# Patient Record
Sex: Male | Born: 1935 | Race: White | Hispanic: No | Smoking: Never smoker
Health system: Southern US, Community
[De-identification: ages and names within clinical notes are randomized; demographics above are authoritative.]

## PROBLEM LIST (undated history)

## (undated) DIAGNOSIS — M109 Gout, unspecified: Secondary | ICD-10-CM

## (undated) DIAGNOSIS — I10 Essential (primary) hypertension: Secondary | ICD-10-CM

## (undated) DIAGNOSIS — Z8709 Personal history of other diseases of the respiratory system: Secondary | ICD-10-CM

## (undated) DIAGNOSIS — I779 Disorder of arteries and arterioles, unspecified: Secondary | ICD-10-CM

## (undated) DIAGNOSIS — I714 Abdominal aortic aneurysm, without rupture: Secondary | ICD-10-CM

## (undated) DIAGNOSIS — Z9889 Other specified postprocedural states: Secondary | ICD-10-CM

## (undated) DIAGNOSIS — Z951 Presence of aortocoronary bypass graft: Secondary | ICD-10-CM

## (undated) DIAGNOSIS — J189 Pneumonia, unspecified organism: Secondary | ICD-10-CM

## (undated) DIAGNOSIS — I2510 Atherosclerotic heart disease of native coronary artery without angina pectoris: Secondary | ICD-10-CM

## (undated) DIAGNOSIS — Z8679 Personal history of other diseases of the circulatory system: Secondary | ICD-10-CM

## (undated) DIAGNOSIS — I739 Peripheral vascular disease, unspecified: Secondary | ICD-10-CM

## (undated) DIAGNOSIS — E785 Hyperlipidemia, unspecified: Secondary | ICD-10-CM

## (undated) DIAGNOSIS — R931 Abnormal findings on diagnostic imaging of heart and coronary circulation: Secondary | ICD-10-CM

## (undated) DIAGNOSIS — N189 Chronic kidney disease, unspecified: Secondary | ICD-10-CM

## (undated) HISTORY — DX: Hyperlipidemia, unspecified: E78.5

## (undated) HISTORY — DX: Personal history of other diseases of the circulatory system: Z86.79

## (undated) HISTORY — DX: Pneumonia, unspecified organism: J18.9

## (undated) HISTORY — DX: Abdominal aortic aneurysm, without rupture: I71.4

## (undated) HISTORY — DX: Peripheral vascular disease, unspecified: I73.9

## (undated) HISTORY — DX: Presence of aortocoronary bypass graft: Z95.1

## (undated) HISTORY — DX: Atherosclerotic heart disease of native coronary artery without angina pectoris: I25.10

## (undated) HISTORY — DX: Gout, unspecified: M10.9

## (undated) HISTORY — DX: Disorder of arteries and arterioles, unspecified: I77.9

## (undated) HISTORY — DX: Other specified postprocedural states: Z98.890

## (undated) HISTORY — DX: Essential (primary) hypertension: I10

## (undated) HISTORY — DX: Abnormal findings on diagnostic imaging of heart and coronary circulation: R93.1

## (undated) HISTORY — DX: Personal history of other diseases of the respiratory system: Z87.09

---

## 1999-03-04 ENCOUNTER — Encounter: Payer: Self-pay | Admitting: Emergency Medicine

## 1999-03-04 ENCOUNTER — Inpatient Hospital Stay (HOSPITAL_COMMUNITY): Admission: EM | Admit: 1999-03-04 | Discharge: 1999-03-07 | Payer: Self-pay | Admitting: Emergency Medicine

## 2001-02-02 DIAGNOSIS — Z951 Presence of aortocoronary bypass graft: Secondary | ICD-10-CM

## 2001-02-02 HISTORY — PX: CORONARY ARTERY BYPASS GRAFT: SHX141

## 2001-02-02 HISTORY — DX: Presence of aortocoronary bypass graft: Z95.1

## 2001-06-11 ENCOUNTER — Inpatient Hospital Stay (HOSPITAL_COMMUNITY): Admission: EM | Admit: 2001-06-11 | Discharge: 2001-06-19 | Payer: Self-pay

## 2001-07-13 ENCOUNTER — Encounter: Payer: Self-pay | Admitting: *Deleted

## 2001-07-15 ENCOUNTER — Ambulatory Visit (HOSPITAL_COMMUNITY): Admission: RE | Admit: 2001-07-15 | Discharge: 2001-07-15 | Payer: Self-pay | Admitting: *Deleted

## 2001-10-14 ENCOUNTER — Inpatient Hospital Stay (HOSPITAL_COMMUNITY): Admission: EM | Admit: 2001-10-14 | Discharge: 2001-10-17 | Payer: Self-pay | Admitting: Internal Medicine

## 2001-10-16 ENCOUNTER — Encounter: Payer: Self-pay | Admitting: Internal Medicine

## 2002-02-06 ENCOUNTER — Encounter: Admission: RE | Admit: 2002-02-06 | Discharge: 2002-02-06 | Payer: Self-pay | Admitting: *Deleted

## 2002-02-06 ENCOUNTER — Encounter: Payer: Self-pay | Admitting: *Deleted

## 2003-02-03 DIAGNOSIS — I714 Abdominal aortic aneurysm, without rupture, unspecified: Secondary | ICD-10-CM

## 2003-02-03 HISTORY — DX: Abdominal aortic aneurysm, without rupture: I71.4

## 2003-02-03 HISTORY — DX: Abdominal aortic aneurysm, without rupture, unspecified: I71.40

## 2003-02-03 HISTORY — PX: ABDOMINAL AORTIC ANEURYSM REPAIR: SUR1152

## 2003-03-01 ENCOUNTER — Encounter: Admission: RE | Admit: 2003-03-01 | Discharge: 2003-03-01 | Payer: Self-pay | Admitting: *Deleted

## 2003-12-27 ENCOUNTER — Ambulatory Visit: Payer: Self-pay | Admitting: Cardiology

## 2003-12-27 ENCOUNTER — Encounter (INDEPENDENT_AMBULATORY_CARE_PROVIDER_SITE_OTHER): Payer: Self-pay | Admitting: *Deleted

## 2003-12-27 ENCOUNTER — Inpatient Hospital Stay (HOSPITAL_COMMUNITY): Admission: EM | Admit: 2003-12-27 | Discharge: 2004-01-04 | Payer: Self-pay | Admitting: Emergency Medicine

## 2004-01-15 ENCOUNTER — Ambulatory Visit: Payer: Self-pay | Admitting: Cardiology

## 2004-02-26 ENCOUNTER — Ambulatory Visit: Payer: Self-pay | Admitting: Cardiology

## 2004-04-24 ENCOUNTER — Ambulatory Visit: Payer: Self-pay | Admitting: Internal Medicine

## 2004-05-22 ENCOUNTER — Ambulatory Visit: Payer: Self-pay | Admitting: Cardiology

## 2004-11-05 ENCOUNTER — Ambulatory Visit: Payer: Self-pay | Admitting: Cardiology

## 2004-11-25 ENCOUNTER — Ambulatory Visit: Payer: Self-pay

## 2005-06-12 ENCOUNTER — Emergency Department: Payer: Self-pay | Admitting: Internal Medicine

## 2005-06-12 ENCOUNTER — Other Ambulatory Visit: Payer: Self-pay

## 2006-02-12 ENCOUNTER — Ambulatory Visit: Payer: Self-pay | Admitting: Cardiology

## 2007-04-16 ENCOUNTER — Emergency Department: Payer: Self-pay | Admitting: Emergency Medicine

## 2007-04-16 ENCOUNTER — Other Ambulatory Visit: Payer: Self-pay

## 2007-06-23 ENCOUNTER — Ambulatory Visit: Payer: Self-pay | Admitting: Cardiology

## 2008-04-12 ENCOUNTER — Ambulatory Visit: Payer: Medicare Other | Admitting: Surgery

## 2008-04-25 ENCOUNTER — Ambulatory Visit: Payer: Medicare Other | Admitting: Surgery

## 2008-06-26 DIAGNOSIS — I10 Essential (primary) hypertension: Secondary | ICD-10-CM | POA: Insufficient documentation

## 2008-06-26 DIAGNOSIS — I739 Peripheral vascular disease, unspecified: Secondary | ICD-10-CM

## 2008-06-26 DIAGNOSIS — I6529 Occlusion and stenosis of unspecified carotid artery: Secondary | ICD-10-CM

## 2008-06-26 DIAGNOSIS — E785 Hyperlipidemia, unspecified: Secondary | ICD-10-CM | POA: Insufficient documentation

## 2008-07-03 ENCOUNTER — Ambulatory Visit: Payer: Self-pay | Admitting: Cardiology

## 2008-12-03 DIAGNOSIS — R931 Abnormal findings on diagnostic imaging of heart and coronary circulation: Secondary | ICD-10-CM

## 2008-12-03 HISTORY — DX: Abnormal findings on diagnostic imaging of heart and coronary circulation: R93.1

## 2008-12-16 ENCOUNTER — Inpatient Hospital Stay: Payer: Medicare Other | Admitting: Internal Medicine

## 2008-12-16 ENCOUNTER — Ambulatory Visit: Payer: Self-pay | Admitting: Cardiovascular Disease

## 2008-12-16 ENCOUNTER — Encounter: Payer: Self-pay | Admitting: Cardiology

## 2008-12-17 ENCOUNTER — Encounter: Payer: Self-pay | Admitting: Cardiology

## 2008-12-20 ENCOUNTER — Ambulatory Visit: Payer: Self-pay | Admitting: Cardiology

## 2008-12-25 ENCOUNTER — Ambulatory Visit: Payer: Medicare Other | Admitting: Vascular Surgery

## 2008-12-26 ENCOUNTER — Encounter: Payer: Self-pay | Admitting: Internal Medicine

## 2009-01-02 ENCOUNTER — Inpatient Hospital Stay: Payer: Medicare Other | Admitting: Vascular Surgery

## 2009-01-04 ENCOUNTER — Telehealth: Payer: Self-pay | Admitting: Cardiology

## 2009-08-29 ENCOUNTER — Ambulatory Visit: Payer: Self-pay | Admitting: Cardiology

## 2009-08-29 DIAGNOSIS — I498 Other specified cardiac arrhythmias: Secondary | ICD-10-CM

## 2009-08-29 DIAGNOSIS — I25810 Atherosclerosis of coronary artery bypass graft(s) without angina pectoris: Secondary | ICD-10-CM

## 2010-03-06 NOTE — Assessment & Plan Note (Signed)
Summary: 1 year follow up/past due/mt   Visit Type:  1 yr f/u Referring Provider:  Dr. Daleen Squibb Primary Provider:  Dr. Lacie Scotts  CC:  sob w/walking at times...edema/ankles at times...denies any cp.  History of Present Illness: [name] returns today for evaluation of his coronary artery disease.  Since I last saw him, he presented with a left-sided stroke. He subsequent underwent right carotid endarterectomy at [facility]. His residual disease on the left side. He is being followed by Dr.Dew.   He is having no angina or ischemic symptoms. He is very compliant with medications. He continues to be remarkably active. Primary care is following his blood work.  Current Medications (verified): 1)  Pravastatin Sodium 40 Mg Tabs (Pravastatin Sodium) .... Take One Tablet By Mouth Daily At Bedtime 2)  Hydrochlorothiazide 12.5 Mg Tabs (Hydrochlorothiazide) .... Take One Tablet By Mouth Daily. 3)  Aspirin 81 Mg Tbec (Aspirin) .... Take One Tablet By Mouth Daily 4)  Plavix 75 Mg Tabs (Clopidogrel Bisulfate) .... Take One Tablet By Mouth Daily 5)  Daily Multi  Tabs (Multiple Vitamins-Minerals) .Marland Kitchen.. 1 By Mouth Once Daily 6)  Iron 325 (65 Fe) Mg Tabs (Ferrous Sulfate) .Marland Kitchen.. 1 By Mouth Once Daily 7)  Vitamin D3 400 Unit Tabs (Cholecalciferol) .Marland Kitchen.. 1 By Mouth Once Daily 8)  Fish Oil 1000 Mg Caps (Omega-3 Fatty Acids) .Marland Kitchen.. 1 By Mouth When Remember 9)  Metoprolol Tartrate 25 Mg Tabs (Metoprolol Tartrate) .... Take 1/2 Tablet By Mouth Twice A Day 10)  Doxazosin Mesylate 4 Mg Tabs (Doxazosin Mesylate) .Marland Kitchen.. 1 Tab Once Daily 11)  Lisinopril 5 Mg Tabs (Lisinopril) .Marland Kitchen.. 1 Tab Once Daily 12)  Finasteride 5 Mg Tabs (Finasteride) .Marland Kitchen.. 1 Tab Once Daily  Allergies: No Known Drug Allergies  Past History:  Past Medical History: Last updated: 12/20/2008 1. CAD s/p CABG in 2003 at [facility].  PCI 2004.  No LHC since 2004.  2. Open AAA repair in 2005. 3. CAROTID ARTERY DISEASE (ICD-433.10): Right basal ganglia CVA  11/10.  CTA neck showed 90% RICA stenosis, 75-80% LICA stenosis.  4.  Echo (11/10): Mild LVH, EF 55%, mild AI, diastolic dysfunction.  5.  History of orthostatic hypotension.  6. HYPERTENSION, UNSPECIFIED (ICD-401.9) 7. HYPERLIPIDEMIA-MIXED (ICD-272.4)    Past Surgical History: Last updated: 08/27/2009 CABG...2003 AAA repair...2005  Family History: Last updated: 08/27/2009 Family History of Coronary Artery Disease:  Family History of Diabetes:   Social History: Last updated: 08/27/2009 Lives alone, does all housework and yardwork.  Retired from [employer].  Son lives nearby.   Tobacco Use - No.  Drug Use - no Alcohol Use - no  Risk Factors: Smoking Status: never (06/26/2008)  Review of Systems       negative other history of present illness  Vital Signs:  Patient profile:   77 year old male Height:      68 inches Weight:      182 pounds BMI:     27.77 Pulse rate:   51 / minute Pulse rhythm:   irregular BP sitting:   158 / 80  (left arm) Cuff size:   large  Vitals Entered By: Danielle Rankin, CMA (August 29, 2009 10:27 AM)  Physical Exam  General:  elderly, in no acute distress Head:  normocephalic and atraumatic Eyes:  glasses otherwise normal Neck:  Neck supple, no JVD. No masses, thyromegaly or abnormal cervical nodes. Lungs:  Clear bilaterally to auscultation and percussion. Heart:  PMI nondisplaced, regular rate and rhythm, normal S1-S2, right carotid  bruit and left carotid bruit Abdomen:  midline incision, positive bowel sounds, no bruit Msk:  decreased ROM.   Pulses:  pulses normal in all 4 extremities Extremities:  trace left pedal edema and trace right pedal edema.   Neurologic:  Alert and oriented x 3. Skin:  Intact without lesions or rashes. Psych:  Normal affect.   Problems:  Medical Problems Added: 1)  Dx of Bradycardia, Chronic  (ICD-427.89) 2)  Dx of Cad, Artery Bypass Graft  (ICD-414.04)  EKG  Procedure date:   08/29/2009  Findings:      sinus bradycardia, ST segment changes laterally, stable.  Impression & Recommendations:  Problem # 1:  CAD, ARTERY BYPASS GRAFT (ICD-414.04) Assessment Unchanged  He is stable. Medications reviewed. Symptoms of cardiac decompensation reviewed with he and his son. No changes made. I'll see him back in a year. The following medications were removed from the medication list:    Mavik 2 Mg Tabs (Trandolapril) .Marland Kitchen... 1 by mouth once daily His updated medication list for this problem includes:    Aspirin 81 Mg Tbec (Aspirin) .Marland Kitchen... Take one tablet by mouth daily    Plavix 75 Mg Tabs (Clopidogrel bisulfate) .Marland Kitchen... Take one tablet by mouth daily    Metoprolol Tartrate 25 Mg Tabs (Metoprolol tartrate) .Marland Kitchen... Take 1/2 tablet by mouth twice a day    Lisinopril 5 Mg Tabs (Lisinopril) .Marland Kitchen... 1 tab once daily  Orders: EKG w/ Interpretation (93000)  Problem # 2:  CAROTID ARTERY DISEASE (ICD-433.10)  His updated medication list for this problem includes:    Aspirin 81 Mg Tbec (Aspirin) .Marland Kitchen... Take one tablet by mouth daily    Plavix 75 Mg Tabs (Clopidogrel bisulfate) .Marland Kitchen... Take one tablet by mouth daily  Problem # 3:  PVD (ICD-443.9) Assessment: Unchanged  Problem # 4:  BRADYCARDIA, CHRONIC (ICD-427.89) Assessment: Unchanged he That is asymptomatic. We'll continue very low dose beta blocker. The following medications were removed from the medication list:    Mavik 2 Mg Tabs (Trandolapril) .Marland Kitchen... 1 by mouth once daily His updated medication list for this problem includes:    Aspirin 81 Mg Tbec (Aspirin) .Marland Kitchen... Take one tablet by mouth daily    Plavix 75 Mg Tabs (Clopidogrel bisulfate) .Marland Kitchen... Take one tablet by mouth daily    Metoprolol Tartrate 25 Mg Tabs (Metoprolol tartrate) .Marland Kitchen... Take 1/2 tablet by mouth twice a day    Lisinopril 5 Mg Tabs (Lisinopril) .Marland Kitchen... 1 tab once daily  Patient Instructions: 1)  Your physician recommends that you schedule a follow-up appointment  in: YEAR WITH DR Dartanyon Frankowski 2)  Your physician recommends that you continue on your current medications as directed. Please refer to the Current Medication list given to you today.

## 2010-06-17 NOTE — Assessment & Plan Note (Signed)
[facility] OFFICE NOTE   LAZARUS, SUDBURY                      MRN:          [id number]  DATE:07/03/2008                            DOB:          [birth year]    Mr. Halpin comes today for followup.   He last saw me on Jun 23, 2007.  Since that time, he is turned 77 year of  age and is still fairly active.  He is having no symptoms of angina or  ischemia.  He has had no symptoms of TIAs or mini strokes.  He denies  any claudication.   See problem list from Jun 23, 2007.   He recently has his carotid Doppler by Dr. Lacie Scotts, who has been  followed this on an annual basis.  He remains both on Plavix and  aspirin.   When carefully questioned, he does occasionally get a little lightheaded  when he stands up.  He has had a history of orthostatic hypotension.   His medications are listed on the maintenance medication list.  Compared  to last visit he is now off of amlodipine.  He remains on:  1. Pravastatin 40 mg p.o. daily.  2. He is now on HCTZ 12.5 mg a day.  3. Mavik 2 mg a day.  4. Plavix 75 mg a day.  5. Aspirin 81 mg a day.   REVIEW OF SYSTEMS:  Otherwise is negative.   PHYSICAL EXAMINATION:  VITAL SIGNS:  His blood pressure is 116/70.  His  pulse is 66 and regular.  He has sinus rhythm on his EKG with a left  bundle, which we saw last time.  His weight is 181.1.  HEENT:  Unremarkable and unchanged.  NECK:  Supple.  He has a soft carotid bruit on the right.  Left is  negative.  Neck is supple.  There is no obvious JVD.  CHEST:  Lungs are clear to auscultation and percussion.  HEART:  Soft systolic murmur along the left sternal border.  His S2 is  paradoxically split.  ABDOMEN:  Soft.  Good bowel sounds.  No midline bruit.  No hepatomegaly.  EXTREMITIES:  No significant cyanosis.  He has posterior tibialis at a  1+/4+.  Dorsalis pedis were hard to feel.  He has trace pedal edema.  There is no sign  of DVT.  NEUROLOGIC:  Alert and oriented x3, is remarkably mobile for a 32-year-  old gentleman.   ASSESSMENT AND PLAN:  Mr. Savoca continues to do well.  I have made no  changes in his medical program.  We will plan on seeing him back in a  year.     Thomas C. Daleen Squibb, MD, [facility]  Electronically Signed    TCW/MedQ  DD: 07/03/2008  DT: 07/04/2008  Job #: [id number]   cc:   Evelene Croon

## 2010-06-17 NOTE — Assessment & Plan Note (Signed)
[facility] OFFICE NOTE   MEHTAAB, MAYEDA                      MRN:          [id number]  DATE:06/23/2007                            DOB:          May 27, 1912    Mr. Saladin comes in today.  He is 77 years old and is doing remarkably  well.   PROBLEM LIST:  1. Coronary artery disease.  He is on medical therapy.  He is having      no significant angina.  He says when he pushes his self a little      hard, he might get a little tightness.  He has not had to use any      nitroglycerin.  2. Peripheral vascular disease, status post urgent abdominal aortic      aneurysm resection, November 2005.  Last ultrasound in November      2006, no evidence of residual aneurysm.  He is having no      claudication or abdominal pain.  3. Carotid artery disease.  Dr. Lacie Scotts does ultrasounds on him on an      annual basis.  He is having no symptoms of TIAs or strokes.  4. Hyperlipidemia.  He has had blood work done and said his blood look      good.  5. Hypertension with a history of orthostatic hypotension.  He is not      having any symptoms of orthostatic static changes now.   MEDICINES:  1. Mavik 2 mg a day.  2. Plavix 75 mg a day.  3. Aspirin 81 mg a day.  4. Iron.  5. Amlodipine 5 mg a day.  6. Pravastatin 40 mg a day.  7. Doxazosin 4 mg a day.  8. Cosopt drops to both eyes.  9. Finasteride 5 mg a day.   PHYSICAL EXAMINATION:  VITAL SIGNS:  His blood pressure is 144/80, his  pulse is 59 and sinus brady.  His EKG shows a new left bundle.  He is  totally asymptomatic with this, denying any presyncope or syncope or  tachy palpitations.  His weight is 187 and stable.  GENERAL:  He looks much younger than his stated age.  HEENT:  Arcus senilis.  Facial symmetry is normal.  NECK:  Carotids upstrokes are equal bilaterally with soft systolic  sounds versus bruits.  Thyroid is not enlarged.  Trachea is midline.  LUNGS:   Clear.  HEART:  Reveals a paradoxically split S2.  There is no gallop.  ABDOMEN:  Soft.  He has a midline hernia from his aneurysm scar.  There  is no tenderness.  There is no pulsatile masses.  No obvious  organomegaly.  EXTREMITIES:  Reveal pulses to be 2+/4+ bilaterally and symmetrical.  He  has trivial edema.  NEURO:  Intact.   ASSESSMENT/PLAN:  Mr. Mabus is doing well.  The only change I see today  is a new left bundle branch block, which is asymptomatic.  He is on an  excellent medical program at this point in his medical therapy.  I have  made no changes in  his medical program.  I do note that his Carvedilol  has been stopped since his last visit here.  Perhaps, this was done by  Dr. Lacie Scotts.  I certainly think this is reasonable considering the new  left bundle and the fact that he is doing so well clinically.  I will  see him back in a year.     Thomas C. Daleen Squibb, MD, [facility]  Electronically Signed    TCW/MedQ  DD: 06/23/2007  DT: 06/23/2007  Job #: [id number]   cc:   Evelene Croon, MD

## 2010-06-20 NOTE — Discharge Summary (Signed)
NAME:  [name], [name]                         ACCOUNT NO.:  [id number]   MEDICAL RECORD NO.:  [id number]                   PATIENT TYPE:  INP   LOCATION:  4704                                 FACILITY:  [facility]   PHYSICIAN:  Pricilla Riffle, M.D. [facility]             DATE OF BIRTH:  [birth year]   DATE OF ADMISSION:  10/14/2001  DATE OF DISCHARGE:  10/17/2001                                 DISCHARGE SUMMARY   DISCHARGE DIAGNOSES:  1. Chest pain, status post cardiac catheterization, status post nuclear     stress test.  2. Hyperlipidemia.  3. Hypertension.  4. Benign prostatic hypertrophy.  5. Abdominal aortic aneurysm, 6.5 cm by angiography.   HOSPITAL COURSE:  The patient is a very pleasant 77 year old male with known  coronary artery disease.  On the day of admission, he was in bed awake.  He  had onset of substernal chest pain.  The pain was unrelieved by three  sublingual nitroglycerin.  EMS was called.  He was given additional  sublingual nitroglycerin which also failed to relieve the pain.  He was  transported to [facility], where the patient states  the pain was relieved shortly after starting IV nitroglycerin.  The patient  was subsequently transferred to [facility] for further  evaluation.   The patient was seen and admitted by Dr. Pricilla Riffle.  She felt that given  the patient's symptoms and history that the patient should undergo cardiac  catheterization.   That day, the patient was taken to the catheter lab by Dr. Salvadore Farber.  He noted that the patient had severe native coronary artery  disease.  The LIMA to LAD was atretic.  The saphenous vein graft to the  obtuse marginal branch was severely diseased with TIMI III.  He noted that  this vessel had prior been deemed nonrevascularizable by Dr. Ines Bloomer and Dr. Arturo Morton. Stuckey.  This time, he felt that the disease had  progressed.  He also found that there was  moderate to severe disease in the  saphenous vein graft to the first diagonal, which was new since the last  catheterization.  We then performed angioplasty and cutting balloon which  produced no residual stenosis in TIMI III flow.  Given the patient's active  life-style, Dr. Salvadore Farber felt that Cardiolite study to further  guide any potential revascularization was indicated.   On October 16, 2001, the patient underwent Adenosine-Cardiolite stress  test.  Nuclear imaging revealed an ejection fraction of 50% ischemia.  There  was also noted to be an anterior scar.  After the procedure, the patient  complained of some right forearm itching and burning just proximal to the  side of the saline lock.  After consultation with both nuclear medicine and  pharmacy, it was felt that only symptomatic treatment with cold packs was  necessary.   On October 17, 2001, the patient has seen Dr. Jesse Sans. Wall.  The patient  was doing well, had no further chest pain, and Dr. Maisie Fus C. Wall felt he  was stable for discharge.   DISCHARGE MEDICATIONS:  1. Lipitor 10 mg q.h.s.  2. Flomax 0.4 mg q.h.s.  3. Enteric-coated aspirin 325 mg q.d.  4. Coreg 3.125 mg q.12h.  5. Mavik 4 mg q.d.  6. Plavix 75 mg q.d.   LABORATORY DATA:  Sodium 138, potassium 3.8, chloride 108, CO2 23, BUN 11,  creatinine 1.0, glucose 122.  White count 6.9, hemoglobin 12.1, hematocrit  36.3, platelets 110,000.   Electrocardiogram shows sinus tachycardia, PR interval 158, QRS 116, QTC  436, axis 54.  There was also noted left axis deviation as well as left  anterior wall hypertrophy with repolarization changes.   DISCHARGE INSTRUCTIONS:  No heavy lifting or tub baths for one week.  Follow  a low fat, cholesterol diet.  He is to watch the catheterization site for  any pain bleeding or swelling and call the [place] office for any of these  problems.  He is to follow up with Dr. Maisie Fus C. Wall on Thursday, October 27, 2001 and the office will give him a time.  He is to follow up with Dr.  Balinda Quails as needed as scheduled.  He is to follow up Dr. Evelene Croon as needed or as scheduled.       Annett Fabian, P.A. [facility]                  Pricilla Riffle, M.D. [facility]    CKM/MEDQ  D:  10/17/2001  T:  10/17/2001  Job:  [id number]   cc:   Thomas C. Daleen Squibb, M.D. [facility]   P. Liliane Bade, M.D.  [address]  [place]  [place] [address]  Fax: [phone]   [place] Lacie Scotts

## 2010-06-20 NOTE — Discharge Summary (Signed)
[place]  Patient:    [name], [name]                        MRN: [id number] Adm. Date:  03/04/99 Disc. Date: 03/07/99 Attending:  Jesse Sans. Daleen Squibb, M.D. [facility] Dictator:   Delton See, P.A. CC:         Jesse Sans. Wall, M.D. [facility]                  Referring Physician Discharge [place]  HISTORY:  [name] is an 77 year old male who presented to [facility] for evaluation of chest pain.  He has a history of coronary artery bypass graft surgery performed at [facility] in 1995.  The patient had done well following his CABG until approximately one week prior to this admission when he was visiting his daughter in [place] and developed chest pain.  He came back to [place], went to [facility] Emergency Room where he was noted to have chest pain and hypertension.  He was released after three hours.  He saw Dr. Daleen Squibb on Tuesday.  His Altace was increased from 5 mg to 10 mg daily.  He was scheduled to have a stress test at [facility] on March 04, 1999; however, he awoke that morning and developed chest pain while walking out to pick up his newspaper.  He took aspirin and nitroglycerin.  The pain resolved after 30 minutes.  He called Dr. Daleen Squibb and was told to come to [facility] Emergency Room.  He was admitted for further evaluation.  PAST MEDICAL HISTORY:  Significant for borderline hypertension and coronary artery bypass graft surgery in 1995.  He has BPH.  He had a tonsillectomy as a child.  ALLERGIES:  No known drug allergies.  He has dizziness with PRAVACHOL.  SOCIAL HISTORY:  The patient is married.  He lives in [place].  He has four children.  He does not use alcohol or tobacco.  HOSPITAL COURSE:  As noted, this patient was admitted to [facility] March 04, 1999 for evaluation of chest pain.  He underwent ______ surgery on March 04, 1999 performed by Dr. Graceann Congress.  His left main  was normal.  His LAD had an 80% lesion.  The diagonal had a 99% lesion. Circumflex had a 95% proximal lesion.  The RCA was a dominant vessel with a 50% lesion as well as a 90% lesion and a 50% in a posterior lateral branch. The LIMA to the LAD was atrophic and was not selectively engaged despite using a LIMA catheter.  The saphenous vein graft to the diagonal 1 and diagonal 2 refilled a 99% lesion.  The saphenous vein graft to OM 1 and OM 2 had a 90% lesion and was irregular proximally.  The left ventricle appeared to be normal.  It was felt that the patient had three vessel coronary disease.  He was having continued pain.  It was felt that the culprit lesion was the saphenous vein graft to the diagonal.  A decision was made to proceed with percutaneous coronary angioplasty.  On March 06, 1999 the patient was taken back to the catheterization laboratory by Dr. Juanda Chance.  A stent was placed in the PLA reducing the lesion from 90% to 0%.  A stent was placed in the saphenous vein graft to the obtuse marginal reducing the lesion from 80% to less than 10%.  The patient tolerated procedures well.  Arrangements were made to discharge the patient on ______ in improved condition.  It was also noted the patient had an abnormal chest x-ray at time of admission.  This showed cardiomegaly with probable left atrial enlargement and an enlarged and tortuous aorta.  It was recommended that this be compared to previous films.  LABORATORIES:  Cardiac enzymes were negative.  Lipid profile revealed cholesterol 140, triglycerides 84, HDL 24, LDL 99.  Chemistries were unremarkable and on the day of discharge a BUN was 14, creatinine 0.8, potassium 4.2.  A CBC on the day of discharge revealed hemoglobin 12.7, hematocrit 37.7, WBC 7.7, platelet count 143,000.  DISCHARGE MEDICATIONS: 1. As noted, Plavix was added 75 mg one each day for four weeks. 2. Altace 10 mg daily. 3. Lipitor 10 mg daily. 4. Flomax as  previously taken. 5. Aspirin daily. 6. Nitroglycerin p.r.n.  The patient was told to avoid any heavy lifting for three days.  He was to follow up with Dr. Daleen Squibb at the [place] office.  He was to follow up with bl as scheduled or as needed.  PROBLEM LIST AT TIME OF DISCHARGE: 1. Chest pain, myocardial infarction ruled out. 2. Cardiac catheterization March 04, 1999 revealing diffuse disease. 3. Status post percutaneous transluminal coronary angioplasty of the    posterolateral artery and the saphenous vein graft to the obtuse marginal    performed March 06, 1999. 4. Normal left ventricular function at time of catheterization. 5. History of coronary artery bypass graft surgery in 1995. 6. Borderline hypertension. 7. Benign prostatic hypertrophy. 8. Mildly abnormal chest x-ray as noted above. DD:  07/29/99 TD:  07/29/99 Job: [id number]

## 2010-06-20 NOTE — Consult Note (Signed)
[place]. [facility]  Patient:    [name], [name] Visit Number: [id number] MRN: [id number]          Service Type: MED Location: [phone] Attending Physician:  Nelta Numbers Dictated by:   Denman George, M.D. Proc. Date: 06/15/01 Admit Date:  06/11/2001 Discharge Date: 06/19/2001   CC:         Arturo Morton. Riley Kill, M.D. [facility]  Dr. Glenis Smoker   Consultation Report  REFERRING PHYSICIAN:  Arturo Morton. Riley Kill, M.D.  REASON FOR CONSULTATION:  A 5.5 cm infrarenal abdominal aortic aneurysm.  SUMMARY OF HISTORY:  This is an 77 year old white male who was admitted to [facility] with acute coronary syndrome on Jun 11, 2001. The patient presented with chest pain which was initially unresponsive to nitroglycerin.  The patient was admitted to [facility], placed in the Coronary Care Unit, intravenous heparin, and nitroglycerin drip. His pain resolved.  He was taken to the Cardiac Catheterization lab on Jun 14, 2001, and underwent diagnostic left heart catheterization. The patient is status post coronary artery bypass in 1995. 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  During cardiac catheterization, the patient underwent an aortogram. This revealed a large infrarenal abdominal aortic aneurysm. An ultrasound was performed today sizing the aneurysm at 5.5 cm in maximal diameter.  The patient has been free of any significant abdominal pain or back pain.  PAST MEDICAL HISTORY:  1. Coronary artery disease, status post coronary artery bypass, 1995.  2. Benign prostatic hypertrophy.  3. Hyperlipidemia.  4. Hypertension.  5. Remote tonsillectomy.  MEDICATIONS:  1. ASA 325 mg daily.  2. Flomax 0.4 mg daily.  3. Altace 10 mg daily.  4. Protonix 40 mg daily.  5. Lipitor 10 mg daily.  6. Alphagan 1 drop OU b.i.d.  7. Valium 5 mg  p.o. p.r.n.  8. Benadryl 25 mg IV p.r.n.  9. Coreg 3.125 mg p.o. b.i.d. 10. Heparin intravenous drip. 11. Nitroglycerin intravenous drip. 12. Morphine sulfate 4-6 mg IV q.1h. p.r.n. 13. Ambien 5 mg p.o. q.h.s. p.r.n. 14. Milk of Magnesia 30 mL p.o. p.r.n. 15. Dulcolax suppository 1 per rectum p.r.n. 16. Tylenol 650 mg q.4-6h. p.r.n.  ALLERGIES:  Adverse reaction to PRAVACHOL.  FAMILY HISTORY:  Mother is deceased at age 76 without history of coronary artery disease. Father deceased at age 22, accidental death. He has one son who suffered a myocardial infarction at age 62, one son with coronary artery disease status post PT percutaneous intervention, and a third son also with coronary artery disease. One daughter is deceased with amyloidosis.  SOCIAL HISTORY:  The patient is married and lives with his wife in [place]. He has four living children. He denies smoking or drug use. He drinks alcohol only occasionally.  REVIEW OF SYSTEMS:  The patient has occasional sinus headache. Denies cough or sputum production. No history of dizziness, sensory, motor, or visual deficit. Denies abdominal pain, nausea, vomiting, constipation, or diarrhea. No dysuria or frequency. No lower extremity pain.  PHYSICAL EXAMINATION:  GENERAL:  An 77 year old white male who is alert and oriented and appears generally well and appropriate for age.  VITAL SIGNS:  Blood pressure is 105/60, pulse is 75 per minute and regular, respirations 12 per minute. O2 saturation 92% on room air.  HEENT:  Mouth and throat are clear. Pupils are equal and responsive to light. No scleral icterus. Extraocular movements intact.  CARDIOVASCULAR:  No carotid bruits. Normal heart sounds, 1/6 systolic ejection murmur.  CHEST:  Clear to auscultation and percussion, equal air entry bilaterally.  ABDOMEN:  Soft and nontender. No organomegaly. AAA palpable without tenderness. No abdominal bruits, normal bowel sounds.  LOWER  EXTREMITIES:  With 2+ femoral popliteal pulses bilaterally, 2+ dorsalis pedis and posterior tibia pulses bilaterally. No peripheral edema.  NEUROLOGIC:  The patient is alert and oriented. Moves all extremities to command. Sensation intact.  INVESTIGATIONS:  Abdominal ultrasound reveals a 5.5 cm infrarenal abdominal aortic aneurysm.  IMPRESSION:  A 5.5 cm asymptomatic infrarenal abdominal aortic aneurysm.  RECOMMENDATIONS:  The patient is scheduled to undergo percutaneous intervention for recurrent coronary artery disease. Following planned intervention, will schedule for a CT scan with stent graft protocol for evaluation for of abdominal aortic aneurysm.  Hopefully, the patient will be a candidate for stent graft taking into account his age and underlying comorbidities. Dictated by:   Denman George, M.D. Attending Physician:  Nelta Numbers DD:  06/15/01 TD:  06/18/01 Job: [id number]

## 2010-06-20 NOTE — Op Note (Signed)
[place]. [facility]  Patient:    [name]                       MRN: [id number] Proc. Date: 03/04/99 Adm. Date:  [id number] Attending:  Mirian Mo CC:         Jesse Sans. Wall, M.D. [facility]             E. Graceann Congress, M.D. [facility]             Bruce R. Juanda Chance, M.D. [facility]             Dr. Glenis Smoker             Cardiopulmonary Lab                           Operative Report  INDICATIONS:  Mr. Baswell is 77 years old and had a previous bypass in 1995 by Dr. Laneta Simmers.  He was recently seen by Dr. Daleen Squibb with increasing symptoms of chest  pain; arrangements were made for evaluation with catheterization.  Dr. Corinda Gubler performed a diagnostic study immediately prior to this and found: an 80% narrowing in the proximal LAD and total occlusion of the first diagonal branch; 90% stenosis in the proximal circumflex, with competing flow in the marginal vessels; 90% stenosis in the large posterolateral branch of the right coronary artery;  90% stenosis in the saphenous vein graft to the first and second diagonal branches f the LAD with TIMI-1 flow; 80% stenosis in the sequential vein graft to the first and second marginal branches of the circumflex artery, with diffuse degeneration in the vein graft; and an atrophic LIMA graft with normal LV function.  Dr. Corinda Gubler and I felt the best option was to perform a culprit intervention on the saphenous vein graft to the first and second diagonal branches of the LAD.  To perform stage intervention for the other vein graft and the distal right coronary artery, and possibly the LAD.  PROCEDURE:  The procedure was performed by the right femoral artery and arterial sheath and a 6-French AL1 guiding catheter.  The patient was given weight-adjusted heparin upon the ACT greater than 200 sec, and was given double bolus Integrilin and infusion.  We were able to cross the lesion with a short floppy wire without difficulty.  After  multiple doses of intracoronary Verapamil, we used a 2.5 x 20 mm CrossSail and performed one dilatation of 6 atm for 32 sec.  We then deployed an 3.0 x 18 mm Tetra stent, with one inflation of 10 atm for 48 sec.  Multiple doses of intracoronary Verapamil was given, but the patients flow improved after stent implantation and we were able to visualize two distal diagonal branches (these ere fairly good-sized vessels).  The patient tolerated the procedure well and left he laboratory in satisfactory condition.  At the end of the procedure we closed the right femoral artery with Perclose.  RESULTS: 1. Coronary artery stent plus coronary artery bypass graft surgery in 1995    with severe disease in the native vessels and all three grafts, as described    above and as documented by Dr. Corinda Gubler. 2. Successful stenting of the sequential saphenous vein graft to the first and    second diagonal branches of the LAD, with improvement in the percent luminal  narrowing from 90% to 0%.  Also improvement in the flow from TIMI-1 to TIMI-3  flow.  DISPOSITION:  The patient was returned to PACU for further observation.  We will plan intervention on the circumflex graft and the distal right coronary artery, and possibly the LAD on Thursday. DD:  03/04/99 TD:  03/05/99 Job: [id number]

## 2010-06-20 NOTE — Op Note (Signed)
[place]. [facility]  Patient:    [name], [name] Visit Number: [id number] MRN: [id number]          Service Type: DSU Location: [facility] 2899 [id number] Attending Physician:  Melvenia Needles Dictated by:   Denman George, M.D. Proc. Date: 07/15/01 Admit Date:  07/15/2001 Discharge Date: 07/15/2001   CC:         Thomas C. Wall, M.D. [facility]  [place] Peripheral Catheterization Lab   Operative Report  PREOPERATIVE DIAGNOSIS:  A 6.5 cm infrarenal abdominal aortic aneurysm.  POSTOPERATIVE DIAGNOSIS:  A 6.5 cm infrarenal abdominal aortic aneurysm.  OPERATION:  Abdominal aortogram with pelvic arteriogram.  SURGEON:  Denman George, M.D.  ACCESS:  Right common femoral artery with 5-French sheath.  CONTRAST:  90 ml Visipaque.  COMPLICATIONS:  None apparent.  CLINICAL NOTE:  This is an 77 year old male with known advanced coronary disease.  He was recently found to have a large abdominal aortic aneurysm.  By CT scan, this measures 6.5 cm.  His evaluation has revealed a large aortic neck measuring 3 cm in diameter.  He is brought to the catheterization lab at this time for diagnostic arteriography.  DESCRIPTION OF PROCEDURE:  The patient was brought to the catheterization lab in stable condition.  Placed in the supine position.  Informed consent obtained.  Both groins prepped and draped in a sterile fashion.  Skin and subcutaneous tissue in the right groin instilled with 1% Xylocaine, administered 2 mg Nubain, 2 mg Versed intravenously.  Needle was easily introduced in the right common femoral artery.  A 0.035 J wire was passed through the needle into the mid abdominal aorta.  A 5-French sheath was advanced over the guidewire into the right common femoral artery. The dilator was removed.  Sheath flushed with heparin and saline solution.  A graduated pigtail catheter was then advanced over the guidewire into the mid abdominal aorta.  The guidewire  removed.  Standard AP abdominal aortogram was obtained.  This revealed single widely patent bilateral renal arteries. Marked tortuosity in the infrarenal aorta noted with right lateral angulation. There was a 2 to 2.5 cm infrarenal neck.  The common iliac arteries bilaterally revealed ectasia but no dominant stenosis.  A lateral aortogram was obtained.  This again revealed a large abdominal aortic aneurysm.  Brisk flow through the superior mesenteric artery noted.  The pigtail catheter brought down to the aortic bifurcation.  AP and oblique pelvic arteriograms were obtained.  The common iliac arteries were widely patent bilaterally.  The internal and external iliac arteries also were widely patent bilaterally with continuous flow to the level of the common femoral artery bilaterally.  The patient tolerated the procedure well.  No apparent complications.  The guidewire was reinserted, and the catheter removed along with the guidewire. The patient was transferred to the holding area.  Right femoral sheath removed.  No apparent complications.  FINAL IMPRESSION: 1. A 6.5 cm infrarenal abdominal aortic aneurysm. 2. Marked tortuosity and angulation of the infrarenal neck. 3. Normal iliac arteries bilaterally. Dictated by:   Denman George, M.D. Attending Physician:  Melvenia Needles DD:  07/15/01 TD:  07/17/01 Job: [id number]

## 2010-06-20 NOTE — Procedures (Signed)
[name]. [name]  Patient:    [name], TALLO Visit Number: [id number] MRN: [id number]          Service Type: MED Location: [phone] Attending Physician:  Nelta Numbers Dictated by:   Arturo Morton Riley Kill, M.D. [facility] Admit Date:  06/11/2001 Discharge Date: 06/19/2001   CC:         Thomas C. Wall, M.D. [facility]  Cardiac Catheterization Lab   Procedure Report  INDICATIONS:  The patient is a delightful 77 year old who previously underwent revascularization surgery.  He presented with an acute infarction and subsequently underwent repeat cardiac catheterization.  This revealed overall preserved LV function with atresia of the left internal mammary due to adequate flow down the left anterior descending which was a smaller caliber vessel.  There was severe disease of the vein graft to the marginal and significant disease of the vein graft to the sequential diagonal branches. The marginal graft was diffusely diseased and not very favorable for intervention due to multiple lesions.  The diagonal had a more focal lesion. The patient also has a large abdominal aortic aneurysm and has been seen by Dr. Madilyn Fireman.  Because of his age, he was not felt to be a good re-do surgical candidate and it was felt that an attempt should be given to at least opening a diagonal graft which now has a high-grade stenosis which is more focal.  I also reviewed the films with Dr. Juanda Chance and we considered dilating the AV circumflex if the vein graft went smoothly.  In addition, we have given some thought to dilating the marginal but it is so diffusely diseased, it was felt that the risk of distal embolization was high and it would be difficult to protect.  PROCEDURE:  Percutaneous stenting of the saphenous vein graft to the sequential diagonal branches.  DESCRIPTION OF PROCEDURE:  The patient was brought to the catheterization lab and prepped and draped in the usual  fashion.  Through an anterior puncture, the right femoral artery was entered.  We chose a #7 [place] sheath and a #7 [place] left coronary bypass graft catheter with sideholes.  We also elected t give Verapamil.  We chose Angiomax because of the patients thrombocytopenia. After adequate heparinization, the lesion in the mid portion of the graft was crossed with the Medtronic Guard-Wire.  The stent was then taken to the distal portion of the guiding catheter for preparations for deployment.  The Guard-Wire was then inflated successfully and there was occlusion of distal flow.  The 3.0 x 18-mm Zeta stent was then taken to the lesion site and inflated at 13 atmospheres.  There was marked ST elevation and chest pain during this time.  We then attempted to pass the Export catheter but we were unable to pass this through a previously placed stent in the proximal portion of the graft.  It kept getting hung up on this site.  Meanwhile, the patients chest pain continued to get worse.  We ultimately had to let the distal protection balloon down and with this, there was flow back down the graft into the diagonal but slow flow into the distal limb of the graft.  There was also a hazy density proximal to the stent site.  We again attempted to pass the Export catheter into this area but again were unable to pass across the stent that had been placed in a previous procedure.  As a result of this, we then used a double wire technique and  with the double wire technique, we were eventually able to get the Export catheter down but with some fair amount of difficulty.  We gave generous doses of intracoronary Verapamil to try to improve distal flow.  After 2 passes with the Export catheter, there continued to remain a hazy density, and we were unclear whether this represented edge tear or some other mechanism.  An overlapping hepacoat stent, 3.0 x 13-mm, was then placed across this area and there was marked  improvement in the appearance of the artery.  With time, flow gradually improved along with ST resolution.  The patient remained stable and subsequently all catheters were removed and the femoral sheath sewn into place.  He was taken over to the step-down unit in satisfactory clinical condition.  ANGIOGRAPHIC DATA:  The saphenous vein graft to the diagonal branch demonstrates about an 80% stenosis in the distal portion of the body of the graft.  More proximally, there is a previously placed stent with about 20% narrowing.  The graft inserts into 2 somewhat diffusely irregular diagonal branches sequentially.  Following the stent placement, the stenosis was reduced to 0%.  Flow was eventually opened into these other areas.  There was a good final angiographic appearance.  DISPOSITION:  We elected not to do the second lesion given the difficulties with no re-flow today.  He will be treated with aspirin and Plavix and given his age, we will try to treat him conservatively.  Followup will be with Dr. Daleen Squibb.  Dictated by:   Arturo Morton Riley Kill, M.D. [facility] Attending Physician:  Nelta Numbers DD:  06/21/01 TD:  06/22/01 Job: [id number]

## 2010-06-20 NOTE — H&P (Signed)
NAME:  [name], [name]                         ACCOUNT NO.:  [id number]   MEDICAL RECORD NO.:  [id number]                   PATIENT TYPE:  INP   LOCATION:  2308                                 FACILITY:  [facility]   PHYSICIAN:  Pricilla Riffle, M.D. [facility]             DATE OF BIRTH:  [birth year]   DATE OF ADMISSION:  10/14/2001  DATE OF DISCHARGE:                                HISTORY & PHYSICAL   IDENTIFYING INFORMATION/JUSTIFICATION FOR ADMISSION AND CARE:  The patient  is an 77 year old gentleman with known coronary artery disease (status post  CABG in 1996, status post PTCA stent SVG to diagonal in May 2003) who  presents with chest pain.   HISTORY OF PRESENT ILLNESS:  The patient awoke around 6 a.m., was in bed  when he noted the onset of severe chest pain.  The pain was in the  midsternum radiating slightly between shoulder blades accompanied by  diaphoresis, no shortness of breath or nausea.  The patient took three  nitroglycerin without relief, called EMS who gave nitroglycerin x two, pain  relieved at emergency room with IV nitroglycerin.  No other aggravating or  relieving factors.  Similar but not quite as severe as in May.   ALLERGIES:  None.   MEDICATIONS:  Lipitor 10 mg a day, Flomax 0.4 mg, aspirin 325, Coreg 3.125  q.12, Mavik, and Plavix 75.   PAST MEDICAL HISTORY:  1. CAD.  2. Hyperlipidemia.  3. Hypertension.  4. BPH.  5. Aneurysm, 6.5 cm infrarenal with neck.  The patient evaluated by Dr.     Madilyn Fireman, being considered for an investigational stent.   SOCIAL HISTORY:  The patient lives in [place], does not smoke, drinks  rarely.   REVIEW OF SYSTEMS:  Overall has reviewed negative to above problem except as  noted.   FAMILY HISTORY:  Not contributory.   PHYSICAL EXAMINATION:  The patient is currently pain free.  Blood pressure  130 to 160/60 to 80, pulse is in the 50s, temperature 97.  NECK:  JVD is normal.  No bruits.  LUNGS:  Clear.  CARDIAC EXAM:  Regular  rate and rhythm, normal S1 and S2, no  S3 or S4.  Grade 1 to 2/6 systolic murmur at right sternal border.  ABDOMINAL EXAM:  Benign, no hepatomegaly.  EXTREMITIES:  2+ pulses, no edema.   LABORATORY DATA:  Hemoglobin 13, platelets 129,000.  BUN and creatinine of  16 and 1.1.  CK is 48, MVF 1.3, troponin less than 0.1.   IMPRESSION:  The patient is an 77 year old gentleman with a history of chest  pain concerning for unstable angina.  Currently pain free.  Initial CK  negative.  I would go ahead and plan for cardiac catheterization.  Will  inform interventional service regarding aneurysm.   PLAN:  The patient has received a dose of Lovenox.  Continue aspirin,  nitroglycerin IV, beta blocker.  Also consider  Integrelin.                                               Pricilla Riffle, M.D. [facility]    PVR/MEDQ  D:  10/14/2001  T:  10/16/2001  Job:  [id number]

## 2010-06-20 NOTE — Cardiovascular Report (Signed)
NAME:  [name], [name]                         ACCOUNT NO.:  [id number]   MEDICAL RECORD NO.:  [id number]                   PATIENT TYPE:  INP   LOCATION:  2308                                 FACILITY:  [facility]   PHYSICIAN:  Salvadore Farber, M.D. [facility]         DATE OF BIRTH:  04-06-1912   DATE OF PROCEDURE:  10/14/2001  DATE OF DISCHARGE:                              CARDIAC CATHETERIZATION   PROCEDURE:  Coronary angiography, saphenous vein graft angiography x2,  cutting balloon angioplasty of the saphenous vein graft to the first  diagonal branch.   INDICATIONS FOR PROCEDURE:  Unstable angina.   DIAGNOSTIC TECHNIQUE:  Under 2% lidocaine local anesthesia, a #6 French  sheath was placed in the right femoral artery using a modified Seldinger  technique.  A J-tipped wire was carefully advanced via the sheath through  his abdominal aortic aneurysm and into his ascending aorta.  Catheters were  then advanced over this wire and long wire exchanges were subsequently used.  Angiography was performed using JR4 and JL4 catheters for the native  coronaries, left bypass catheters for the saphenous vein grafts.  The LAD  was previously known to be atretic and was not visualized.  Following the  diagnostic procedure, the patient received intervention.   COMPLICATIONS:  None.   DIAGNOSTIC FINDINGS:  1. Left main:  Angiographically normal.  2. LAD:  The LAD is a relatively small vessel extending barely to the apex.     There is an 80% stenosis of the proximal vessel and a further 70%     stenosis just further downstream.  The diagonal branch is occluded at its     ostium.  The LIMA to the LAD was previously demonstrated to be atretic     and was not visualized.  The saphenous vein graft to the first diagonal     branch has 90% in-stent restenosis of the mid graft stent.  3. Circumflex:  The circumflex is a moderate-sized vessel which gives rise     to two obtuse marginal branches.  There is a  90% stenosis of the proximal     vessel.  The vessel is approximately 2 to 2.25 mm in diameter.  The first     obtuse marginal branch is occluded proximally.  The saphenous vein graft     to the first obtuse marginal branch has severe diffuse disease with TIMI-     2 flow.  4. RCA:  The right coronary is an ectatic vessel which is diffusely diseased     without focal stenosis in the proximal and mid portions.  There is a 50-     60% stenosis of the large PLV and a hazy 60% stenosis of the posterior     descending artery.   IMPRESSION:  The patient has severe native vessel disease as well as  recurrent stenosis in both vein grafts and an atretic LIMA to his LAD.  Careful  comparison was made with his angiograms of May 13 and Jun 16, 2001.  The saphenous vein graft to the obtuse marginal has worsened flow compared  to the prior studies.  It was deemed unrevascularizable by Drs. Juanda Chance and  Stuckey at that time and appears more so now.  The severe proximal LAD  stenosis appears stable compared to the prior films.  The severe circumflex  stenosis also appears stable.  The RCA stenoses also appear stable including  the hazy lesion of the PDA which had a similar appearance in May.   Therefore, it appears that the most likely culprit for his acute syndrome is  the in-stent stenosis of the saphenous vein graft to the first diagonal  branch.  I will therefore proceed to percutaneous intervention of this  lesion.   PERCUTANEOUS CORONARY INTERVENTION TECHNIQUE:  Bivalirudin anticoagulation  was initiated.  The ACT was confirmed to be greater than 225.  An AL2 guide  was advanced over wire and engaged in the ostium of the vein graft.  A Hi-  Torque Floppy wire was advanced across the lesion into the diagonal branch.  No distal protection was employed since this was in-stent restenosis and  therefore unlikely to embolize.  A 3.25 x 10-mm cutting balloon was then  advanced across the lesion and  inflated to 10 atmospheres for a total of  four inflations.  Angiograms after the removal of the wire demonstrated no  residual stenosis and TIMI-3 flow.  The patient tolerated the procedure well  and was transferred to the holding room in stable condition.   IMPRESSION/PLAN:  The most likely culprit for his acute coronary syndrome  was the saphenous vein graft to the diagonal branch.  That is now widely  patent with TIMI-3 flow.  He does have multiple other severe lesions.  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  His circumflex, LAD, and PDA are all potentially  revascularizable.  Therefore would obtain adenosine Cardiolite during this  admission to guide further percutaneous revascularization if necessary.                                                Salvadore Farber, M.D. [facility]    WED/MEDQ  D:  10/14/2001  T:  10/16/2001  Job:  [id number]

## 2010-06-20 NOTE — Discharge Summary (Signed)
NAMEWENDAL, WILKIE NO.:  [id number]   MEDICAL RECORD NO.:  [id number]          PATIENT TYPE:  INP   LOCATION:  2020                         FACILITY:  [facility]   PHYSICIAN:  Di Kindle. Edilia Bo, M.D.DATE OF BIRTH:  09-16-1912   DATE OF ADMISSION:  12/27/2003  DATE OF DISCHARGE:  01/04/2004                                 DISCHARGE SUMMARY   ADMITTING DIAGNOSES:  Suspected ruptured abdominal aortic aneurysm.   DISCHARGE/SECONDARY DIAGNOSES:  1.  Symptomatic abdominal aortic aneurysm status post repair.  2.  Postoperative volume excess with scrotal edema responding to diuresis.  3.  History of coronary artery disease status post coronary artery bypass      graft and multiple percutaneous transluminal coronary angioplasties and      stents.  4.  Hyperlipidemia.  5.  Hypertension.  6.  Benign prostatic hyperplasia.   PROCEDURES:  December 27, 2003:  Repair symptomatic abdominal aortic  aneurysm with 22 mm tube graft by Dr. Waverly Ferrari.   ALLERGIES:  He is allergic to MORPHINE and SULFATE.   HISTORY:  Mr. Labat is an 77 year old male with known 8 cm abdominal aortic  aneurysm.  He had been followed by Dr. Madilyn Fireman.  However, it was felt by the  cardiologist that he was too high risk for surgery and therefore had been  turned down for repair of his aneurysm.  Three days prior to admission he  had been in [facility] and had developed some back pain which was quite  persistent.  He was sent back to [facility] urgently given his known history  of abdominal aortic aneurysm.  He then presented to [facility]  Emergency Department where a CT scan of the abdomen was obtained and there  suspicion of some inflammation or blood in the retroperitoneum consistent  with possible leaking or ruptured abdominal aortic aneurysm.  Of note, the  aneurysm had also enlarged to 9.5 cm.  Giving the enlarging aneurysm with  symptoms and a suspicious CT scan it was  felt that urgent exploration was  indicated.  After discussing risks/benefits with patient and the family  patient was in agreement and he was taken emergently to the operating room.   HOSPITAL COURSE:  December 27, 2003 Mr. Wager was admitted for possible  ruptured abdominal aortic aneurysm.  He was taken emergently to the  operating room and underwent repair.  Intraoperatively there was no evidence  of rupture.  Therefore, Dr. Edilia Bo felt the aneurysm likely expanded and he  became symptomatic and findings of the retroperitoneum on CT were related to  inflammation.  Overall, he is felt to tolerate the procedure as well as  expected and was transferred in stable condition to the surgical intensive  care unit.  Later that evening he remained sustained on the vent, but  hemodynamically stable.  Feet were well perfused and laboratories were  stable other than a mildly decreased platelet count 110.   On postoperative day one Mr. Harland had been extubated and was  neurologically intact.  He remained hemodynamically stable.  Due to his  cardiac history cardiology  consult was [facility] was requested.  From a GI standpoint he did report constipation and was treated with  Dulcolax.  At that point his bowel sounds were still absent on examination.   Mr. Belluomini remained in the surgical intensive care unit until December 31, 2003.  During that time he remained stable, but did initially require  dopamine drip which was eventually successfully weaned.  His GI status  slowly progressed and his nasogastric tube was eventually discontinued.  He  did have a good bowel movement after multiple Dulcolax suppositories.  His  laboratories remained stable.  His platelet count increased to normal.  On  examination, however, he did demonstrate some evidence of volume excess.  He  did have some lower extremity as well as scrotal edema.  His urine output  was also marginal initially.  Both these issues  were treated with diuretic  therapy with good response.   By postoperative day five Mr. Hankerson had been transferred out of surgical  intensive care unit out to the floor.  He continued to make good progress.  He did, however, complain of cough and congestion.  On examination his lungs  also noted to have some wheezing.  This was treated with Guaifenesin and  albuterol nebulizers.  A chest x-ray was also ordered to rule out any active  disease and his chest x-ray was negative for any acute lung disease only  showing stable cardiomegaly and thoracic aortic ectasias.  Once on the floor  he began making more progress with ambulation.  His diet was slowly advanced  to regular.  He continued to have bowel movements, although also requiring  Dulcolax suppositories.  His urine output began to improve.  His pain was  also controlled on oral medication.  Incisions were healing well without  sign of infections or dehiscence.  By postoperative day seven, January 03, 2004 Mr. Badeaux was felt nearly ready for discharge.  He remained afebrile,  was stable hemodynamics with his systolic blood pressure running between 120-  150 on the home antihypertensive regimen.  His heart rate was sinus rhythm  in the 60s-80s.  He was saturating 94% on room air.  On examination his  lungs still showed few scattered expiratory wheezes, but was overall felt to  be improving.  His white count was normal.  His abdominal examination  remained soft with slight distention, but with good bowel sounds.  He was  tolerating a regular diet.  His scrotal edema was also improving.  His feet  remained well perfused.  He was also making good progress with ambulation  and was felt to have a steady gait by the cardiac rehabilitation.  It was  felt that due to his steady progress that he would most likely be ready for  discharge home the following day.  His final discharge orders to be written during morning rounds pending no  significant change in Mr. Bazar status.   His anticipated date of discharge is January 04, 2004.  Prior to discharge  he was seen by cardiology who was in agreement with his discharge as well.   LABORATORY DATA:  Laboratories on January 03, 2004 showed a white blood  count of 8.2, hemoglobin 9.1, hematocrit 26.1, platelet count 221.  Sodium  136, potassium 3.5 which was supplemented, chloride 102, CO2 27, blood  glucose 98, BUN 13, creatinine 1.1, calcium 8.1.  Postoperative liver  enzymes showed his total bilirubin 0.9, alkaline phosphatase 37, SGOT 30,  SGPT 15.  His total protein was 5.4, albumin 2.3, and amylase of 29.   DISCHARGE MEDICATIONS:  1.  Mavik 2 mg one p.o. daily.  2.  Plavix 75 mg one p.o. daily.  3.  Chloride 6.25 mg one p.o. daily.  4.  Aspirin 81 mg one p.o. daily.  5.  Flomax 0.4 mg one p.o. daily.  6.  Caduet 5/10 mg one p.o. daily.  7.  Avodart 0.5 mg one p.o. daily.  8.  Trusopt 2% one drop in each eye b.i.d.  9.  Alphagan 0.15% one drop in each eye b.i.d.  10. Albuterol inhaler two puffs q.4h. p.r.n. shortness of breath or      wheezing.  11. Lasix 40 mg one p.o. daily x7 days.  12. K-Dur 20 mEq one p.o. daily x7 days.  13. Guaifenesin 600 mg one p.o. b.i.d. x7 days.  14. Tylox one to two tablets p.o. q.4h. p.r.n. pain.  15. Laxative of choice as needed for constipation.   ACTIVITY:  Instructed to avoid driving, heavy lifting more than 10 pounds or  strenuous activity.  He is encouraged to continue daily walking and  breathing exercises.   DIET:  He is to follow a low fat, low salt diet.   WOUND CARE:  May shower daily and clean his incisions with mild soap and  water.   SPECIAL INSTRUCTIONS:  He should notify the CVTS office if he develops fever  greater than 101 or redness or drainage from his incision site.  He should  also keep his scrotum elevated as needed for swelling.   FOLLOWUP:  He is to follow up with Dr. Edilia Bo at the CVTS office in  three  weeks.  He is to also have a nurse visit at the CVTS office in one week for  staple removal.  The CVTS office will contact him regarding specific  appointment date and time.  He should follow up with his cardiologist and  family practitioner within two to four weeks of follow-up.      Alli   AWZ/MEDQ  D:  01/03/2004  T:  01/03/2004  Job:  [id number]   cc:   Evelene Croon  [address]  [place]  [place] [address]  Fax: [phone]   Rollene Rotunda, M.D.

## 2010-06-20 NOTE — Assessment & Plan Note (Signed)
[facility] OFFICE NOTE   [name], [name]                      MRN:          [id number]  DATE:02/12/2006                            DOB:          [birth year]    [name] returns today for management of the following issues:  1. Coronary artery disease, currently stable on medical therapy.  He      is having no angina or significant angina equivalents.  He still      remains quite active though he is 8!  2. Peripheral vascular disease status post urgent abdominal aortic      aneurysm resection, November 2005.  His last ultrasound was in      November 2006 which showed no evidence of residual aneurysm.  3. Carotid artery disease being followed by Dr. Lacie Scotts.  He is      currently having no symptoms of TIAs or stroke.  4. Hyperlipidemia, followed by Dr. Lacie Scotts.  5. Hypertension with history of orthostatic hypertension.   CURRENT MEDICATIONS:  1. Omacor 1 gm b.i.d.  2. Mavik 2 mg a day.  3. Plavix 75 mg a day.  4. Coreg 12.5 b.i.d.  5. Aspirin 81 mg a day.  6. Avodart 0.5 mg q. p.m.  7. Flomax 0.4 mg q. p.m.  8. Caduet 5/20 q. p.m.  9. Iron daily.  10.Trusopt drops.  11.Alphagan drops.   He looks remarkably good.  He is in no acute distress.  SKIN:  Warm and dry.  His blood pressure is 130/68.  His pulse is 50 and he is in sinus brady.   EKG shows poor R-wave progression across the anterior precordium which  is stable.  He has some ST segment changes laterally, which are stable.   His weight is 185.  HEENT:  He has a ruddy complexion.  Eyes are swollen and sclerae are  injected as are his conjunctivae.  PERRLA.  Extraocular movements  intact.  Facial symmetry is normal.  Carotid upstrokes are equal bilaterally with soft systolic sound  bilaterally.  Thyroid is not enlarged, trachea is midline.  LUNGS:  Clear.  HEART:  Reveals normal S1, S2, with a soft systolic murmur along the  left sternal  border.  There is no gallop.  ABDOMINAL EXAM:  Soft with good bowel sounds and no midline bruit.  There is no hepatomegaly.  EXTREMITIES:  Reveal no edema.  Pulses are intact but reduced.  NEURO EXAM:  Intact.   ASSESSMENT AND PLAN:  [name] is doing remarkably well.  I have  reviewed his medications.  He asked me if he should spend $130 a month  on Omacor, and I told him I probably would not.  I think his other  medications are reasonable.  I have made no other changes.   He will followup with [place] in a year.     Thomas C. Daleen Squibb, MD, [facility]  Electronically Signed    TCW/MedQ  DD: 02/12/2006  DT: 02/12/2006  Job #: [id number]   cc:   Evelene Croon

## 2010-06-20 NOTE — Discharge Summary (Signed)
[name]. [facility]  Patient:    [name], [place] Visit Number: [id number] MRN: [id number]          Service Type: MED Location: [phone] Attending Physician:  Nelta Numbers Dictated by:   Dian Queen, P.A.C. [facility] Admit Date:  06/11/2001 Disc. Date: 06/19/01   CC:         Dr. Rutherford Nail, [place]   Referring Physician Discharge [place]  HISTORY:  [name] is an 77 year old white male with prior CABG in 1995, stent x 2 in 2001.  He has hypertension, hyperlipidemia, BPH, and is admitted now with progressive chest pain compatible with unstable angina.  ACCESSORY CLINICAL FINDINGS:  Electrolytes and renal function totally within normal limits.  Hemoglobin was 12.4, hematocrit 36.4, white count 7100.  BUN was 11 with a creatinine of 1.1.  CK-MB peaked at 535/129.1, and troponin peaked at 14.75.  Cholesterol was 124 with HDL of 32 and LDL of 82.  Renal aortic ultrasound revealed abdominal aortic aneurysm measuring 5.4 x 5.5 cm with no evidence of hemorrhage.  The right kidney showed a couple of large cysts but otherwise unremarkable.  Chest x-ray was unremarkable.  HOSPITAL COURSE:  The patient was admitted and ruled in.  He was cathd by Dr. 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  There was also a large abdominal aortic aneurysm noted.  With this finding, it was felt that intervention was indicated. Dr. Riley Kill then proceeded to successfully stent the vein graft to the diagonal.  There was some distal embolization secondary to inability to pass the export catheter, but the ultimate result looked quite good.  The groin was stable and no further chest  pain.  With respect to the aortic aneurysm, Dr. Jake Samples was consulted and will follow him carefully as an outpatient.  He stated that the neck of the aneurysm is 3.0 cm in diameter which is too large for currently available stents.  He is going to look into investigational stent and follow up with the patient in his office.  FINAL DIAGNOSES: 1. Coronary artery disease with prior CABG in 1995, stent x 2 in 2001, and    stent this admission, vein graft to the diagonal 1-2, complicated by distal    embolization secondary to inability to pass the export catheter.  The    ultimate opening 0% and smooth. 2. A 5.5 cm abdominal aortic aneurysm - seeing Dr. Madilyn Fireman regarding such.    Dr. Madilyn Fireman is going to look into getting an investigational stent as the    neck of his aneurysm is 3.0 which is too large for a currently available    stent. 3. Preserved left ventricular function. 4. Treated hyperlipidemia. 5. Controlled hypertension. 6. Benign prostatic hypertrophy.  DISPOSITION:  Patient discharged home.  DISCHARGE MEDICATIONS: 1. Aspirin 1 q.d. 2. Flomax q.h.s. 3. Altace 10 mg q.d. 4. Coreg 3.125 mg b.i.d. 5. Lipitor 10 mg q.d. 6. Plavix 75 mg q.d. 7. Nitroglycerin p.r.n.  FOLLOW-UP:  He will follow up with Dr. Daleen Squibb in several weeks as well as with Dr. Madilyn Fireman and Dr. Glenis Smoker long-term. Dictated by:   Dian Queen, P.A.C. [facility] Attending Physician:  Nelta Numbers DD:  06/19/01  TD:  06/19/01 Job: [id number]

## 2010-06-20 NOTE — Cardiovascular Report (Signed)
[place]  Patient:    [name], [name] Visit Number: [id number] MRN: [id number]          Service Type: MED Location: CCUB [id number] [id number] Attending Physician:  Nelta Numbers Dictated by:   [name], M.D. [facility] Proc. Date: 06/14/01 Admit Date:  06/11/2001   CC:         CV Laboratory  Maisie Fus C. Wall, M.D. [facility]  Leonard ___________, M.D., [place], [place]   Cardiac Catheterization  PROCEDURE:  Cardiac catheterization.  CARDIOLOGIST:  Daniel Morton. Riley Bullock, M.D.  INDICATIONS:  This nice gentleman presented with recurrent chest discomfort. He has had prior bypass surgery and multiple percutaneous coronary interventions.  The current study was done to assess coronary anatomy.  PROCEDURES: 1. Left heart catheterization. 2. Selective coronary angiography. 3. Selective left ventriculography. 4. Saphenous vein graft angiography x 2. 5. Internal mammary angiography.  DESCRIPTION OF PROCEDURE:  The procedure was performed from the right femoral artery using 6 French catheters.  The wire curled in the abdominal aorta, and a distal aortogram was then subsequently performed demonstrating a large abdominal aortic aneurysm.  We used the long exchange wire to exchange catheters.  It was difficult to get up into the internal mammary area but we were finally able to cannulate this area.  He tolerated the procedure without complication and was taken to the holding area in satisfactory clinical condition.  HEMODYNAMICS:  The central aortic pressure was 172/89, mean 124.  Left ventricular 141/7/17.  No significant gradient on pullback across the aortic valve.  ANGIOGRAPHIC DATA:  The left main coronary artery is free of critical disease.  The left anterior descending artery appears to course to the apex although I am not sure that this does not represent a septal perforator.  There are multiple 70% lesions in this vessel.  The internal mammary  to the distal left anterior descending appears to be atretic, and there is very slow filling in this area distally.  Also, the diagonal appears to be occluded.  The saphenous vein graft to the diagonal supplies the diagonal 1 and 2.  In the midportion of the graft is an 80% focal eccentric stenosis. Proximal to this is a previously placed stent, and this area demonstrates less than 30% narrowing.  The circumflex proper provides a marginal branch and an AV circumflex.  There is 90% narrowing after a tiny marginal branch leading into the second marginal branch which also has a 90% narrowing and has slow flow distally.  There is a saphenous vein graft that goes to the marginal.  This saphenous vein graft has multiple 70% areas of narrowing proximally and then a 70-80% mid area of haziness just beyond the valve.  Distally to this, there is a stent site with about 50% narrowing and then a 40% area of eccentric plaquing leading into the vessel which also has about 50% disease.  The right coronary artery demonstrates a substantial amount of ectasia.  The right coronary artery demonstrates 30-40% lesions both proximally and also distally prior to the acute margin.  The PDA appears to be widely patent with multiple areas of mild luminal irregularity.  The posterior lateral branch demonstrates patent stent site and then about 70% diffuse narrowing in the midportion of the posterior lateral branch which is similar to the previous study.  LEFT VENTRICULOGRAPHY:  Ventriculography in the RAO projection reveals vigorous global systolic function without segmental wall motion abnormalities.  ABDOMINAL AORTOGRAM:  The abdominal aortogram demonstrates a  sizable infrarenal abdominal aortic aneurysm that appears to be probable in excess of 4 cm.  The exact size is difficult to tell.  Abdominal ultrasound will be obtained.  CONCLUSIONS: 1. Preserved overall left ventricular function. 2. 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 3. Severe disease of the vein graft to the marginal and significant disease    of the vein graft to the sequential diagonal branches. 4. Continued patency of the right coronary artery with abnormalities    as described above. 5. Large abdominal aortic aneurysm.  DISPOSITION:  I plan to review the films with my colleagues.  The options are difficult.  I suspect the culprit lesion is the haziness inside the marginal graft.  However, this is a very difficult place for percutaneous intervention. The focal lesion of the diagonal is certainly treatable.  I probably would lean toward treating this and treating the other medically.  An abdominal ultrasound will be obtained to size this, and we may get a CVTS consult to evaluate for possible stent graft placement. Dictated by:   [name], M.D. [facility] Attending Physician:  Nelta Numbers DD:  06/14/01 TD:  06/16/01 Job: [id number]

## 2010-06-20 NOTE — Op Note (Signed)
[name], [name] NO.:  [id number]   MEDICAL RECORD NO.:  [id number]          PATIENT TYPE:  INP   LOCATION:  2550                         FACILITY:  [facility]   PHYSICIAN:  [name]. Edilia Bo, M.D.DATE OF BIRTH:  [birth year]   DATE OF PROCEDURE:  12/27/2003  DATE OF DISCHARGE:                                 OPERATIVE REPORT   PREOPERATIVE DIAGNOSIS:  Ruptured abdominal aortic aneurysm.   POSTOPERATIVE DIAGNOSIS:  Symptomatic abdominal aortic aneurysm.   OPERATION PERFORMED:  Repair of symptomatic abdominal aortic aneurysm with  20 mm tube graft.   SURGEON:  [name]. Edilia Bo, M.D.   ASSISTANT:  Eber Jones A. Eustaquio Boyden.   ANESTHESIA:  General.   INDICATIONS FOR PROCEDURE:  This is an 77 year old gentleman with a large  abdominal aortic aneurysm.  Apparently, he was felt to be too high risk for  surgery from a cardiac standpoint and therefore had not undergone elective  repair of this aneurysm.  He developed abdominal pain three days ago which  has persisted.  He came back to [facility] from [facility] and had a CT  scan in the emergency department today which showed evidence of some  inflammation or blood in the retroperitoneum possibly consistent with a  leak.  Given that he had a large 9.5 cm aneurysm and was symptomatic with a  suspicion of leak on CT, it was felt that without repair, this would likely  rupture and he would likely not survive this.  Therefore after extensive  discussion with the family, we elected to proceed with urgent repair of this  symptomatic abdominal aortic aneurysm.   DESCRIPTION OF PROCEDURE:  The patient was taken to the operating room after  a Swann-Ganz catheter and arterial line were placed by anesthesia.  The  patient received a general anesthetic.  The abdomen and groins were prepped  and draped in the usual sterile fashion.  The abdomen was entered through a  midline incision and upon exploration, he had  significant distention of his  colon and appeared to be full of stool.  He did have some diverticular  disease, no other intra-abdominal pathology was noted except for this large  9.5 cm aneurysm.  Of note, on his preoperative CAT scan, he was noted to  have a retroaortic left renal vein.  The retroperitoneal tissue was divided  up to the level of the renal arteries.  The retroaortic renal vein was below  the neck of the aneurysm.  Next, the dissection was carried down to the  bifurcation such that both common iliac arteries were dissected free and  clamps could be applied.  The patient then received 25 g of mannitol and  7000 units of intravenous heparin.  The common iliac arteries were clamped  and then the infrarenal neck clamped.  The aneurysm was opened and a large  amount of laminated thrombus removed.  Lumbars were oversewn with 2-0 silk  ties.  The aneurysm was teed off proximally above the retroaortic renal  vein.  It was elected to placed a tube graft.  The proximal anastomosis was  sewn  using a felt cuff.  This was a 20 mm tube graft.  After the proximal  anastomosis was completed, it was tested and hemostatic.  The aorta was then  reclamped after the graft was flushed.  The graft was then cut to the  appropriate length for anastomosis to the distal aorta right at the  bifurcation.  This was sewn with continuous 3-0 Prolene suture.  Prior to  completing the anastomosis, the artery was back-bled and flushed  appropriately.  Anastomosis was completed.  Flow was then re-established  which the patient tolerated from a hemodynamic standpoint.  There were good  femoral pulses at the completion.   Hemostasis was obtained in the wound and the heparin was reversed with  protamine.  The aneurysm was closed over the graft with running 2-0 Vicryl.  The retroperitoneal tissue was closed with running 2-0 Vicryl.  The  abdominal contents were returned to their normal position and the fascial   layer was then closed with two #1 PDS sutures.  Subcutaneous layer was  closed with 3-0 Vicryl and the skin was closed with staples.  A sterile  dressing was applied, the patient tolerated the procedure well and was  transferred to the recovery room in satisfactory condition.  All needle and  sponge counts were correct.      Chri   CSD/MEDQ  D:  12/27/2003  T:  12/27/2003  Job:  [id number]

## 2010-06-20 NOTE — Cardiovascular Report (Signed)
[place]  Patient:    [name]                       MRN: [id number] Proc. Date: 03/04/99 Adm. Date:  [id number] Attending:  Mirian Mo CC:         Jesse Sans. Wall, M.D. [facility]             Cardiac Cath Lab                        Cardiac Catheterization  PROCEDURE:  Selective coronary angiography, left ventricular angiography, SVG angiography x 2, LIMA angiography x 2, abdominal aortography - Judkins technique.  INDICATIONS:  The patient is an 77 year old gentleman who is post CABG by Alleen Borne, M.D. with LIMA to LAD, SVG to diagonal and diagonal #2, and SVG to OM1 nd OM2.  The initial films were done in [place], where he had the initial onset of chest pain.  The patient presents now with unstable angina, awakening him from sleep on more than two occasions.  RESULTS:  PRESSURES:  LV 150/20, AO 150/90.  ANGIOGRAPHY:  #1 - Left main is normal.  #2 - Left anterior descending showed 80% segmental lesion before the first diagonal which has a 99% focal lesion and decreased flow.  We did not have old films to compare, but I believe the LAD is somewhat small.  #3 - Circumflex reveals a 95% lesion proximally, TIMI-1 flow down the OM1.  #4 - Right coronary artery is a large dominant vessel with 50% lesion prior to  50%.  He had RCA 90%, and _____ 50% in the posterolateral branch.  BYPASS GRAFT ANGIOGRAPHY:  #1 - The LIMA to LAD appeared to be atrophic.  This was not selectively engaged  despite using LIMA catheter.  #2 - The SVG to diagonal #1 and diagonal #2 revealed 99% lesion with TIMI-1 flow.  #3 - The SVG to OM1 and OM2 was patent with a 90% lesion.  It is also irregular  proximally.  The LV was normal.  The abdominal aortogram revealed tortuous dilated, somewhat aneurysmal abdominal aorta.  SUMMARY:  The patient with severe three vessel disease as noted above.  He was having some continued mild chest  discomfort.  It is felt that the culprit lesion was probably the SVG to diagonal.  Dr. Juanda Chance planned to treat this percutaneously in a stage manner and treat the SVG and posterolateral branches percutaneously. DD:  03/04/99 TD:  03/05/99 Job: [id number]

## 2010-06-20 NOTE — H&P (Signed)
[name], [name] NO.:  [id number]   MEDICAL RECORD NO.:  [id number]          PATIENT TYPE:  INP   LOCATION:  2301                         FACILITY:  [facility]   PHYSICIAN:  Di Kindle. Edilia Bo, M.D.DATE OF BIRTH:  [birth year]   DATE OF ADMISSION:  12/27/2003  DATE OF DISCHARGE:                                HISTORY & PHYSICAL   REASON FOR ADMISSION:  Ruptured abdominal aortic aneurysm.   HISTORY:  This is an 77 year old gentleman with a known 8 cm abdominal  aortic aneurysm. He had been followed by Dr. Madilyn Fireman however, it was felt by  the cardiologist that he was too high risk for surgery and therefore he was  turned down for surgery.  He had been in [facility] three days ago and had  developed some back pain which was persistent.  He was sent back to  [facility] urgently given the known aneurysm.  He presented to the emergency  department here where a CT scan of the abdomen was obtained and there was a  suspicion of some inflammation or blood in retroperitoneum consistent with  possibly leaking or ruptured abdominal aortic aneurysm.  Of note, the  aneurysm had also enlarged to 9.5 cm.  Given the enlarging aneurysm with  symptoms and a suspicious CT scan, it was felt that urgent exploration was  indicated.  After an extensive discussion with the family, we elected to  proceed with urgent repair.   A quick history was obtained in the emergency department given the urgent  nature of the situation.   PAST MEDICAL HISTORY:  The patient, on past medical history admits to a  history of coronary artery disease.  He underwent coronary revascularization  by Dr. Laneta Simmers in 1996.  He subsequently has had PTCA and stents in 2002,  2003.  He denies any history of congestive heart failure.  He does have a  history of hypertension.  He denies any history of diabetes or  hypercholesterolemia.   PAST SURGICAL HISTORY:  He denies any history of previous abdominal  surgery.   ALLERGIES:  Morphine, sulfate.   MEDICATIONS:  1.  Mavik 2 mg p.o. daily.  2.  Plavix 75 mg p.o. daily.  3.  Coreg 6.25 mg p.o. daily.  4.  Aspirin 81 mg p.o. daily.  5.  Avodart 0.4 mg p.o. daily.  6.  Flomax 0.4 mg p.o. daily.  7.  Caduet 5/10 one p.o. daily.   REVIEW OF SYSTEMS:  Not obtained given the urgent nature of the situation  except that he did not admit to any recent chest pain or shortness of  breath.   PHYSICAL EXAMINATION:  VITAL SIGNS:  On physical examination, initial blood  pressure 120/70, heart rate is 80.  HEENT:  There was no cervical lymphadenopathy.  LUNGS:  Clear bilaterally to auscultation.  CARDIAC:  He had a regular rate and rhythm.  ABDOMEN:  Soft and nontender.  He had a palpable aneurysm.  He had palpable  femoral pulses and palpable pedal pulses.   CT scan was reviewed with the radiologist and there was some area  of  suspicion in the retroperitoneum behind this large aneurysm which was now  9.5 cm.  Of note, this aneurysm extended well up by the renals.  It appeared  to end at the bifurcation.   IMPRESSION:  The patient presents with a possible ruptured abdominal aortic  aneurysm and the plan is to take him urgently to the operating room for  elective repair.  He is certainly at high risk because of his age and  history of significant coronary artery disease.  In addition, he has a  retroaortic left renal vein.  The potential complications including but not  limited to death, renal failure, myocardial infarction or other unpredicted  medical problems were discussed with the patient preoperatively.  All their  questions were answered and he was agreeable to proceed.      Chri   CSD/MEDQ  D:  12/27/2003  T:  12/27/2003  Job:  [id number]

## 2010-07-24 ENCOUNTER — Encounter: Payer: Self-pay | Admitting: Cardiovascular Disease

## 2010-07-24 ENCOUNTER — Ambulatory Visit (INDEPENDENT_AMBULATORY_CARE_PROVIDER_SITE_OTHER): Payer: Medicare Other | Admitting: Cardiovascular Disease

## 2010-07-24 DIAGNOSIS — I351 Nonrheumatic aortic (valve) insufficiency: Secondary | ICD-10-CM | POA: Insufficient documentation

## 2010-07-24 DIAGNOSIS — I6529 Occlusion and stenosis of unspecified carotid artery: Secondary | ICD-10-CM

## 2010-07-24 DIAGNOSIS — I359 Nonrheumatic aortic valve disorder, unspecified: Secondary | ICD-10-CM

## 2010-07-24 DIAGNOSIS — I25810 Atherosclerosis of coronary artery bypass graft(s) without angina pectoris: Secondary | ICD-10-CM

## 2010-07-24 NOTE — Patient Instructions (Signed)
Your physician recommends that you schedule a follow-up appointment in: 6 months  

## 2010-07-24 NOTE — Assessment & Plan Note (Signed)
The patient has a hx of carotid artery disease and had a stenosis by duplex scan recently .  That report is not available at this time.  He is completely asymptomatic and is on ASA and Plavix.  At this point, I would follow him clinically and not anticipate carotid surgery.

## 2010-07-24 NOTE — Assessment & Plan Note (Signed)
He has a hx of AI in the past.  A recent echo ( report is not available ) was done by Dr. Welton Flakes.  I suspect he has AI as a cause of his murmur and will not need any further treatments.  I have asked him to decrease his salt intake.  His pulses are normal and he has no signs or symptoms of CHF.

## 2010-07-24 NOTE — Progress Notes (Signed)
[name] Date of Birth  1912-09-10 [facility] [address]     [address] [place], [place]  [address] [phone]  Fax  [phone]  History of Present Illness:  77 yo with progressive dyspnea for the past several weeks.  Using Advair inhaler more recently.  No angina.  Was able to put in a garden this year. Still mows his yard ( riding mower).  Does his own shopping, lives independantly.    Current Outpatient Prescriptions  Medication Sig Dispense Refill  . aspirin 81 MG EC tablet Take 81 mg by mouth daily.        . Cholecalciferol (VITAMIN D3) 400 UNITS tablet Take 400 Units by mouth daily.        . clopidogrel (PLAVIX) 75 MG tablet Take 75 mg by mouth daily.        Marland Kitchen doxazosin (CARDURA) 4 MG tablet Take 4 mg by mouth at bedtime.        . ferrous sulfate 325 (65 FE) MG tablet Take 325 mg by mouth daily.        . finasteride (PROSCAR) 5 MG tablet Take 5 mg by mouth daily.        Marland Kitchen lisinopril (PRINIVIL,ZESTRIL) 5 MG tablet Take 5 mg by mouth daily.        . metoprolol tartrate (LOPRESSOR) 25 MG tablet Take 25 mg by mouth 2 (two) times daily.       . Multiple Vitamin (MULTIVITAMIN) tablet Take 1 tablet by mouth daily.        . Omega-3 Fatty Acids (FISH OIL) 1000 MG CAPS Take 1,000 mg by mouth. When remember.       . pravastatin (PRAVACHOL) 40 MG tablet Take 40 mg by mouth at bedtime.        Marland Kitchen DISCONTD: doxazosin (CARDURA) 4 MG tablet Take 4 mg by mouth daily.        Marland Kitchen DISCONTD: hydrochlorothiazide (HYDRODIURIL) 12.5 MG tablet Take 12.5 mg by mouth daily.            No Known Allergies  Past Medical History  Diagnosis Date  . AAA (abdominal aortic aneurysm) 2005    Open AAA repair  . S/P CABG (coronary artery bypass graft) 2003    At [facility]; PCI 2004; no LHC since 2004  . Coronary artery disease     Right basal ganglia CVA 11/10; CTA neck showed 90% RICA stenosis, 75-80% LICA stenosis  . Echocardiogram abnormal 11/10    Mild  LVH, EF 55%, mild AI, diastolic dysfunction  . Hypertension     Unspecified  . Hyperlipidemia     Mixed  . History of orthostatic hypotension     Past Surgical History  Procedure Date  . Coronary artery bypass graft 2003  . Abdominal aortic aneurysm repair 2005  Right carotid endarectomy~ 2010  History  Smoking status  . Never Smoker   Smokeless tobacco  . Never Used    History  Alcohol Use No    Family History  Problem Relation Age of Onset  . Diabetes Other   . Coronary artery disease Other     Reviw of Systems:  Reviewed in the HPI.  All other systems are negative.  Physical Exam: BP 117/66  Pulse 59  Ht 5\' 8"  (1.727 m)  Wt 189 lb 6.4 oz (85.911 kg)  BMI 28.80 kg/m2 The patient is alert and oriented x 3.  The mood and affect are normal.  Skin: warm and dry.  Color is normal.    HEENT:   the sclera are nonicteric.  The mucous membranes are moist.  The carotids are 2+ without bruits.  There is a right CEA scar  There is no thyromegaly.  There is no JVD.    Lungs: clear.  The chest wall is non tender.    Heart: regular rate with a normal S1 and S2.  There is a soft systolic murmur. The PMI is not displaced.     Abdomin: good bowel sounds.  There is no guarding or rebound.  There is no hepatosplenomegaly or tenderness.  There are no masses.   Extremities:  Trace ankle edema.  The legs are without rashes.  The distal pulses are intact.   Neuro:  Cranial nerves II - XII are intact.  Motor and sensory functions are intact.    The gait is normal.  ECG: Sinus brady with occasional PVCs . Inc. LBBB .  NS ST /T wave abn  Assessment / Plan:

## 2010-07-24 NOTE — Assessment & Plan Note (Signed)
He is completely asymptomatic.  At age 77, I do not think he needs any further assessment.

## 2011-01-19 ENCOUNTER — Encounter: Payer: Self-pay | Admitting: Cardiovascular Disease

## 2011-01-19 ENCOUNTER — Ambulatory Visit (INDEPENDENT_AMBULATORY_CARE_PROVIDER_SITE_OTHER): Payer: Medicare Other | Admitting: Cardiovascular Disease

## 2011-01-19 DIAGNOSIS — R0600 Dyspnea, unspecified: Secondary | ICD-10-CM

## 2011-01-19 DIAGNOSIS — I509 Heart failure, unspecified: Secondary | ICD-10-CM

## 2011-01-19 DIAGNOSIS — R0602 Shortness of breath: Secondary | ICD-10-CM

## 2011-01-19 DIAGNOSIS — R0989 Other specified symptoms and signs involving the circulatory and respiratory systems: Secondary | ICD-10-CM

## 2011-01-19 DIAGNOSIS — I25810 Atherosclerosis of coronary artery bypass graft(s) without angina pectoris: Secondary | ICD-10-CM

## 2011-01-19 DIAGNOSIS — I5022 Chronic systolic (congestive) heart failure: Secondary | ICD-10-CM | POA: Insufficient documentation

## 2011-01-19 MED ORDER — POTASSIUM CHLORIDE ER 10 MEQ PO TBCR: 10.0000 meq | EXTENDED_RELEASE_TABLET | Freq: Every day | ORAL | Status: DC

## 2011-01-19 MED ORDER — FUROSEMIDE 40 MG PO TABS: 40.0000 mg | ORAL_TABLET | Freq: Every day | ORAL | Status: DC

## 2011-01-19 NOTE — Progress Notes (Signed)
[name] Date of Birth  1912/12/30 [facility] [address]    [address] [place], [place]  [address] [phone]  Fax  [phone]  History of Present Illness:  [name] is a 77 year old gentleman with a history of CAD - CABG ( 1995) and coronary stents ( 2004), AAA repair,  peripheral vascular disease. He was seen in our office 6 months ago. He had carotid endarterectomy last year.  He's been having some problems with shortness of breath he recently. The shortness of breath is especially bad at night.  He also has noted some swelling in his ankles.  He had bronchitis about 6 weeks ago and has not recovered completely since that time.   He's been using an inhaler twice a day for the past several weeks.  He eats out at restaurants approximately one meal each day. He may be getting a little bit of extra salt. He does not add salt to his food.  He occasionally has some episodes of very mild chest tightness. It is not associated with any specific activities such as exercise, eating, drinking. He has not tried any nitroglycerin for that tightness yet.  Current Outpatient Prescriptions on File Prior to Visit  Medication Sig Dispense Refill  . aspirin 81 MG EC tablet Take 81 mg by mouth daily.        . Cholecalciferol (VITAMIN D3) 400 UNITS tablet Take 400 Units by mouth daily.        . clopidogrel (PLAVIX) 75 MG tablet Take 75 mg by mouth daily.        Marland Kitchen doxazosin (CARDURA) 4 MG tablet Take 4 mg by mouth at bedtime.        . ferrous sulfate 325 (65 FE) MG tablet Take 325 mg by mouth daily.        . finasteride (PROSCAR) 5 MG tablet Take 5 mg by mouth daily.        Marland Kitchen lisinopril (PRINIVIL,ZESTRIL) 5 MG tablet Take 5 mg by mouth daily.        . metoprolol tartrate (LOPRESSOR) 25 MG tablet Take 25 mg by mouth 2 (two) times daily.       . Multiple Vitamin (MULTIVITAMIN) tablet Take 1 tablet by mouth daily.        . Omega-3 Fatty Acids (FISH OIL) 1000 MG CAPS Take 1,000 mg by  mouth. When remember.       . pravastatin (PRAVACHOL) 40 MG tablet Take 40 mg by mouth at bedtime.          No Known Allergies  Past Medical History  Diagnosis Date  . AAA (abdominal aortic aneurysm) 2005    Open AAA repair  . S/P CABG (coronary artery bypass graft) 2003    At [facility]; PCI 2004; no LHC since 2004  . Coronary artery disease     Right basal ganglia CVA 11/10; CTA neck showed 90% RICA stenosis, 75-80% LICA stenosis  . Echocardiogram abnormal 11/10    Mild LVH, EF 55%, mild AI, diastolic dysfunction  . Hypertension     Unspecified  . Hyperlipidemia     Mixed  . History of orthostatic hypotension     Past Surgical History  Procedure Date  . Coronary artery bypass graft 2003  . Abdominal aortic aneurysm repair 2005    History  Smoking status  . Never Smoker   Smokeless tobacco  . Never Used    History  Alcohol Use No    Family History  Problem Relation Age of Onset  . Diabetes Other   . Coronary artery disease Other     Reviw of Systems:  Reviewed in the HPI.  All other systems are negative.  Physical Exam: BP 142/82  Pulse 68  Ht 5\' 6"  (1.676 m)  Wt 184 lb (83.462 kg)  BMI 29.70 kg/m2 The patient is alert and oriented x 3.  The mood and affect are normal.   Skin: warm and dry.  Color is normal.    HEENT:   Normocephalic/atraumatic. His carotids are normal. No JVD.  Lungs: Clear to auscultation.  Heart: Regular rate S1-S2. He has a soft 2/6 systolic murmur radiating to the upper right sternal border.     Abdomen: His abdominal exam reveals good bowel sounds.  Extremities:  His no clubbing cyanosis. There is 1+ pitting edema to  Neuro:  Neuro exam is nonfocal. He is hard of hearing.    ECG: Normal sinus rhythm. He has a left bundle branch block.  Assessment / Plan:

## 2011-01-19 NOTE — Assessment & Plan Note (Addendum)
[name] has been having some shortness of breath, orthopnea, and leg swelling. He has a history of coronary artery disease. His last echocardiogram revealed normal left ventricular systolic function but it is quite possible that he has worsening left ventricular function.  At his age of 29 I would not be inclined to pursue an invasive workup but we certainly can get an echocardiogram for further evaluation of his left ventricular systolic function and valvular function. His blood pressure is a little elevated. We'll start him on furosemide 40 mg a day and potassium chloride 10 mEq a day. I'll see him back in the office in a month or 2 for followup visit and basic metabolic profile.  I've also asked him to try taking a nitroglycerin if he develops episodes of chest tightness.  His lungs are fairly clear. I think that he may have some degree of an upper respiratory tract infection. He received an inhaler from medical doctor up in [place]. He did not know the name of the name off his inhaler. His son will call back with that medication.

## 2011-01-19 NOTE — Patient Instructions (Signed)
Your physician has recommended you make the following change in your medication: Start Furosemide (Lasix) 40mg  daily. Start Potassium daily.   Your physician has requested that you have an echocardiogram. Echocardiography is a painless test that uses sound waves to create images of your heart. It provides your doctor with information about the size and shape of your heart and how well your heart's chambers and valves are working. This procedure takes approximately one hour. There are no restrictions for this procedure.  Your physician recommends that you schedule a follow-up appointment in: 2 months Your physician recommends that you return for lab work in: 2 months (BMP)

## 2011-02-17 ENCOUNTER — Other Ambulatory Visit (INDEPENDENT_AMBULATORY_CARE_PROVIDER_SITE_OTHER): Payer: Medicare Other | Admitting: *Deleted

## 2011-02-17 DIAGNOSIS — R0602 Shortness of breath: Secondary | ICD-10-CM

## 2011-02-17 DIAGNOSIS — I509 Heart failure, unspecified: Secondary | ICD-10-CM

## 2011-02-17 DIAGNOSIS — I359 Nonrheumatic aortic valve disorder, unspecified: Secondary | ICD-10-CM

## 2011-02-17 DIAGNOSIS — I059 Rheumatic mitral valve disease, unspecified: Secondary | ICD-10-CM

## 2011-02-23 ENCOUNTER — Telehealth: Payer: Self-pay | Admitting: *Deleted

## 2011-02-23 DIAGNOSIS — I509 Heart failure, unspecified: Secondary | ICD-10-CM

## 2011-02-23 NOTE — Telephone Encounter (Signed)
Upon calling pt back with echo results (showing CHF), he c/o dizziness r/t starting Lasix 40mg  daily. "Last week was the worst," BP at time of dizziness running low 100s, lowest has been 96, but normally in 100s. Swelling is "95% better," and breathing has much improved per pt, he is very pleased with results, just dizzy. He has lost 10lbs since starting. Do you want [place] to go ahead and check labs on pt, or decr Lasix prior to his f/u? Otherwise I told pt to cut dose in half if he has not heard back from me by tomorrow AM. He follows up with you 03/31/11 and has lab appt that AM. Please advise, thanks.

## 2011-02-23 NOTE — Telephone Encounter (Signed)
Spoke to pt, he will decr lasix to 20mg  daily and return for BMP in about 1 week.

## 2011-02-23 NOTE — Telephone Encounter (Signed)
Cutting back to 1/2 tab of Lasix is a great suggestion and I agree with that.  Ask him to continue to check BP.  We should having him get a BMP in a week.

## 2011-03-09 ENCOUNTER — Ambulatory Visit (INDEPENDENT_AMBULATORY_CARE_PROVIDER_SITE_OTHER): Payer: Medicare Other | Admitting: *Deleted

## 2011-03-09 DIAGNOSIS — R0602 Shortness of breath: Secondary | ICD-10-CM

## 2011-03-09 DIAGNOSIS — I509 Heart failure, unspecified: Secondary | ICD-10-CM

## 2011-03-10 LAB — BASIC METABOLIC PANEL
BUN/Creatinine Ratio: 18 (ref 10–22)
BUN: 20 mg/dL (ref 10–36)
CO2: 23 mmol/L (ref 20–32)
Calcium: 8.8 mg/dL (ref 8.6–10.2)
Chloride: 102 mmol/L (ref 97–108)
Creatinine, Ser: 1.09 mg/dL (ref 0.76–1.27)
GFR calc Af Amer: 66 mL/min/{1.73_m2} (ref >59)
GFR calc non Af Amer: 57 mL/min/{1.73_m2} — ABNORMAL LOW (ref >59)
Glucose: 76 mg/dL (ref 65–99)
Potassium: 4.4 mmol/L (ref 3.5–5.2)
Sodium: 140 mmol/L (ref 134–144)

## 2011-03-31 ENCOUNTER — Encounter: Payer: Self-pay | Admitting: Cardiovascular Disease

## 2011-03-31 ENCOUNTER — Other Ambulatory Visit: Payer: Medicare Other

## 2011-03-31 ENCOUNTER — Ambulatory Visit (INDEPENDENT_AMBULATORY_CARE_PROVIDER_SITE_OTHER): Payer: Medicare Other | Admitting: Cardiovascular Disease

## 2011-03-31 VITALS — BP 130/72 | HR 56 | Ht 66.0 in | Wt 177.0 lb

## 2011-03-31 DIAGNOSIS — I5022 Chronic systolic (congestive) heart failure: Secondary | ICD-10-CM

## 2011-03-31 NOTE — Assessment & Plan Note (Signed)
[name] is doing very well. His echocardiogram in December reveals moderate left ventricular dysfunction with an ejection fraction of around 35-40%. He has moderate aortic insufficiency, moderate mitral regurgitation, and mild to moderate tricuspid regurgitation.  He's doing quite a bit better on the low dose Lasix. We'll continue with Lasix. We'll check labs today including a basic metabolic profile, hepatic profile, and lipid panel. We will forward these to his primary medical doctor.  See him again in 6 months for followup visit. We'll check fasting labs at that time.

## 2011-03-31 NOTE — Patient Instructions (Signed)
Your physician recommends that you return for lab work in: 10/05/11 REPEAT FASTING BMET, LFT, LIPID   Your physician recommends that you schedule a follow-up appointment in: 6 MONTHS WITH DR. Elease Hashimoto IN THE [place] OFFICE  NO OTHER CHANGES

## 2011-03-31 NOTE — Progress Notes (Signed)
[name] Date of Birth  [birth year] [facility] [address]    [address] [place], [place]  [address] [phone]  Fax  [phone]  History of Present Illness:  [name] is a 78 year old gentleman with a history of CAD - CABG ( 1995) and coronary stents ( 2004), AAA repair,  peripheral vascular disease. He was seen in our office 6 months ago. He had carotid endarterectomy last year.  He's been having some problems with shortness of breath he recently. The shortness of breath is especially bad at night.  He also has noted some swelling in his ankles.  He had bronchitis about 6 weeks ago and has not recovered completely since that time.   He's been using an inhaler twice a day for the past several weeks.  He eats out at restaurants approximately one meal each day. He may be getting a little bit of extra salt. He does not add salt to his food.  He occasionally has some episodes of very mild chest tightness. It is not associated with any specific activities such as exercise, eating, drinking. He has not tried any nitroglycerin for that tightness yet.  His echocardiogram performed following his last visit revealed an ejection fraction between 35 and 40%. He has mild to moderate MR, mild to moderate TR, and mild to moderate aortic insufficiency. We started him on Lasix 20 mg a day and he feels much better.  Current Outpatient Prescriptions on File Prior to Visit  Medication Sig Dispense Refill  . aspirin 81 MG EC tablet Take 81 mg by mouth daily.        . Cholecalciferol (VITAMIN D3) 400 UNITS tablet Take 400 Units by mouth daily.        . clopidogrel (PLAVIX) 75 MG tablet Take 75 mg by mouth daily.        Marland Kitchen doxazosin (CARDURA) 4 MG tablet Take 2 mg by mouth at bedtime.       . ferrous sulfate 325 (65 FE) MG tablet Take 325 mg by mouth daily as needed.       . finasteride (PROSCAR) 5 MG tablet Take 5 mg by mouth daily.        . furosemide (LASIX) 40 MG tablet Take 0.5  tablets (20 mg total) by mouth daily.  30 tablet  11  . lisinopril (PRINIVIL,ZESTRIL) 5 MG tablet Take 2.5 mg by mouth at bedtime.       . metoprolol tartrate (LOPRESSOR) 25 MG tablet Take 12.5 mg by mouth 2 (two) times daily.       . Multiple Vitamin (MULTIVITAMIN) tablet Take 1 tablet by mouth daily.        . Omega-3 Fatty Acids (FISH OIL) 1000 MG CAPS Take 1,000 mg by mouth. When remember.       . potassium chloride (K-DUR) 10 MEQ tablet Take 1 tablet (10 mEq total) by mouth daily.  30 tablet  6  . pravastatin (PRAVACHOL) 40 MG tablet Take 40 mg by mouth at bedtime.          No Known Allergies  Past Medical History  Diagnosis Date  . AAA (abdominal aortic aneurysm) 2005    Open AAA repair  . S/P CABG (coronary artery bypass graft) 2003    At [facility]; PCI 2004; no LHC since 2004  . Coronary artery disease     Right basal ganglia CVA 11/10; CTA neck showed 90% RICA stenosis, 75-80% LICA stenosis  . Echocardiogram abnormal 11/10  Mild LVH, EF 55%, mild AI, diastolic dysfunction  . Hypertension     Unspecified  . Hyperlipidemia     Mixed  . History of orthostatic hypotension     Past Surgical History  Procedure Date  . Coronary artery bypass graft 2003  . Abdominal aortic aneurysm repair 2005    History  Smoking status  . Never Smoker   Smokeless tobacco  . Never Used    History  Alcohol Use No    Family History  Problem Relation Age of Onset  . Diabetes Other   . Coronary artery disease Other   . Heart disease Other     Reviw of Systems:  Reviewed in the HPI.  All other systems are negative.  Physical Exam: BP 130/72  Pulse 56  Ht 5\' 6"  (1.676 m)  Wt 177 lb (80.287 kg)  BMI 28.57 kg/m2 The patient is alert and oriented x 3.  The mood and affect are normal.   Skin: warm and dry.  Color is normal.    HEENT:   Normocephalic/atraumatic. His carotids are normal. No JVD.  Lungs: Clear to auscultation.  Heart: Regular rate S1-S2. He has a soft 2/6 systolic  murmur radiating to the upper right sternal border.     Abdomen: His abdominal exam reveals good bowel sounds.  Extremities:  His no clubbing cyanosis. He has no leg edema.   Neuro:  Neuro exam is nonfocal. He is hard of hearing.    ECG:  Assessment / Plan:

## 2011-04-01 LAB — BASIC METABOLIC PANEL
BUN/Creatinine Ratio: 18 (ref 10–22)
BUN: 20 mg/dL (ref 10–36)
CO2: 26 mmol/L (ref 20–32)
Calcium: 8.6 mg/dL (ref 8.6–10.2)
Chloride: 98 mmol/L (ref 97–108)
Creatinine, Ser: 1.09 mg/dL (ref 0.76–1.27)
GFR calc Af Amer: 66 mL/min/{1.73_m2} (ref >59)
GFR calc non Af Amer: 57 mL/min/{1.73_m2} — ABNORMAL LOW (ref >59)
Glucose: 87 mg/dL (ref 65–99)
Potassium: 4.5 mmol/L (ref 3.5–5.2)
Sodium: 136 mmol/L (ref 134–144)

## 2011-04-01 LAB — HEPATIC FUNCTION PANEL
ALT: 14 IU/L (ref 0–44)
AST: 21 IU/L (ref 0–40)
Albumin: 3.7 g/dL (ref 3.2–4.6)
Alkaline Phosphatase: 84 IU/L (ref 25–160)
Bilirubin, Direct: 0.14 mg/dL (ref 0.00–0.40)
Total Bilirubin: 0.4 mg/dL (ref 0.0–1.2)
Total Protein: 6.5 g/dL (ref 6.0–8.5)

## 2011-04-01 LAB — LIPID PANEL
Chol/HDL Ratio: 5 ratio units (ref 0.0–5.0)
Cholesterol, Total: 170 mg/dL (ref 100–199)
HDL: 34 mg/dL — ABNORMAL LOW (ref >39)
LDL Calculated: 107 mg/dL — ABNORMAL HIGH (ref 0–99)
Triglycerides: 146 mg/dL (ref 0–149)
VLDL Cholesterol Cal: 29 mg/dL (ref 5–40)

## 2011-09-28 ENCOUNTER — Other Ambulatory Visit (INDEPENDENT_AMBULATORY_CARE_PROVIDER_SITE_OTHER): Payer: Medicare Other

## 2011-09-28 DIAGNOSIS — I5022 Chronic systolic (congestive) heart failure: Secondary | ICD-10-CM

## 2011-09-29 LAB — HEPATIC FUNCTION PANEL
ALT: 10 IU/L (ref 0–44)
Alkaline Phosphatase: 72 IU/L (ref 44–105)
Bilirubin, Direct: 0.14 mg/dL (ref 0.00–0.40)
Total Bilirubin: 0.5 mg/dL (ref 0.0–1.2)
Total Protein: 6.9 g/dL (ref 6.0–8.5)

## 2011-09-29 LAB — BASIC METABOLIC PANEL
Chloride: 104 mmol/L (ref 97–108)
GFR calc Af Amer: 66 mL/min/{1.73_m2} (ref >59)
GFR calc non Af Amer: 57 mL/min/{1.73_m2} — ABNORMAL LOW (ref >59)
Potassium: 4.6 mmol/L (ref 3.5–5.2)
Sodium: 141 mmol/L (ref 134–144)

## 2011-09-29 LAB — LIPID PANEL
Chol/HDL Ratio: 4.9 ratio units (ref 0.0–5.0)
HDL: 37 mg/dL — ABNORMAL LOW (ref >39)
Triglycerides: 84 mg/dL (ref 0–149)

## 2011-09-30 ENCOUNTER — Encounter: Payer: Self-pay | Admitting: Cardiovascular Disease

## 2011-09-30 ENCOUNTER — Ambulatory Visit (INDEPENDENT_AMBULATORY_CARE_PROVIDER_SITE_OTHER): Payer: Medicare Other | Admitting: Cardiovascular Disease

## 2011-09-30 VITALS — BP 136/78 | HR 64 | Ht 65.0 in | Wt 182.2 lb

## 2011-09-30 DIAGNOSIS — I509 Heart failure, unspecified: Secondary | ICD-10-CM

## 2011-09-30 DIAGNOSIS — I5022 Chronic systolic (congestive) heart failure: Secondary | ICD-10-CM

## 2011-09-30 DIAGNOSIS — I25810 Atherosclerosis of coronary artery bypass graft(s) without angina pectoris: Secondary | ICD-10-CM

## 2011-09-30 DIAGNOSIS — R0989 Other specified symptoms and signs involving the circulatory and respiratory systems: Secondary | ICD-10-CM

## 2011-09-30 NOTE — Patient Instructions (Signed)
Your physician wants you to follow-up in: 6 months with Dr. Elease Hashimoto. You will receive a reminder letter in the mail two months in advance. If you don't receive a letter, please call our office to schedule the follow-up appointment.  Your physician recommends that you return for lab work in: 6 months, a few days prior to office visit. Make sure you are fasting.

## 2011-09-30 NOTE — Progress Notes (Signed)
[name] Date of Birth  September 08, 1912 [facility] [address]    [address] [place], [place]  [address] [phone]  Fax  [phone]   Problems: 1. Coronary artery disease-status post CABG 1995, status post cord extending into thousand 4 2. Abdominal aortic aneurysm repair 3. History of carotid endarterectomy 4. CHF- EF 35-40% 5. Mitral regurgitation 6.  Aortic insufficiency   History of Present Illness:  [name] is a 78 year old gentleman with a history of CAD - CABG ( 1995) and coronary stents ( 2004), AAA repair,  peripheral vascular disease. He was seen in our office 6 months ago. He had carotid endarterectomy last year.  He's been having some problems with shortness of breath he recently. The shortness of breath is especially bad at night.  He also has noted some swelling in his ankles.  He had bronchitis about 6 weeks ago and has not recovered completely since that time.   He's been using an inhaler twice a day for the past several weeks.  He eats out at restaurants approximately one meal each day. He may be getting a little bit of extra salt. He does not add salt to his food.  He occasionally has some episodes of very mild chest tightness. It is not associated with any specific activities such as exercise, eating, drinking. He has not tried any nitroglycerin for that tightness yet.  His echocardiogram performed following his last visit revealed an ejection fraction between 35 and 40%. He has mild to moderate MR, mild to moderate TR, and mild to moderate aortic insufficiency.  September 30, 2011 We started him on Lasix 20 mg a day last year and he feels much better.  He notes a bit of swelling particularly in his left foot.  Current Outpatient Prescriptions on File Prior to Visit  Medication Sig Dispense Refill  . aspirin 81 MG EC tablet Take 81 mg by mouth daily.        . Cholecalciferol (VITAMIN D3) 400 UNITS tablet Take 400 Units by mouth daily.          . clopidogrel (PLAVIX) 75 MG tablet Take 75 mg by mouth daily.        . dorzolamide-timolol (COSOPT) 22.3-6.8 MG/ML ophthalmic solution Place 1 drop into both eyes daily.      Marland Kitchen doxazosin (CARDURA) 4 MG tablet Take 2 mg by mouth at bedtime.       . ferrous sulfate 325 (65 FE) MG tablet Take 325 mg by mouth daily as needed.       . finasteride (PROSCAR) 5 MG tablet Take 5 mg by mouth daily.        . furosemide (LASIX) 40 MG tablet Take 0.5 tablets (20 mg total) by mouth daily.  30 tablet  11  . lisinopril (PRINIVIL,ZESTRIL) 5 MG tablet Take 2.5 mg by mouth at bedtime.       . metoprolol tartrate (LOPRESSOR) 25 MG tablet Take 12.5 mg by mouth 2 (two) times daily.       . Multiple Vitamin (MULTIVITAMIN) tablet Take 1 tablet by mouth daily.        . Omega-3 Fatty Acids (FISH OIL) 1000 MG CAPS Take 1,000 mg by mouth. When remember.       . potassium chloride (K-DUR) 10 MEQ tablet Take 1 tablet (10 mEq total) by mouth daily.  30 tablet  6  . pravastatin (PRAVACHOL) 40 MG tablet Take 40 mg by mouth at bedtime.  No Known Allergies  Past Medical History  Diagnosis Date  . AAA (abdominal aortic aneurysm) 2005    Open AAA repair  . S/P CABG (coronary artery bypass graft) 2003    At [facility]; PCI 2004; no LHC since 2004  . Coronary artery disease     Right basal ganglia CVA 11/10; CTA neck showed 90% RICA stenosis, 75-80% LICA stenosis  . Echocardiogram abnormal 11/10    Mild LVH, EF 55%, mild AI, diastolic dysfunction  . Hypertension     Unspecified  . Hyperlipidemia     Mixed  . History of orthostatic hypotension     Past Surgical History  Procedure Date  . Coronary artery bypass graft 2003  . Abdominal aortic aneurysm repair 2005    History  Smoking status  . Never Smoker   Smokeless tobacco  . Never Used    History  Alcohol Use No    Family History  Problem Relation Age of Onset  . Diabetes Other   . Coronary artery disease Other   . Heart disease Other     Reviw  of Systems:  Reviewed in the HPI.  All other systems are negative.  Physical Exam: BP 136/78  Pulse 64  Ht 5\' 5"  (1.651 m)  Wt 182 lb 4 oz (82.668 kg)  BMI 30.33 kg/m2 The patient is alert and oriented x 3.  The mood and affect are normal.   Skin: warm and dry.  Color is normal.    HEENT:   Normocephalic/atraumatic. His carotids are normal. No JVD.  Lungs: Clear to auscultation.  Heart: Regular rate S1-S2. He has a soft 2/6 systolic murmur radiating to the upper right sternal border.     Abdomen: His abdominal exam reveals good bowel sounds.  Extremities:  His no clubbing cyanosis. He has no leg edema.   Neuro:  Neuro exam is nonfocal. He is hard of hearing.    ECG: September 30, 2011 - sinus bradycardia at [employer] LBBB with associated ST abnormalities.  Assessment / Plan:

## 2011-09-30 NOTE — Assessment & Plan Note (Addendum)
[name] is doing very well. He's not having any sig significant episodes of chest pain or shortness breath. He has a history of congestive heart failure but it appears to be very well compensated at this time.  He's on medication.  He admits to eating a little bit of extra salt. He likes to be at least one time a day. He frequently eats at the [employer] he eats their fried fish.  I've asked him to watch his salt intake. We'll continue the same medications. He's very active at the chart and have asked him to continue staying active. I'll see him again in 6 months for office visit and for her.

## 2012-02-10 ENCOUNTER — Other Ambulatory Visit: Payer: Self-pay

## 2012-02-10 MED ORDER — FUROSEMIDE 40 MG PO TABS: 20.0000 mg | ORAL_TABLET | Freq: Every day | ORAL | Status: DC

## 2012-02-10 NOTE — Telephone Encounter (Signed)
Refill sent for furosemide 40 mg take 1/2 tablet daily per patient,

## 2012-03-10 ENCOUNTER — Ambulatory Visit (INDEPENDENT_AMBULATORY_CARE_PROVIDER_SITE_OTHER): Payer: Medicare Other | Admitting: Cardiovascular Disease

## 2012-03-10 ENCOUNTER — Encounter: Payer: Self-pay | Admitting: Cardiovascular Disease

## 2012-03-10 VITALS — BP 142/74 | HR 48 | Ht 65.0 in | Wt 180.8 lb

## 2012-03-10 DIAGNOSIS — I5022 Chronic systolic (congestive) heart failure: Secondary | ICD-10-CM

## 2012-03-10 DIAGNOSIS — I25810 Atherosclerosis of coronary artery bypass graft(s) without angina pectoris: Secondary | ICD-10-CM

## 2012-03-10 DIAGNOSIS — R0602 Shortness of breath: Secondary | ICD-10-CM

## 2012-03-10 DIAGNOSIS — I359 Nonrheumatic aortic valve disorder, unspecified: Secondary | ICD-10-CM

## 2012-03-10 DIAGNOSIS — R000 Tachycardia, unspecified: Secondary | ICD-10-CM

## 2012-03-10 DIAGNOSIS — I498 Other specified cardiac arrhythmias: Secondary | ICD-10-CM

## 2012-03-10 DIAGNOSIS — I509 Heart failure, unspecified: Secondary | ICD-10-CM

## 2012-03-10 DIAGNOSIS — I351 Nonrheumatic aortic (valve) insufficiency: Secondary | ICD-10-CM

## 2012-03-10 NOTE — Assessment & Plan Note (Signed)
[name] has a long history of bradycardia. His heart he does quite a bit slower today. We will discontinue the metoprolol to see if this helps.  We may also need to discontinue his timolol  eyedrops but I will have him check with his ophthalmologist first. I see him back in 3 months for followup office visit.

## 2012-03-10 NOTE — Progress Notes (Signed)
[name] Date of Birth  08/31/1912 [facility] [address]    [address] [place], [place]  [address] [phone]  Fax  [phone]   Problems: 1. Coronary artery disease-status post CABG 1995, status post coronary stenting in 2004. 2. Abdominal aortic aneurysm repair 3. History of carotid endarterectomy 4. CHF- EF 35-40% 5. Mitral regurgitation 6.  Aortic insufficiency 7. Sinus bradycardia   History of Present Illness:  [name] is a 79 year old gentleman with a history of CAD - CABG ( 1995) and coronary stents ( 2004), AAA repair,  peripheral vascular disease. He was seen in our office 6 months ago. He had carotid endarterectomy last year.  He's been having some problems with shortness of breath he recently. The shortness of breath is especially bad at night.  He also has noted some swelling in his ankles.  He had bronchitis about 6 weeks ago and has not recovered completely since that time.   He's been using an inhaler twice a day for the past several weeks.  He eats out at restaurants approximately one meal each day. He may be getting a little bit of extra salt. He does not add salt to his food.  He occasionally has some episodes of very mild chest tightness. It is not associated with any specific activities such as exercise, eating, drinking. He has not tried any nitroglycerin for that tightness yet.  His echocardiogram performed following his last visit revealed an ejection fraction between 35 and 40%. He has mild to moderate MR, mild to moderate TR, and mild to moderate aortic insufficiency.  September 30, 2011 We started him on Lasix 20 mg a day last year and he feels much better.  He notes a bit of swelling particularly in his left foot.  March 10, 2012: [name] presents today for evaluation of some palpitations and some lightheadedness. He's been having dizziness for the past several weeks. He still able to do all of his normal  activities.  Current Outpatient Prescriptions on File Prior to Visit  Medication Sig Dispense Refill  . aspirin 81 MG EC tablet Take 81 mg by mouth daily.        . clopidogrel (PLAVIX) 75 MG tablet Take 75 mg by mouth daily.        . dorzolamide-timolol (COSOPT) 22.3-6.8 MG/ML ophthalmic solution Place 1 drop into both eyes daily.      Marland Kitchen doxazosin (CARDURA) 4 MG tablet Take 2 mg by mouth at bedtime.       . ferrous sulfate 325 (65 FE) MG tablet Take 325 mg by mouth daily.       . finasteride (PROSCAR) 5 MG tablet Take 5 mg by mouth daily.        . furosemide (LASIX) 40 MG tablet Take 0.5 tablets (20 mg total) by mouth daily.  30 tablet  11  . lisinopril (PRINIVIL,ZESTRIL) 5 MG tablet Take 2.5 mg by mouth at bedtime.       . metoprolol tartrate (LOPRESSOR) 25 MG tablet Take 12.5 mg by mouth 2 (two) times daily.       . Multiple Vitamin (MULTIVITAMIN) tablet Take 1 tablet by mouth daily.        . Omega-3 Fatty Acids (FISH OIL) 1000 MG CAPS Take 1,000 mg by mouth. When remember.       . potassium chloride (K-DUR) 10 MEQ tablet Take 1 tablet (10 mEq total) by mouth daily.  30 tablet  6  . pravastatin (PRAVACHOL)  40 MG tablet Take 40 mg by mouth at bedtime.          No Known Allergies  Past Medical History  Diagnosis Date  . AAA (abdominal aortic aneurysm) 2005    Open AAA repair  . S/P CABG (coronary artery bypass graft) 2003    At [facility]; PCI 2004; no LHC since 2004  . Coronary artery disease     Right basal ganglia CVA 11/10; CTA neck showed 90% RICA stenosis, 75-80% LICA stenosis  . Echocardiogram abnormal 11/10    Mild LVH, EF 55%, mild AI, diastolic dysfunction  . Hypertension     Unspecified  . Hyperlipidemia     Mixed  . History of orthostatic hypotension     Past Surgical History  Procedure Date  . Coronary artery bypass graft 2003  . Abdominal aortic aneurysm repair 2005    History  Smoking status  . Never Smoker   Smokeless tobacco  . Never Used    History   Alcohol Use No    Family History  Problem Relation Age of Onset  . Diabetes Other   . Coronary artery disease Other   . Heart disease Other     Reviw of Systems:  Reviewed in the HPI.  All other systems are negative.  Physical Exam: BP 142/74  Pulse 48  Ht 5\' 5"  (1.651 m)  Wt 180 lb 12 oz (81.988 kg)  BMI 30.08 kg/m2 The patient is alert and oriented x 3.  The mood and affect are normal.   Skin: warm and dry.  Color is normal.    HEENT:   Normocephalic/atraumatic. His carotids are normal. He has soft carotid bruits versus radiation of his systolic murmur up into his carotids.   No JVD.  Lungs: Clear to auscultation.  Heart: Regular rate S1-S2. He has a soft 2/6 systolic murmur radiating to the upper right sternal border.   His heart rate is very slow.   Abdomen: His abdominal exam reveals good bowel sounds.  Extremities:  His no clubbing cyanosis. He has no leg edema.   Neuro:  Neuro exam is nonfocal. He is hard of hearing.    ECG: September 30, 2011 - sinus bradycardia at [employer] LBBB with associated ST abnormalities.  Assessment / Plan:

## 2012-03-10 NOTE — Patient Instructions (Addendum)
Your physician wants you to follow-up in: 3 months with Dr. Elease Hashimoto. You will receive a reminder letter in the mail two months in advance. If you don't receive a letter, please call our office to schedule the follow-up appointment.  Your physician has recommended you make the following change in your medication:  -stop metoprolol  Ask your eye doctor about an alternative medication to Trusopt eye drops.  This may cause your heart rate to slow down.  Labs today

## 2012-03-10 NOTE — Assessment & Plan Note (Signed)
He's not having any signs or symptoms of heart failure at this point. Continue with his same medications. We will need to discontinue the metoprolol because of his bradycardia.

## 2012-03-11 LAB — HEPATIC FUNCTION PANEL
AST: 19 IU/L (ref 0–40)
Albumin: 4 g/dL (ref 3.2–4.6)
Total Bilirubin: 0.5 mg/dL (ref 0.0–1.2)
Total Protein: 6.9 g/dL (ref 6.0–8.5)

## 2012-03-11 LAB — BASIC METABOLIC PANEL
BUN/Creatinine Ratio: 16 (ref 10–22)
BUN: 20 mg/dL (ref 10–36)
Calcium: 9 mg/dL (ref 8.6–10.2)
Creatinine, Ser: 1.24 mg/dL (ref 0.76–1.27)
GFR calc non Af Amer: 48 mL/min/{1.73_m2} — ABNORMAL LOW (ref >59)
Sodium: 137 mmol/L (ref 134–144)

## 2012-06-07 ENCOUNTER — Encounter: Payer: Self-pay | Admitting: Cardiovascular Disease

## 2012-06-07 ENCOUNTER — Ambulatory Visit (INDEPENDENT_AMBULATORY_CARE_PROVIDER_SITE_OTHER): Payer: Medicare Other | Admitting: Cardiovascular Disease

## 2012-06-07 VITALS — BP 112/80 | HR 72 | Ht 66.0 in | Wt 182.0 lb

## 2012-06-07 DIAGNOSIS — I10 Essential (primary) hypertension: Secondary | ICD-10-CM

## 2012-06-07 DIAGNOSIS — I25810 Atherosclerosis of coronary artery bypass graft(s) without angina pectoris: Secondary | ICD-10-CM

## 2012-06-07 DIAGNOSIS — R42 Dizziness and giddiness: Secondary | ICD-10-CM

## 2012-06-07 DIAGNOSIS — I5022 Chronic systolic (congestive) heart failure: Secondary | ICD-10-CM

## 2012-06-07 DIAGNOSIS — I498 Other specified cardiac arrhythmias: Secondary | ICD-10-CM

## 2012-06-07 DIAGNOSIS — I509 Heart failure, unspecified: Secondary | ICD-10-CM

## 2012-06-07 NOTE — Assessment & Plan Note (Signed)
His heart rate is better since he stopped his eye drops. He'll be seen as a doctor soon to see if she continue off the eyedrops were restarted. He did have a little bit of fatigue with a slow heart rate but we could conservatively consider restarting his eyedrops if it helps the pressure in his eyes.

## 2012-06-07 NOTE — Progress Notes (Signed)
[name] Date of Birth  08-27-1912 [facility] [address]    [address] [place], [place]  [address] [phone]  Fax  [phone]   Problems: 1. Coronary artery disease-status post CABG 1995, status post coronary stenting in 2004. 2. Abdominal aortic aneurysm repair 3. History of carotid endarterectomy 4. CHF- EF 35-40% 5. Mitral regurgitation 6.  Aortic insufficiency 7. Sinus bradycardia   History of Present Illness:  [name] is a 79 year old gentleman with a history of CAD - CABG ( 1995) and coronary stents ( 2004), AAA repair,  peripheral vascular disease. He was seen in our office 6 months ago. He had carotid endarterectomy last year.  He's been having some problems with shortness of breath he recently. The shortness of breath is especially bad at night.  He also has noted some swelling in his ankles.  He had bronchitis about 6 weeks ago and has not recovered completely since that time.   He's been using an inhaler twice a day for the past several weeks.  He eats out at restaurants approximately one meal each day. He may be getting a little bit of extra salt. He does not add salt to his food.  He occasionally has some episodes of very mild chest tightness. It is not associated with any specific activities such as exercise, eating, drinking. He has not tried any nitroglycerin for that tightness yet.  His echocardiogram performed following his last visit revealed an ejection fraction between 35 and 40%. He has mild to moderate MR, mild to moderate TR, and mild to moderate aortic insufficiency.  September 30, 2011 We started him on Lasix 20 mg a day last year and he feels much better.  He notes a bit of swelling particularly in his left foot.  March 10, 2012: [name] presents today for evaluation of some palpitations and some lightheadedness. He's been having dizziness for the past several weeks. He still able to do all of his normal activities.  Jun 07, 2012: [name] continues to do well.  He put in a garden this year ( tomatoes, peppers, cucs, onions) .   He has stopped his eye drops since we last saw him for a HR of 48.  His HR has now improved to 72.   Current Outpatient Prescriptions on File Prior to Visit  Medication Sig Dispense Refill  . Acetaminophen (TYLENOL PO) Take 500 mg by mouth as needed.      Marland Kitchen aspirin 81 MG EC tablet Take 81 mg by mouth daily.        . clopidogrel (PLAVIX) 75 MG tablet Take 75 mg by mouth daily.        . dorzolamide-timolol (COSOPT) 22.3-6.8 MG/ML ophthalmic solution Place 1 drop into both eyes daily.      Marland Kitchen doxazosin (CARDURA) 4 MG tablet Take 2 mg by mouth at bedtime.       . ferrous sulfate 325 (65 FE) MG tablet Take 325 mg by mouth daily.       . finasteride (PROSCAR) 5 MG tablet Take 5 mg by mouth daily.        . furosemide (LASIX) 40 MG tablet Take 0.5 tablets (20 mg total) by mouth daily.  30 tablet  11  . lisinopril (PRINIVIL,ZESTRIL) 5 MG tablet Take 2.5 mg by mouth at bedtime.       . Multiple Vitamin (MULTIVITAMIN) tablet Take 1 tablet by mouth daily.        . Omega-3  Fatty Acids (FISH OIL) 1000 MG CAPS Take 1,000 mg by mouth. When remember.       . potassium chloride (K-DUR) 10 MEQ tablet Take 1 tablet (10 mEq total) by mouth daily.  30 tablet  6  . pravastatin (PRAVACHOL) 40 MG tablet Take 40 mg by mouth at bedtime.         No current facility-administered medications on file prior to visit.    No Known Allergies  Past Medical History  Diagnosis Date  . AAA (abdominal aortic aneurysm) 2005    Open AAA repair  . S/P CABG (coronary artery bypass graft) 2003    At [facility]; PCI 2004; no LHC since 2004  . Coronary artery disease     Right basal ganglia CVA 11/10; CTA neck showed 90% RICA stenosis, 75-80% LICA stenosis  . Echocardiogram abnormal 11/10    Mild LVH, EF 55%, mild AI, diastolic dysfunction  . Hypertension     Unspecified  . Hyperlipidemia     Mixed  . History of orthostatic  hypotension     Past Surgical History  Procedure Laterality Date  . Coronary artery bypass graft  2003  . Abdominal aortic aneurysm repair  2005    History  Smoking status  . Never Smoker   Smokeless tobacco  . Never Used    History  Alcohol Use No    Family History  Problem Relation Age of Onset  . Diabetes Other   . Coronary artery disease Other   . Heart disease Other     Reviw of Systems:  Reviewed in the HPI.  All other systems are negative.  Physical Exam: BP 112/80  Pulse 72  Ht 5\' 6"  (1.676 m)  Wt 182 lb (82.555 kg)  BMI 29.39 kg/m2 The patient is alert and oriented x 3.  The mood and affect are normal.   Skin: warm and dry.  Color is normal.    HEENT:   Normocephalic/atraumatic. His carotids are normal.  He has a well healed CEA scar on his right neck.   Lungs: Clear to auscultation.  Heart: Regular rate S1-S2. He has a soft 2/6 systolic murmur radiating to the upper right sternal border.   His heart rate is very slow.   Abdomen: His abdominal exam reveals good bowel sounds.  Extremities:  His no clubbing cyanosis. He has no leg edema.   Neuro:  Neuro exam is nonfocal. He is hard of hearing.    ECG: Jun 07, 2012: Normal sinus rhythm at 72. He has occasional PACs. He has a left bundle branch block. His heart rate is faster since last tracing-otherwise no significant changes.  Assessment / Plan:

## 2012-06-07 NOTE — Assessment & Plan Note (Signed)
[name] is doing well. He remains very active. He has planted his garden already this year. He's not having any episodes of chest pain or shortness of breath. Continue with his same medications.

## 2012-06-07 NOTE — Assessment & Plan Note (Signed)
Stable

## 2012-06-07 NOTE — Patient Instructions (Addendum)
Your physician wants you to follow-up in: 6 months with Dr. Nahser. You will receive a reminder letter in the mail two months in advance. If you don't receive a letter, please call our office to schedule the follow-up appointment.  

## 2012-07-25 ENCOUNTER — Telehealth: Payer: Self-pay

## 2012-07-25 NOTE — Telephone Encounter (Signed)
Pt c/o new onset chest pressure and sob over the last 2 weeks. Has gotten worse over the last 2 days Worse with exertion, relieved with rest Also c/o insomnia and feeling "restless" at hs Has been staying with son in a town 3 hours away Bp="ok", HR=85 BPM Would like to see Dr. Elease Hashimoto this week  I had him hold while I called Dr. Elease Hashimoto in [facility] He does not have any openings this week Per Dr. Elease Hashimoto he is ok with having pt see another MD   Pt was informed He asked for an appt tomm instead  Scheduled with Dr. Mariah Milling tomm at 3:15  In meantime, I advised pt to take NTG PRN discomfort/symptoms He says this is at his home 3 hours away  I offered to call in RX for him at town he is in now He declines says he is going home today and will try SL NTG PRN Otherwise I advised him to go to ER should symptoms get worse/retirn without relief Understanding verb

## 2012-07-26 ENCOUNTER — Encounter: Payer: Self-pay | Admitting: Cardiovascular Disease

## 2012-07-26 ENCOUNTER — Ambulatory Visit (INDEPENDENT_AMBULATORY_CARE_PROVIDER_SITE_OTHER): Payer: Medicare Other | Admitting: Cardiovascular Disease

## 2012-07-26 VITALS — BP 104/82 | HR 84 | Ht 66.0 in | Wt 190.2 lb

## 2012-07-26 DIAGNOSIS — I10 Essential (primary) hypertension: Secondary | ICD-10-CM

## 2012-07-26 DIAGNOSIS — I509 Heart failure, unspecified: Secondary | ICD-10-CM

## 2012-07-26 DIAGNOSIS — R079 Chest pain, unspecified: Secondary | ICD-10-CM

## 2012-07-26 DIAGNOSIS — R0602 Shortness of breath: Secondary | ICD-10-CM

## 2012-07-26 DIAGNOSIS — I5022 Chronic systolic (congestive) heart failure: Secondary | ICD-10-CM

## 2012-07-26 DIAGNOSIS — I25810 Atherosclerosis of coronary artery bypass graft(s) without angina pectoris: Secondary | ICD-10-CM

## 2012-07-26 NOTE — Assessment & Plan Note (Signed)
Very mild chest discomfort in the setting of shortness of breath. If no improvement to his baseline in the near future, next several days, would need to consider ischemic workup.

## 2012-07-26 NOTE — Progress Notes (Signed)
Patient ID: [name], male    DOB: 01-Apr-1912, 79 y.o.   MRN: [id number]  HPI Comments: 79 year old gentleman, patient normally seen by Dr. Alphia Moh, who presents today with worsening shortness of breath, malaise, weakness, cough, weight gain, edema.  He went to visit a family member over the past week, did not sleep well for various reasons. He presents today for add-on given his new symptoms. He is been taking Lasix 20 mg daily. He does add some salt on his tomatoes and his aches but he only takes this on regular basis.  Denies any tachycardia or palpitations. Edema is unusual for him, particularly in his left ankle so early in the day. He believes that he has fluid overload. He has not tried additional diuretics. He's been trying to watch his fluid intake. No thick sputum, only a "hacky" cough. His weight is up from 182 up to 190 pounds compared to 6 weeks ago.  EKG today shows normal sinus rhythm with rate 84 beats per minute, left bundle branch block, left axis deviation, no change from prior EKGs   Problems: 1. Coronary artery disease-status post CABG 1995, status post coronary stenting in 2004. 2. Abdominal aortic aneurysm repair 3. History of carotid endarterectomy 4. CHF- EF 35-40% 5. Mitral regurgitation 6.  Aortic insufficiency 7. Sinus bradycardia     Outpatient Encounter Prescriptions as of 07/26/2012  Medication Sig Dispense Refill  . Acetaminophen (TYLENOL PO) Take 500 mg by mouth as needed.      Marland Kitchen aspirin 81 MG EC tablet Take 81 mg by mouth daily.        . clopidogrel (PLAVIX) 75 MG tablet Take 75 mg by mouth daily.        . dorzolamide-timolol (COSOPT) 22.3-6.8 MG/ML ophthalmic solution Place 1 drop into both eyes daily.      Marland Kitchen doxazosin (CARDURA) 4 MG tablet Take 2 mg by mouth at bedtime.       . ferrous sulfate 325 (65 FE) MG tablet Take 325 mg by mouth daily.       . finasteride (PROSCAR) 5 MG tablet Take 5 mg by mouth daily.        . furosemide (LASIX) 40 MG  tablet Take 0.5 tablets (20 mg total) by mouth daily.  30 tablet  11  . lisinopril (PRINIVIL,ZESTRIL) 5 MG tablet Take 2.5 mg by mouth at bedtime.       . Multiple Vitamin (MULTIVITAMIN) tablet Take 1 tablet by mouth daily.        . Omega-3 Fatty Acids (FISH OIL) 1000 MG CAPS Take 1,000 mg by mouth. When remember.       . potassium chloride (K-DUR) 10 MEQ tablet Take 1 tablet (10 mEq total) by mouth daily.  30 tablet  6  . pravastatin (PRAVACHOL) 40 MG tablet Take 40 mg by mouth at bedtime.          Review of Systems  Constitutional: Negative.   HENT: Negative.   Eyes: Negative.   Respiratory: Positive for chest tightness and shortness of breath.   Cardiovascular: Positive for leg swelling.  Gastrointestinal: Negative.   Musculoskeletal: Negative.   Skin: Negative.   Neurological: Negative.   Psychiatric/Behavioral: Negative.   All other systems reviewed and are negative.    BP 104/82  Pulse 84  Ht 5\' 6"  (1.676 m)  Wt 190 lb 4 oz (86.297 kg)  BMI 30.72 kg/m2   Physical Exam  Nursing note and vitals reviewed. Constitutional: He is oriented to  person, place, and time. He appears well-developed and well-nourished.  HENT:  Head: Normocephalic.  Nose: Nose normal.  Mouth/Throat: Oropharynx is clear and moist.  Eyes: Conjunctivae are normal. Pupils are equal, round, and reactive to light.  Neck: Normal range of motion. Neck supple. No JVD present.  Cardiovascular: Normal rate, regular rhythm, S1 normal, S2 normal and intact distal pulses.  Exam reveals no gallop and no friction rub.   Murmur heard.  Decrescendo systolic murmur is present  Trace edema of the bilateral lower extremities  Pulmonary/Chest: Effort normal and breath sounds normal. No respiratory distress. He has no wheezes. He has no rales. He exhibits no tenderness.  Abdominal: Soft. Bowel sounds are normal. He exhibits no distension. There is no tenderness.  Musculoskeletal: Normal range of motion. He exhibits no  edema and no tenderness.  Lymphadenopathy:    He has no cervical adenopathy.  Neurological: He is alert and oriented to person, place, and time. Coordination normal.  Skin: Skin is warm and dry. No rash noted. No erythema.  Psychiatric: He has a normal mood and affect. His behavior is normal. Judgment and thought content normal.      Assessment and Plan

## 2012-07-26 NOTE — Assessment & Plan Note (Addendum)
Clinical presentation suggests acute on chronic exacerbation of his systolic CHF. He is up 8 pounds from 5 weeks ago. We have suggested he take Lasix 40 mg twice a day for several days this week with potassium twice a day. He will call us in 2-3 days' time to let us know how his shortness of breath is improving. If no significant diuresis, could try torsemide. If cough gets thicker or green/yellow, would need antibiotic for possible bronchitis or walking pneumonia. At that time to do chest x-ray. Give any weight gain, I suspect this is more systolic CHF.  If he gets better in the next week, he will see Dr. Melburn Popper in routine followup.

## 2012-07-26 NOTE — Patient Instructions (Addendum)
  Please increase the lasix to 40 mg twice a day  (Am and 2 pm) until weight drops 5 pounds or until you feel better Then drop the lasix to 40 mg once a day Once your weight is back to normal, go back on a 1/2 lasix daily  Call the office on Friday to let us know about shortness of breath  Please call us if you have new issues that need to be addressed before your next appt.

## 2012-07-26 NOTE — Assessment & Plan Note (Signed)
Blood pressure is low today. He does report having dizzy episodes in the past. If he has recurrent dizzy episodes, could potentially make further medication changes.

## 2012-07-29 ENCOUNTER — Telehealth: Payer: Self-pay

## 2012-07-29 NOTE — Telephone Encounter (Signed)
Pt says he is feeling better since taking lasix BID He is down 4 pounds He will continue lasix as prescribed by Dr. Mariah Milling  He admits sob has improved and he is sleeping better and appetite has increased He will let us know should his symptoms return/change Understanding verb Will forward to Dr. Elease Hashimoto

## 2012-07-29 NOTE — Telephone Encounter (Signed)
Call to assess symptoms

## 2012-07-29 NOTE — Telephone Encounter (Signed)
fyi

## 2012-08-01 ENCOUNTER — Telehealth: Payer: Self-pay | Admitting: *Deleted

## 2012-08-01 NOTE — Telephone Encounter (Signed)
Pt says he is beginning to feel poorly again Says he is developing sob again, "but its not as bad as it was before", associated with fatigue and productive cough, now producing yellow sputum Is taking lasix 1 tablet daily Says he also feels "full" in chest when he walks to mailbox but this resolves when he rests He states, "somethings not right"  After reviewing Dr. Windell Hummingbird office note, and realizing Dr. Mariah Milling not in office today and Dr. Elease Hashimoto seeing pt's in [place], I advises pt to see PCP today ASAP to make sure he does not have pna, etc,. He states he had bronchitis earlier this year and was having same symptoms.  I had him hold while I called PCP to make appt for him Made appt with PCP this am at 1030 Pt informed and will go now

## 2012-08-01 NOTE — Telephone Encounter (Signed)
Dr. Mariah Milling aware He agrees with sending to PCP I will also make Dr. Elease Hashimoto aware

## 2012-08-01 NOTE — Telephone Encounter (Signed)
Not feeling well....lasix feels like its not work. Gaining weight.

## 2012-08-01 NOTE — Telephone Encounter (Signed)
I paged Dr. Mariah Milling to update him

## 2012-08-04 ENCOUNTER — Other Ambulatory Visit: Payer: Self-pay

## 2012-08-04 ENCOUNTER — Other Ambulatory Visit: Payer: Self-pay | Admitting: Cardiovascular Disease

## 2012-08-04 ENCOUNTER — Telehealth: Payer: Self-pay

## 2012-08-04 ENCOUNTER — Other Ambulatory Visit (INDEPENDENT_AMBULATORY_CARE_PROVIDER_SITE_OTHER): Payer: Medicare Other

## 2012-08-04 DIAGNOSIS — R079 Chest pain, unspecified: Secondary | ICD-10-CM

## 2012-08-04 DIAGNOSIS — I5022 Chronic systolic (congestive) heart failure: Secondary | ICD-10-CM

## 2012-08-04 DIAGNOSIS — R531 Weakness: Secondary | ICD-10-CM

## 2012-08-04 DIAGNOSIS — R0602 Shortness of breath: Secondary | ICD-10-CM

## 2012-08-04 DIAGNOSIS — I25810 Atherosclerosis of coronary artery bypass graft(s) without angina pectoris: Secondary | ICD-10-CM

## 2012-08-04 MED ORDER — TORSEMIDE 20 MG PO TABS: 40.0000 mg | ORAL_TABLET | Freq: Two times a day (BID) | ORAL | Status: DC

## 2012-08-04 MED ORDER — METOLAZONE 5 MG PO TABS: 5.0000 mg | ORAL_TABLET | Freq: Every day | ORAL | Status: DC

## 2012-08-04 MED ORDER — POTASSIUM CHLORIDE CRYS ER 20 MEQ PO TBCR: 20.0000 meq | EXTENDED_RELEASE_TABLET | Freq: Every day | ORAL | Status: DC

## 2012-08-04 NOTE — Telephone Encounter (Signed)
Pt states he went to PCP, dx with bronchitis, states he "just doesn't feel right", weakness, weight has went to 190, has question about medication. Please call

## 2012-08-04 NOTE — Telephone Encounter (Signed)
Pt reports weight back up to 190 pounds, still c/o chest pressure with exertion Feeling "weak", stating "somethings not right" Sob has improved Was prescribed Zpack by PCP for EA:[id number] still coughing up occasional yellow sputum C/o feeling "drained" despite 5 days of abx   I discussed with Dr. Mariah Milling who suggests echocardiogram and BNP Worked pt in for echo today at 1215 Pt verb understanding Will get labs same time

## 2012-08-04 NOTE — Telephone Encounter (Signed)
Error

## 2012-08-04 NOTE — Progress Notes (Signed)
For your shortness of breath Please hold the lasix  Start torsemide 40 mg twice a day at 8 am and 2 pm, Take metolazone 5 mg in the Am 30 minutes before Am torsemide if you do not have significant urination on torsemide alone.  Take potassium once a day

## 2012-08-08 ENCOUNTER — Other Ambulatory Visit: Payer: Self-pay

## 2012-08-08 ENCOUNTER — Telehealth: Payer: Self-pay

## 2012-08-08 DIAGNOSIS — R0602 Shortness of breath: Secondary | ICD-10-CM

## 2012-08-08 NOTE — Telephone Encounter (Signed)
pts son called back, wanted to make [place] aware of pt's BPs this am 84/45, 83/48 Pt remains asymptomatic I reassured him I would let Dr. Mariah Milling know when I discuss diuretics with him

## 2012-08-08 NOTE — Telephone Encounter (Signed)
Late entry: Pt came for echocardiogram as scheduled 08/04/12 Dr. Mariah Milling in to assess pt Dr. Mariah Milling started pt on torsemide 20 mg 2 tablets BID with metolazone 5 mg daily. He told pt we would call pt Monday 08/08/12 to assess symptoms. Pt verbalized understanding and was taken home via son

## 2012-08-08 NOTE — Telephone Encounter (Signed)
Pt reports he feels "much better" since starting torsemide and metolazone last week Weight is down from 190 pounds 7/3 to 178 pounds this am Denies worsening sob or weakness Denies dizziness BP=93/52 I will discuss with Dr. Mariah Milling to see how he wants him to proceed with diuretics, etc and call pt back Understanding verb

## 2012-08-08 NOTE — Telephone Encounter (Signed)
Pt informed of Dr. Windell Hummingbird instructions Understanding verb Coming in tomm for BMP at 1000 Will make him an appt with MD at that time

## 2012-08-08 NOTE — Telephone Encounter (Signed)
Line busy

## 2012-08-08 NOTE — Telephone Encounter (Signed)
I discussed with Dr. Mariah Milling who gave orders as follows: "BMP today or tomorrow. Stop metolazone. Decrease torsemide to 40 mg in am only. Pt may take torsemide 40 mg BID if weight gain of 3+ pounds overnight/5 pounds in 1 week.  Follow up with Dr. Mariah Milling or Dr. Elease Hashimoto." V.O. Dr. Alvis Lemmings, RN, BSN

## 2012-08-09 ENCOUNTER — Ambulatory Visit (INDEPENDENT_AMBULATORY_CARE_PROVIDER_SITE_OTHER): Payer: Medicare Other

## 2012-08-09 DIAGNOSIS — R0602 Shortness of breath: Secondary | ICD-10-CM

## 2012-08-10 LAB — BASIC METABOLIC PANEL
BUN/Creatinine Ratio: 25 — ABNORMAL HIGH (ref 10–22)
BUN: 59 mg/dL — ABNORMAL HIGH (ref 10–36)
Chloride: 87 mmol/L — ABNORMAL LOW (ref 97–108)
GFR calc Af Amer: 25 mL/min/{1.73_m2} — ABNORMAL LOW (ref >59)
Sodium: 132 mmol/L — ABNORMAL LOW (ref 134–144)

## 2012-08-15 ENCOUNTER — Telehealth: Payer: Self-pay | Admitting: *Deleted

## 2012-08-15 NOTE — Telephone Encounter (Signed)
Pt called because he said when the doctor stop the fluid pill pt had gain two lbs. The  Md  Change the medication  (Torasemide 40 mg) to once a day; he is still keeping the  2 lbs on . Pt states he is feeling much better now ,pt denies SOB. Pt was reminder that when MD restarted the Torsemide  40 mg once a day he recommended for pt to take this medication twice a DAY IF HE GAINS 3 + LBS OVERNIGHT OR 5 LBS IN A WEEK. Pt verbalized understanding.

## 2012-08-15 NOTE — Telephone Encounter (Signed)
Has some medication questions.. Patient not feeling well

## 2012-08-26 ENCOUNTER — Telehealth: Payer: Self-pay | Admitting: Cardiovascular Disease

## 2012-08-26 NOTE — Telephone Encounter (Signed)
Dr Allena Katz calling re this pt , would like a call from you on Monday

## 2012-08-30 ENCOUNTER — Ambulatory Visit (INDEPENDENT_AMBULATORY_CARE_PROVIDER_SITE_OTHER): Payer: Medicare Other | Admitting: Cardiovascular Disease

## 2012-08-30 ENCOUNTER — Encounter: Payer: Self-pay | Admitting: Cardiovascular Disease

## 2012-08-30 VITALS — BP 110/74 | HR 69 | Ht 66.0 in | Wt 178.5 lb

## 2012-08-30 DIAGNOSIS — R0602 Shortness of breath: Secondary | ICD-10-CM

## 2012-08-30 DIAGNOSIS — I5022 Chronic systolic (congestive) heart failure: Secondary | ICD-10-CM

## 2012-08-30 DIAGNOSIS — I509 Heart failure, unspecified: Secondary | ICD-10-CM

## 2012-08-30 NOTE — Assessment & Plan Note (Addendum)
Comment presents for followup of his recent admission for acute on chronic systolic congestive heart failure. His systolic heart failure is complicated by the fact that he has at least moderate aortic stenosis and moderate mitral regurgitation.  Also had cardiorenal syndrome and had some renal insufficiency. He also had atrial fibrillation.  He has maintained sinus rhythm on the amiodarone.  We will reduce the amiodarone to 200 mg a day.  He received IV heparin during the hospitalization but was not discharged on any anticoagulation. I will need to get the records from [facility] to ensure that he indeed had an episode of atrial fibrillation. He has a history of congestive heart failure and has indications for anticoagulation. He's fairly steady on his feet and does not have any imbalance issues or history of falling.  He also has a left bundle branch block and has an ejection fraction of around 20-25%. We discussed   a biventricular pacer/ICD but at his age of 48 I'm not sure that it would necessarily be  beneficial. He has aortic stenosis as well as mitral regurgitation.  We'll continue to follow him for now. I do not anticipate placing a biventricular pacemaker/ICD.

## 2012-08-30 NOTE — Progress Notes (Signed)
[name] Date of Birth  Mar 17, 1912 [facility] [address]    [address] [place], [place]  [address] [phone]  Fax  [phone]   Problems: 1. Coronary artery disease-status post CABG 1995, status post coronary stenting in 2004. 2. Abdominal aortic aneurysm repair 3. History of carotid endarterectomy 4. CHF- EF 35-40% 5. Mitral regurgitation 6.  Aortic insufficiency 7. Sinus bradycardia 8. LBBB 9. Atrial fibrillation   History of Present Illness:  Daniel. [name] is a 79 year old gentleman with a history of CAD - CABG ( 1995) and coronary stents ( 2004), AAA repair,  peripheral vascular disease. He was seen in our office 6 months ago. He had carotid endarterectomy last year.  He's been having some problems with shortness of breath he recently. The shortness of breath is especially bad at night.  He also has noted some swelling in his ankles.  He had bronchitis about 6 weeks ago and has not recovered completely since that time.   He's been using an inhaler twice a day for the past several weeks.  He eats out at restaurants approximately one meal each day. He may be getting a little bit of extra salt. He does not add salt to his food.  He occasionally has some episodes of very mild chest tightness. It is not associated with any specific activities such as exercise, eating, drinking. He has not tried any nitroglycerin for that tightness yet.  His echocardiogram performed following his last visit revealed an ejection fraction between 35 and 40%. He has mild to moderate Daniel, mild to moderate TR, and mild to moderate aortic insufficiency.  September 30, 2011 We started him on Lasix 77 mg a day last year and he feels much better.  He notes a bit of swelling particularly in his left foot.  March 10, 2012: Daniel. [name] presents today for evaluation of some palpitations and some lightheadedness. He's been having dizziness for the past several weeks. He still able to do all  of his normal activities.  Jun 07, 2012: [name] continues to do well.  He put in a garden this year ( tomatoes, peppers, cucs, onions) .   He has stopped his eye drops since we last saw him for a HR of 48.  His HR has now improved to 72.  August 30, 2012  [name] was hospitalized with CHF at [facility] last week.  He also had atrial fibrillation and was DCd on amiodarone.   The cardura and furosemide were stopped.  He was placed on Torsemide instead of Lasix.     He is feeling better at this point.     Current Outpatient Prescriptions on File Prior to Visit  Medication Sig Dispense Refill  . aspirin 81 MG EC tablet Take 81 mg by mouth daily.        . clopidogrel (PLAVIX) 75 MG tablet Take 75 mg by mouth daily.        . dorzolamide-timolol (COSOPT) 22.3-6.8 MG/ML ophthalmic solution Place 1 drop into both eyes daily.      . ferrous sulfate 325 (65 FE) MG tablet Take 325 mg by mouth daily.       . finasteride (PROSCAR) 5 MG tablet Take 5 mg by mouth daily.        Marland Kitchen lisinopril (PRINIVIL,ZESTRIL) 5 MG tablet Take 2.5 mg by mouth at bedtime.       . Multiple Vitamin (MULTIVITAMIN) tablet Take 1 tablet by mouth daily.        Marland Kitchen  potassium chloride SA (K-DUR,KLOR-CON) 20 MEQ tablet Take 1 tablet (20 mEq total) by mouth daily.  30 tablet  3  . pravastatin (PRAVACHOL) 40 MG tablet Take 40 mg by mouth at bedtime.        . torsemide (DEMADEX) 20 MG tablet Take 2 tablets (40 mg total) by mouth 2 (two) times daily.  120 tablet  3   No current facility-administered medications on file prior to visit.    No Known Allergies  Past Medical History  Diagnosis Date  . AAA (abdominal aortic aneurysm) 2005    Open AAA repair  . S/P CABG (coronary artery bypass graft) 2003    At [facility]; PCI 2004; no LHC since 2004  . Coronary artery disease     Right basal ganglia CVA 11/10; CTA neck showed 90% RICA stenosis, 75-80% LICA stenosis  . Echocardiogram abnormal 11/10    Mild LVH, EF 55%, mild AI, diastolic dysfunction   . Hypertension     Unspecified  . Hyperlipidemia     Mixed  . History of orthostatic hypotension     Past Surgical History  Procedure Laterality Date  . Coronary artery bypass graft  2003  . Abdominal aortic aneurysm repair  2005    History  Smoking status  . Never Smoker   Smokeless tobacco  . Never Used    History  Alcohol Use No    Family History  Problem Relation Age of Onset  . Diabetes Other   . Coronary artery disease Other   . Heart disease Other     Reviw of Systems:  Reviewed in the HPI.  All other systems are negative.  Physical Exam: BP 110/74  Pulse 69  Ht 5\' 6"  (1.676 m)  Wt 178 lb 8 oz (80.967 kg)  BMI 28.82 kg/m2 The patient is alert and oriented x 3.  The mood and affect are normal.   Skin: warm and dry.  Color is normal.    HEENT:   Normocephalic/atraumatic. His carotids are normal.  He has a well healed CEA scar on his right neck.   Lungs: Clear to auscultation.  Heart: Regular rate S1-S2. He has a soft 2/6 systolic murmur radiating to the upper right sternal border.     Abdomen: His abdominal exam reveals good bowel sounds.  Extremities:  His no clubbing cyanosis. He has no leg edema.   Neuro:  Neuro exam is nonfocal. He is hard of hearing.    ECG: 08/30/2012: Normal sinus rhythm at 67 beats a minute. He has left bundle branch block  Assessment / Plan:

## 2012-08-30 NOTE — Patient Instructions (Addendum)
Check the price of   Xarelto 20 mg a day Pradaxa 150 mg  Twice a day ( or we may reduce the dose to 75 mg twice a day given your age) Eliquis 5 mg twice a day  Your physician has recommended you make the following change in your medication:  1) Decrease amiodarone to 200 mg one tablet by mouth daily.  Your physician recommends that you schedule a follow-up appointment in: 3 months with Dr. Elease Hashimoto

## 2012-09-07 ENCOUNTER — Other Ambulatory Visit: Payer: Self-pay

## 2012-10-16 ENCOUNTER — Inpatient Hospital Stay: Payer: Self-pay | Admitting: Family Medicine

## 2012-10-16 LAB — URINALYSIS, COMPLETE
Bacteria: NONE SEEN
Hyaline Cast: 8
Leukocyte Esterase: NEGATIVE
Ph: 5 (ref 4.5–8.0)
RBC,UR: 3 /HPF (ref 0–5)
Squamous Epithelial: NONE SEEN

## 2012-10-16 LAB — COMPREHENSIVE METABOLIC PANEL
Alkaline Phosphatase: 87 U/L (ref 50–136)
Anion Gap: 7 (ref 7–16)
Bilirubin,Total: 0.8 mg/dL (ref 0.2–1.0)
Calcium, Total: 8.9 mg/dL (ref 8.5–10.1)
EGFR (African American): 28 — ABNORMAL LOW
EGFR (Non-African Amer.): 24 — ABNORMAL LOW
Osmolality: 278 (ref 275–301)
SGOT(AST): 32 U/L (ref 15–37)
SGPT (ALT): 18 U/L (ref 12–78)
Sodium: 132 mmol/L — ABNORMAL LOW (ref 136–145)
Total Protein: 8.1 g/dL (ref 6.4–8.2)

## 2012-10-16 LAB — CBC
MCHC: 32.9 g/dL (ref 32.0–36.0)
Platelet: 131 10*3/uL — ABNORMAL LOW (ref 150–440)
WBC: 14.3 10*3/uL — ABNORMAL HIGH (ref 3.8–10.6)

## 2012-10-16 LAB — TROPONIN I: Troponin-I: <0.02 ng/mL

## 2012-10-16 LAB — CK TOTAL AND CKMB (NOT AT ARMC): CK, Total: 113 U/L (ref 35–232)

## 2012-10-17 LAB — CBC WITH DIFFERENTIAL/PLATELET
Eosinophil #: 0 10*3/uL (ref 0.0–0.7)
Eosinophil %: 0.1 %
HCT: 33.3 % — ABNORMAL LOW (ref 40.0–52.0)
Lymphocyte #: 0.8 10*3/uL — ABNORMAL LOW (ref 1.0–3.6)
Lymphocyte %: 6.1 %
MCH: 28.6 pg (ref 26.0–34.0)
MCHC: 32.8 g/dL (ref 32.0–36.0)
Neutrophil %: 91.8 %
Platelet: 104 10*3/uL — ABNORMAL LOW (ref 150–440)
RBC: 3.81 10*6/uL — ABNORMAL LOW (ref 4.40–5.90)
RDW: 16.9 % — ABNORMAL HIGH (ref 11.5–14.5)

## 2012-10-17 LAB — BASIC METABOLIC PANEL
Anion Gap: 11 (ref 7–16)
Creatinine: 2.38 mg/dL — ABNORMAL HIGH (ref 0.60–1.30)
EGFR (African American): 26 — ABNORMAL LOW
EGFR (Non-African Amer.): 22 — ABNORMAL LOW
Glucose: 300 mg/dL — ABNORMAL HIGH (ref 65–99)
Osmolality: 287 (ref 275–301)
Potassium: 4.3 mmol/L (ref 3.5–5.1)

## 2012-10-18 DIAGNOSIS — I059 Rheumatic mitral valve disease, unspecified: Secondary | ICD-10-CM

## 2012-10-18 LAB — CBC WITH DIFFERENTIAL/PLATELET
Eosinophil #: 0 10*3/uL (ref 0.0–0.7)
Eosinophil %: 0.1 %
MCHC: 33.2 g/dL (ref 32.0–36.0)
Monocyte %: 4.5 %
Neutrophil %: 85.8 %
RBC: 3.77 10*6/uL — ABNORMAL LOW (ref 4.40–5.90)
WBC: 15.7 10*3/uL — ABNORMAL HIGH (ref 3.8–10.6)

## 2012-10-18 LAB — BASIC METABOLIC PANEL
Anion Gap: 10 (ref 7–16)
Calcium, Total: 8.4 mg/dL — ABNORMAL LOW (ref 8.5–10.1)
Chloride: 96 mmol/L — ABNORMAL LOW (ref 98–107)
EGFR (African American): 31 — ABNORMAL LOW
EGFR (Non-African Amer.): 27 — ABNORMAL LOW

## 2012-10-19 LAB — CBC WITH DIFFERENTIAL/PLATELET
Basophil %: 0 %
Eosinophil #: 0 10*3/uL (ref 0.0–0.7)
Eosinophil %: 0 %
HGB: 10.9 g/dL — ABNORMAL LOW (ref 13.0–18.0)
Lymphocyte #: 2 10*3/uL (ref 1.0–3.6)
Lymphocyte %: 13.6 %
MCH: 28.9 pg (ref 26.0–34.0)
MCHC: 33.3 g/dL (ref 32.0–36.0)
MCV: 87 fL (ref 80–100)
Monocyte #: 0.8 x10 3/mm (ref 0.2–1.0)
Monocyte %: 5.5 %
Neutrophil #: 11.7 10*3/uL — ABNORMAL HIGH (ref 1.4–6.5)
Neutrophil %: 80.9 %
Platelet: 105 10*3/uL — ABNORMAL LOW (ref 150–440)
RBC: 3.78 10*6/uL — ABNORMAL LOW (ref 4.40–5.90)
RDW: 17.3 % — ABNORMAL HIGH (ref 11.5–14.5)
WBC: 14.5 10*3/uL — ABNORMAL HIGH (ref 3.8–10.6)

## 2012-10-19 LAB — BASIC METABOLIC PANEL
Anion Gap: 10 (ref 7–16)
Calcium, Total: 9 mg/dL (ref 8.5–10.1)
Co2: 24 mmol/L (ref 21–32)
EGFR (African American): 31 — ABNORMAL LOW
EGFR (Non-African Amer.): 27 — ABNORMAL LOW
Glucose: 126 mg/dL — ABNORMAL HIGH (ref 65–99)
Osmolality: 277 (ref 275–301)
Sodium: 128 mmol/L — ABNORMAL LOW (ref 136–145)

## 2012-10-21 LAB — CULTURE, BLOOD (SINGLE)

## 2012-10-25 ENCOUNTER — Encounter: Payer: Self-pay | Admitting: Physician Assistant

## 2012-10-25 ENCOUNTER — Ambulatory Visit (INDEPENDENT_AMBULATORY_CARE_PROVIDER_SITE_OTHER): Payer: Medicare Other | Admitting: Physician Assistant

## 2012-10-25 VITALS — BP 126/71 | HR 55 | Ht 65.0 in | Wt 173.5 lb

## 2012-10-25 DIAGNOSIS — E785 Hyperlipidemia, unspecified: Secondary | ICD-10-CM

## 2012-10-25 DIAGNOSIS — I10 Essential (primary) hypertension: Secondary | ICD-10-CM

## 2012-10-25 DIAGNOSIS — I509 Heart failure, unspecified: Secondary | ICD-10-CM

## 2012-10-25 DIAGNOSIS — I5022 Chronic systolic (congestive) heart failure: Secondary | ICD-10-CM

## 2012-10-25 DIAGNOSIS — R000 Tachycardia, unspecified: Secondary | ICD-10-CM

## 2012-10-25 DIAGNOSIS — R0789 Other chest pain: Secondary | ICD-10-CM

## 2012-10-25 NOTE — Assessment & Plan Note (Signed)
Well controlled 

## 2012-10-25 NOTE — Patient Instructions (Addendum)
Please hold off on taking lisinopril.  We will obtain blood work in 1 week to re-evaluate your kidney function.   We will be in contact with Dr. Elease Hashimoto regarding the need for a blood thinner for atrial fibrillation based off of your admission at [facility].   We will otherwise see you back in 3 months as previously scheduled with Dr. Elease Hashimoto.

## 2012-10-25 NOTE — Assessment & Plan Note (Addendum)
Unspecified. He had an episode of tachycardia while asleep when admitted at [facility]. No documentation of the arrhythmia. There is a question if this was atrial fibrillation. Could have been SVT/NSVT. The importance comes with the implication of an anticoagulant if the arrhythmia was atrial fibrillation. From a post-hospital follow-up standpoint, the patient is doing well. EKG indicates NSR. Will communicate with Dr. Elease Hashimoto regarding further investigation into a-fib and treatment decisions. Hold off on anticoagulation for now. Would not hold due to age alone. He is a very functional, coordinated, cognitively in tact 79 year old.

## 2012-10-25 NOTE — Assessment & Plan Note (Addendum)
Euvolemic on exam. Continue current regimen of torsemide. Suspect a component of cardiorenal syndrome given evidence of worsening renal function. Will hold ACEi. Check BMET in 1 week. No BBs due to bradycardia. Would not attempt aldosterone antagonist d/t renal insufficiency. No hydralazine/nitrates d/t orthostatic hypotension in the past. CHF has not seemed to limit his functional capacity. He has remained stable on his current outpatient regimen. Could consider adding digoxin in the future if renal function improves off ACEi and he functional capacity is affected.

## 2012-10-25 NOTE — Progress Notes (Signed)
Patient ID: [name], male   DOB: Aug 04, 1912, 79 y.o.   MRN: [id number]            Date:  10/25/2012   ID:  [name], DOB 09-07-1912, MRN [id number]  PCP:  Warren Lacy, MD  Primary Cardiologist: Katherina Right, MD   History of Present Illness:  [name] is a very functional 79 y.o. male w/ PMHx s/f CAD (s/p CABG in 1995, PCI 2004), chronic combined CHF, moderate valvular heart disease (see below), question of atrial fibrillation, sinus bradycardia, LBBB, carotid artery dz (s/p CEA ~2009) and AAA s/p repair who presents today for follow-up.   He was admitted at [facility] 08/2012 for bronchitis and A/C combined CHF. Cardura and Lasix were held. He reports having an episode of a fast heart rate overnight for which amiodarone was started. He is unsure if he was formally diagnosed with atrial fibrillation. He was discharged on torsemide. He had been placed on heparin, but was not discharged on oral anticoagulation. He had followed up with Dr. Elease Hashimoto afterwards. Office note indicates a preference to ensure the patient truly had an episode of atrial fibrillation at [facility] before starting an oral AC. He did note the patient is steady on his feet. He alluded to conservative management of his chronic combined CHF. CRT-D was considered given LBBB and reduced EF; however, the risk was felt to outweigh the benefit.   2D echo 08/04/2012: EF 20-25%, severe diffuse HK, grade 2 diastolic dysfunction, moderate AS (Mean gradient: 13mm Hg (S). Valve area:0.96cm^2 (Vmax)), mild AI, moderate MR, mod LA dilatation, PASP 66 mmHg, mild RV systolic dysfunction, mild RA dilatation, mod PR, mod TR.   BMET 08/09/12 revealed an acute renal insufficiency (BUN 59/Cr 2.40). Baseline Cr 1.09.   He was discharged 9/17 from [facility] for sepsis 2/2 HCAP and A/C systolic CHF. He was continued on home torsemide with adequate diuresis. A 2D echo revealed LVEF < 20%, global HK, mild-mod LV dilatation, mildly decreased RV systolic  function, mod MR, mild AS, mild-mod TR/PR, moderately elevated PASP. Cr ~ 2.0 at discharge.  He has felt well since discharge. Denies chest pain, SOB/DOE, PND, orthopnea, LE edema, abdominal distention. He continues to take torsemide as prescribed. He monitors his weights daily. No increased salt or fluid intake. No episodes of palpitations, fast heart rate, lightheadedness or syncope.  EKG: sinus bradycardia, 55 bpm, LBBB, subtle ST depressions w/ TWIs V5, V6 (unchanged from prior tracings)  Wt Readings from Last 3 Encounters:  10/25/12 173 lb 8 oz (78.699 kg)  08/30/12 178 lb 8 oz (80.967 kg)  07/26/12 190 lb 4 oz (86.297 kg)     Past Medical History  Diagnosis Date  . AAA (abdominal aortic aneurysm) 2005    Open AAA repair  . S/P CABG (coronary artery bypass graft) 2003    At [facility]; PCI 2004; no LHC since 2004  . Echocardiogram abnormal 11/10    Mild LVH, EF 55%, mild AI, diastolic dysfunction  . Hypertension     Unspecified  . Hyperlipidemia     Mixed  . History of orthostatic hypotension   . Carotid artery disease     Right basal ganglia CVA 11/10; CTA neck showed 90% RICA stenosis, 75-80% LICA stenosis  . Coronary artery disease     a. s/p CABG 1995 b. PCI in 2004    Current Outpatient Prescriptions  Medication Sig Dispense Refill  . ALPRAZolam (XANAX) 0.25 MG tablet Take 0.5 mg by mouth daily.       Marland Kitchen  amiodarone (PACERONE) 200 MG tablet Take one tablet by mouth daily      . aspirin 81 MG EC tablet Take 81 mg by mouth daily.        . clopidogrel (PLAVIX) 75 MG tablet Take 75 mg by mouth daily.        . ferrous sulfate 325 (65 FE) MG tablet Take 325 mg by mouth daily.       . finasteride (PROSCAR) 5 MG tablet Take 5 mg by mouth daily.        . Multiple Vitamin (MULTIVITAMIN) tablet Take 1 tablet by mouth daily.        . potassium chloride SA (K-DUR,KLOR-CON) 20 MEQ tablet Take 1 tablet (20 mEq total) by mouth daily.  30 tablet  3  . pravastatin (PRAVACHOL) 40 MG tablet  Take 40 mg by mouth at bedtime.        . torsemide (DEMADEX) 20 MG tablet Take 20 mg by mouth daily.       No current facility-administered medications for this visit.    Allergies:   No Known Allergies  Social History:  The patient  reports that he has never smoked. He has never used smokeless tobacco. He reports that he does not drink alcohol or use illicit drugs.   Family History:  Family History  Problem Relation Age of Onset  . Diabetes Other   . Coronary artery disease Other   . Heart disease Other     Review of Systems: General: negative for chills, fever, night sweats or weight changes.  Cardiovascular:  negative for chest pain, dyspnea on exertion, edema, orthopnea, palpitations, paroxysmal nocturnal dyspnea or shortness of breath Dermatological: negative for rash Respiratory: negative for cough or wheezing Urologic:  negative for hematuria Abdominal:  negative for nausea, vomiting, diarrhea, bright red blood per rectum, melena, or hematemesis Neurologic: negative for visual changes, syncope, or dizziness All other systems reviewed and are otherwise negative except as noted above.  PHYSICAL EXAM: VS:  BP 126/71  Pulse 55  Ht 5\' 5"  (1.651 m)  Wt 173 lb 8 oz (78.699 kg)  BMI 28.87 kg/m2 Well nourished, well developed, in no acute distress HEENT: normal, PERRL Neck: no JVD or bruits Cardiac:  normal S1, S2, II/VI systolic crescendo-decrescendo murmur at RUSB, III/VI systolic apical murmur radiating to axilla; RRR; no gallops Lungs:  clear to auscultation bilaterally, no wheezing, rhonchi or rales Abd: soft, nontender, no hepatomegaly, normoactive BS x 4 quads Ext: no edema, cyanosis or clubbing Skin: warm and dry, cap refill < 2 sec Neuro:  CNs 2-12 intact, no focal abnormalities noted Musculoskeletal: strength and tone appropriate for age  Psych: normal affect

## 2012-10-25 NOTE — Assessment & Plan Note (Signed)
Continue statin. 

## 2012-11-01 ENCOUNTER — Ambulatory Visit (INDEPENDENT_AMBULATORY_CARE_PROVIDER_SITE_OTHER): Payer: Medicare Other

## 2012-11-01 DIAGNOSIS — R0789 Other chest pain: Secondary | ICD-10-CM

## 2012-11-02 ENCOUNTER — Telehealth: Payer: Self-pay

## 2012-11-02 LAB — BASIC METABOLIC PANEL
BUN/Creatinine Ratio: 16 (ref 10–22)
BUN: 29 mg/dL (ref 10–36)
CO2: 23 mmol/L (ref 18–29)
Calcium: 8.7 mg/dL (ref 8.6–10.2)
Chloride: 99 mmol/L (ref 97–108)
Creatinine, Ser: 1.82 mg/dL — ABNORMAL HIGH (ref 0.76–1.27)
GFR calc Af Amer: 35 mL/min/{1.73_m2} — ABNORMAL LOW (ref >59)
GFR calc non Af Amer: 30 mL/min/{1.73_m2} — ABNORMAL LOW (ref >59)
Glucose: 81 mg/dL (ref 65–99)
Potassium: 4.9 mmol/L (ref 3.5–5.2)
Sodium: 139 mmol/L (ref 134–144)

## 2012-11-02 NOTE — Telephone Encounter (Signed)
Message copied by Marilynne Halsted on Wed Nov 02, 2012  8:30 AM ------      Message from: [name]      Created: Wed Nov 02, 2012  8:00 AM       Creatinine improved. Please update patient. ------

## 2012-11-03 ENCOUNTER — Telehealth: Payer: Self-pay

## 2012-11-03 NOTE — Telephone Encounter (Signed)
Message copied by Marilynne Halsted on Thu Nov 03, 2012 10:29 AM ------      Message from: Odella Aquas A      Created: Wed Nov 02, 2012  8:00 AM       Creatinine improved. Please update patient. ------

## 2012-11-03 NOTE — Telephone Encounter (Signed)
Left detailed message on pt's voicemail with results and to call back with any questions or concerns.

## 2012-12-07 ENCOUNTER — Ambulatory Visit (INDEPENDENT_AMBULATORY_CARE_PROVIDER_SITE_OTHER): Payer: Medicare Other | Admitting: Cardiovascular Disease

## 2012-12-07 ENCOUNTER — Encounter: Payer: Self-pay | Admitting: Cardiovascular Disease

## 2012-12-07 VITALS — BP 129/77 | HR 59 | Ht 65.0 in | Wt 181.2 lb

## 2012-12-07 DIAGNOSIS — R000 Tachycardia, unspecified: Secondary | ICD-10-CM

## 2012-12-07 DIAGNOSIS — R0602 Shortness of breath: Secondary | ICD-10-CM

## 2012-12-07 DIAGNOSIS — I5022 Chronic systolic (congestive) heart failure: Secondary | ICD-10-CM

## 2012-12-07 DIAGNOSIS — I509 Heart failure, unspecified: Secondary | ICD-10-CM

## 2012-12-07 MED ORDER — AMIODARONE HCL 200 MG PO TABS: ORAL_TABLET | ORAL | Status: DC

## 2012-12-07 NOTE — Assessment & Plan Note (Signed)
There are some notes from [facility] that suggest that he had atrial fib.  No firm evidence that i can see in our notes.  Will decrease his dose to 200 mg on M, W, F, Sat.    Will check TSH at his next visit.

## 2012-12-07 NOTE — Progress Notes (Signed)
[name] Date of Birth  03/18/1912 [facility] [address]    [address] [place], [place]  [address] [phone]  Fax  [phone]   Problems: 1. Coronary artery disease-status post CABG 1995, status post coronary stenting in 2004. 2. Abdominal aortic aneurysm repair 3. History of carotid endarterectomy 4. CHF- EF 35-40% 5. Mitral regurgitation 6.  Aortic insufficiency 7. Sinus bradycardia 8. LBBB 9. Atrial fibrillation   History of Present Illness:  [name] is a 79 year old gentleman with a history of CAD - CABG ( 1995) and coronary stents ( 2004), AAA repair,  peripheral vascular disease. He was seen in our office 6 months ago. He had carotid endarterectomy last year.  He's been having some problems with shortness of breath he recently. The shortness of breath is especially bad at night.  He also has noted some swelling in his ankles.  He had bronchitis about 6 weeks ago and has not recovered completely since that time.   He's been using an inhaler twice a day for the past several weeks.  He eats out at restaurants approximately one meal each day. He may be getting a little bit of extra salt. He does not add salt to his food.  He occasionally has some episodes of very mild chest tightness. It is not associated with any specific activities such as exercise, eating, drinking. He has not tried any nitroglycerin for that tightness yet.  His echocardiogram performed following his last visit revealed an ejection fraction between 35 and 40%. He has mild to moderate Daniel, mild to moderate TR, and mild to moderate aortic insufficiency.  September 30, 2011 We started him on Lasix 20 mg a day last year and he feels much better.  He notes a bit of swelling particularly in his left foot.  March 10, 2012: [name] presents today for evaluation of some palpitations and some lightheadedness. He's been having dizziness for the past several weeks. He still able to do all  of his normal activities.  Jun 07, 2012: [name] continues to do well.  He put in a garden this year ( tomatoes, peppers, cucs, onions) .   He has stopped his eye drops since we last saw him for a HR of 48.  His HR has now improved to 72.  August 30, 2012  [name] was hospitalized with CHF at [facility] last week.  He also had atrial fibrillation and was DCd on amiodarone.   The cardura and furosemide were stopped.  He was placed on Torsemide instead of Lasix.     He is feeling better at this point.    Nov. 5, 2014:  He was seen by [place] about 1 month ago.  He had in the hospital at [facility] for CHF.   He developed cardiorenal syndrome.    His ACE-inhibitor was stopped.   He is feeling much better.  He was out mowing with the riding mower this week .  Creatinine had improved slightly  - still not back to baseline.  He tries to avoid salt.    Current Outpatient Prescriptions on File Prior to Visit  Medication Sig Dispense Refill  . ALPRAZolam (XANAX) 0.25 MG tablet Take 0.5 mg by mouth daily.       Marland Kitchen amiodarone (PACERONE) 200 MG tablet Take one tablet by mouth daily      . aspirin 81 MG EC tablet Take 81 mg by mouth daily.        . clopidogrel (  PLAVIX) 75 MG tablet Take 75 mg by mouth daily.        . ferrous sulfate 325 (65 FE) MG tablet Take 325 mg by mouth daily.       . finasteride (PROSCAR) 5 MG tablet Take 5 mg by mouth daily.        . Multiple Vitamin (MULTIVITAMIN) tablet Take 1 tablet by mouth daily.        . potassium chloride SA (K-DUR,KLOR-CON) 20 MEQ tablet Take 1 tablet (20 mEq total) by mouth daily.  30 tablet  3  . pravastatin (PRAVACHOL) 40 MG tablet Take 40 mg by mouth at bedtime.        . torsemide (DEMADEX) 20 MG tablet Take 20 mg by mouth daily.       No current facility-administered medications on file prior to visit.    No Known Allergies  Past Medical History  Diagnosis Date  . AAA (abdominal aortic aneurysm) 2005    Open AAA repair  . S/P CABG (coronary artery bypass  graft) 2003    At [facility]; PCI 2004; no LHC since 2004  . Echocardiogram abnormal 11/10    Mild LVH, EF 55%, mild AI, diastolic dysfunction  . Hypertension     Unspecified  . Hyperlipidemia     Mixed  . History of orthostatic hypotension   . Carotid artery disease     Right basal ganglia CVA 11/10; CTA neck showed 90% RICA stenosis, 75-80% LICA stenosis  . Coronary artery disease     a. s/p CABG 1995 b. PCI in 2004  . Hx of bronchitis   . Pneumonia     HISTORY    Past Surgical History  Procedure Laterality Date  . Coronary artery bypass graft  2003  . Abdominal aortic aneurysm repair  2005    History  Smoking status  . Never Smoker   Smokeless tobacco  . Never Used    History  Alcohol Use No    Family History  Problem Relation Age of Onset  . Diabetes Other   . Coronary artery disease Other   . Heart disease Other     Reviw of Systems:  Reviewed in the HPI.  All other systems are negative.  Physical Exam: BP 129/77  Pulse 59  Ht 5\' 5"  (1.651 m)  Wt 181 lb 4 oz (82.214 kg)  BMI 30.16 kg/m2 The patient is alert and oriented x 3.  The mood and affect are normal.   Skin: warm and dry.  Color is normal.    HEENT:   Normocephalic/atraumatic. His carotids are normal.  He has a well healed CEA scar on his right neck.   Lungs: Clear to auscultation.  Heart: Regular rate S1-S2. He has a soft 2/6 systolic murmur radiating to the upper right sternal border.     Abdomen: His abdominal exam reveals good bowel sounds.  Extremities:  His no clubbing cyanosis. He has no leg edema.   Neuro:  Neuro exam is nonfocal. He is hard of hearing.    ECG: 08/30/2012: Normal sinus rhythm at 67 beats a minute. He has left bundle branch block  Assessment / Plan:

## 2012-12-07 NOTE — Patient Instructions (Addendum)
LAB TODAY; BMET  DECREASE AMIODARONE TO 200 MG 1 TABLET ON MON, WED, FRI AND SAT ONLY  PLEASE FOLLOW UP WITH DR. Elease Hashimoto IN 3 MONTHS WITH AN EKG AND LAB WORK (BMET, TSH) SAME DAY

## 2012-12-07 NOTE — Assessment & Plan Note (Signed)
[name] is doing well - holding his own.  Will continue current meds for CHF.  Will check BMP today.  i'll see him in 3 months for OV, BMP, TSH.

## 2012-12-08 ENCOUNTER — Other Ambulatory Visit: Payer: Self-pay

## 2012-12-08 LAB — BASIC METABOLIC PANEL
BUN/Creatinine Ratio: 25 — ABNORMAL HIGH (ref 10–22)
CO2: 26 mmol/L (ref 18–29)
Chloride: 98 mmol/L (ref 97–108)
Potassium: 5.2 mmol/L (ref 3.5–5.2)
Sodium: 141 mmol/L (ref 134–144)

## 2012-12-09 ENCOUNTER — Telehealth: Payer: Self-pay

## 2012-12-09 NOTE — Telephone Encounter (Signed)
Message copied by Marilynne Halsted on Fri Dec 09, 2012  5:02 PM ------      Message from: [place], [place] J      Created: Fri Dec 09, 2012 12:14 PM       His renal function is still not as good as it was.  He is stable from a clinical standpoint.       ------

## 2012-12-09 NOTE — Telephone Encounter (Signed)
Spoke w/ pt.  He is aware of results.  

## 2012-12-23 ENCOUNTER — Other Ambulatory Visit: Payer: Self-pay | Admitting: Cardiovascular Disease

## 2012-12-23 ENCOUNTER — Other Ambulatory Visit: Payer: Self-pay | Admitting: *Deleted

## 2012-12-23 MED ORDER — POTASSIUM CHLORIDE CRYS ER 20 MEQ PO TBCR: 20.0000 meq | EXTENDED_RELEASE_TABLET | Freq: Every day | ORAL | Status: DC

## 2012-12-23 NOTE — Telephone Encounter (Signed)
Requested Prescriptions   Signed Prescriptions Disp Refills  . potassium chloride SA (K-DUR,KLOR-CON) 20 MEQ tablet 30 tablet 3    Sig: Take 1 tablet (20 mEq total) by mouth daily.    Authorizing Provider: Vesta Mixer    Ordering User: Kendrick Fries

## 2013-03-08 ENCOUNTER — Ambulatory Visit (INDEPENDENT_AMBULATORY_CARE_PROVIDER_SITE_OTHER): Payer: Medicare Other | Admitting: Cardiovascular Disease

## 2013-03-08 ENCOUNTER — Encounter: Payer: Self-pay | Admitting: Cardiovascular Disease

## 2013-03-08 ENCOUNTER — Other Ambulatory Visit: Payer: Medicare Other

## 2013-03-08 VITALS — BP 110/60 | HR 58 | Ht 65.0 in | Wt 182.8 lb

## 2013-03-08 DIAGNOSIS — I509 Heart failure, unspecified: Secondary | ICD-10-CM

## 2013-03-08 DIAGNOSIS — I5022 Chronic systolic (congestive) heart failure: Secondary | ICD-10-CM

## 2013-03-08 MED ORDER — POTASSIUM CHLORIDE ER 10 MEQ PO TBCR: 10.0000 meq | EXTENDED_RELEASE_TABLET | Freq: Every day | ORAL | Status: DC

## 2013-03-08 NOTE — Patient Instructions (Addendum)
Your physician recommends that you schedule a follow-up appointment in:  3 months   Your physician recommends that you return for lab work in:  Today: BMP  1 months: BMP  Your physician has recommended you make the following change in your medication:  Decrease Potassium to 10 mg daily

## 2013-03-08 NOTE — Assessment & Plan Note (Signed)
[name] continues to be challenged by salt restriction. He's to put salt on many of his foods. I've persuaded him to use a salt substitute. We will decrease his potassium chloride to 10 mEq a day.  He has leg edema today which has responded somewhat to his increased dose of Demadex. I've cautioned him about continuing to increase his dose of Demadex and she's had cardiorenal syndrome in the past.  We'll have him get some potassium chloride salt substitute. We'll check a basic metabolic profile today and again in 1 month. I've encouraged him to elevate his legs. I'll see him again in 3 months for followup visit.  His heart rate is fairly slow but he is asymptomatic.    He  is not having any episodes of syncope or presyncope.

## 2013-03-08 NOTE — Progress Notes (Signed)
[name] Date of Birth  Dec 21, 1912 [facility] [address]    [address] [place], [place]  [id number] [phone]  Fax  [phone]   Problems: 1. Coronary artery disease-status post CABG 1995, status post coronary stenting in 2004. 2. Abdominal aortic aneurysm repair 3. History of carotid endarterectomy 4. CHF- EF 35-40% 5. Mitral regurgitation 6.  Aortic insufficiency 7. Sinus bradycardia 8. LBBB 9. Atrial fibrillation   History of Present Illness:  [name] is a 80 year old gentleman with a history of CAD - CABG ( 1995) and coronary stents ( 2004), AAA repair,  peripheral vascular disease. He was seen in our office 6 months ago. He had carotid endarterectomy last year.  He's been having some problems with shortness of breath he recently. The shortness of breath is especially bad at night.  He also has noted some swelling in his ankles.  He had bronchitis about 6 weeks ago and has not recovered completely since that time.   He's been using an inhaler twice a day for the past several weeks.  He eats out at restaurants approximately one meal each day. He may be getting a little bit of extra salt. He does not add salt to his food.  He occasionally has some episodes of very mild chest tightness. It is not associated with any specific activities such as exercise, eating, drinking. He has not tried any nitroglycerin for that tightness yet.  His echocardiogram performed following his last visit revealed an ejection fraction between 35 and 40%. He has mild to moderate MR, mild to moderate TR, and mild to moderate aortic insufficiency.  September 30, 2011 We started him on Lasix 20 mg a day last year and he feels much better.  He notes a bit of swelling particularly in his left foot.  March 10, 2012: [name] presents today for evaluation of some palpitations and some lightheadedness. He's been having dizziness for the past several weeks. He still able to do all  of his normal activities.  Jun 07, 2012: [name] continues to do well.  He put in a garden this year ( tomatoes, peppers, cucs, onions) .   He has stopped his eye drops since we last saw him for a HR of 48.  His HR has now improved to 72.  August 30, 2012  [name] was [facility] with [facility].  He also had atrial fibrillation and was DCd on amiodarone.   The cardura and furosemide were stopped.  He was placed on Torsemide instead of Lasix.     He is feeling better at this point.    Nov. 5, 2014:  He was seen by [facility] about 1 month ago.  He had [facility] the [facility].   He developed cardiorenal syndrome.    His ACE-inhibitor was stopped.   He is feeling much better.  He was out mowing with the riding mower this week .  Creatinine had improved slightly  - still not back to baseline.  He tries to avoid salt.   Feb. 4, 2015:  [name] is doing okay. He continues to eat some extra salt. On his own, he increase his Demadex to 40 mg in the morning.  He still eats sausage in the mornings. Also put salt on his aches. That long discussion about getting salt substitute.  His eye doctor has also started him on timolol eyedrops.  Current Outpatient Prescriptions on File Prior to Visit  Medication Sig Dispense Refill  . ALPRAZolam (XANAX) 0.25 MG tablet Take 0.5 mg by mouth daily.       Marland Kitchen amiodarone (PACERONE) 200 MG tablet Take 200 mg on Mon, Wed, Fri and Sat      . aspirin 81 MG EC tablet Take 81 mg by mouth daily.        . clopidogrel (PLAVIX) 75 MG tablet Take 75 mg by mouth daily.        . ferrous sulfate 325 (65 FE) MG tablet Take 325 mg by mouth daily.       . finasteride (PROSCAR) 5 MG tablet Take 5 mg by mouth daily.        . Ibuprofen-Diphenhydramine Cit (IBUPROFEN PM) 200-38 MG TABS Take by mouth at bedtime.      Marland Kitchen KLOR-CON M20 20 MEQ tablet TAKE ONE TABLET BY MOUTH ONCE DAILY  30 tablet  0  . pravastatin (PRAVACHOL) 40 MG tablet Take 40 mg by mouth at bedtime.         . torsemide (DEMADEX) 20 MG tablet Take 40 mg by mouth daily.        No current facility-administered medications on file prior to visit.    No Known Allergies  Past Medical History  Diagnosis Date  . AAA (abdominal aortic aneurysm) 2005    Open AAA repair  . S/P CABG (coronary artery bypass graft) 2003    At [facility]; PCI 2004; no LHC since 2004  . Echocardiogram abnormal 11/10    Mild LVH, EF 55%, mild AI, diastolic dysfunction  . Hypertension     Unspecified  . Hyperlipidemia     Mixed  . History of orthostatic hypotension   . Carotid artery disease     Right basal ganglia CVA 11/10; CTA neck showed 90% RICA stenosis, 75-80% LICA stenosis  . Coronary artery disease     a. s/p CABG 1995 b. PCI in 2004  . Hx of bronchitis   . Pneumonia     HISTORY    Past Surgical History  Procedure Laterality Date  . Coronary artery bypass graft  2003  . Abdominal aortic aneurysm repair  2005    History  Smoking status  . Never Smoker   Smokeless tobacco  . Never Used    History  Alcohol Use No    Family History  Problem Relation Age of Onset  . Diabetes Other   . Coronary artery disease Other   . Heart disease Other     Reviw of Systems:  Reviewed in the HPI.  All other systems are negative.  Physical Exam: BP 110/60  Pulse 58  Ht 5\' 5"  (1.651 m)  Wt 182 lb 12 oz (82.895 kg)  BMI 30.41 kg/m2 The patient is alert and oriented x 3.  The mood and affect are normal.   Skin: warm and dry.  Color is normal.    HEENT:   Normocephalic/atraumatic. His carotids are normal.  He has a well healed CEA scar on his right neck.   Lungs: Clear to auscultation.  Heart: Regular rate S1-S2. He has a soft 2/6 systolic murmur radiating to the upper right sternal border.     Abdomen: His abdominal exam reveals good bowel sounds.  Extremities:  His no clubbing cyanosis.   He has 1-2+ edema - left > right.   Neuro:  Neuro exam is nonfocal. He is hard of hearing.     ECG:   Assessment / Plan:

## 2013-03-09 ENCOUNTER — Other Ambulatory Visit: Payer: Medicare Other

## 2013-03-09 ENCOUNTER — Ambulatory Visit: Payer: Medicare Other | Admitting: Cardiovascular Disease

## 2013-03-09 LAB — BASIC METABOLIC PANEL
BUN / CREAT RATIO: 25 — AB (ref 10–22)
BUN: 47 mg/dL — AB (ref 10–36)
CO2: 25 mmol/L (ref 18–29)
CREATININE: 1.87 mg/dL — AB (ref 0.76–1.27)
Calcium: 8.9 mg/dL (ref 8.6–10.2)
Chloride: 96 mmol/L — ABNORMAL LOW (ref 97–108)
GFR, EST AFRICAN AMERICAN: 34 mL/min/{1.73_m2} — AB (ref >59)
GFR, EST NON AFRICAN AMERICAN: 29 mL/min/{1.73_m2} — AB (ref >59)
Glucose: 67 mg/dL (ref 65–99)
Potassium: 4.8 mmol/L (ref 3.5–5.2)
SODIUM: 140 mmol/L (ref 134–144)

## 2013-04-06 ENCOUNTER — Ambulatory Visit (INDEPENDENT_AMBULATORY_CARE_PROVIDER_SITE_OTHER): Payer: Medicare Other | Admitting: *Deleted

## 2013-04-06 ENCOUNTER — Telehealth: Payer: Self-pay | Admitting: *Deleted

## 2013-04-06 DIAGNOSIS — R0602 Shortness of breath: Secondary | ICD-10-CM

## 2013-04-06 DIAGNOSIS — I5022 Chronic systolic (congestive) heart failure: Secondary | ICD-10-CM

## 2013-04-06 DIAGNOSIS — I509 Heart failure, unspecified: Secondary | ICD-10-CM

## 2013-04-06 MED ORDER — AMIODARONE HCL 200 MG PO TABS: ORAL_TABLET | ORAL | Status: DC

## 2013-04-06 MED ORDER — AMIODARONE HCL 200 MG PO TABS
ORAL_TABLET | ORAL | Status: DC
Start: 2013-04-06 — End: 2013-06-12

## 2013-04-06 NOTE — Telephone Encounter (Signed)
Please have him take an extra dose of torsemide tonight and BID tomorrow.  Then back down to his normal dose.  He does need to watch his salt better.  I would recommend that he avoid fried seafood.  Thanks

## 2013-04-06 NOTE — Telephone Encounter (Signed)
Patient came in for labs and stated he has gained 5 lbs this week. Patients ankles were visibly swollen. He said that he has been to a seafood restaurant more than once this week and had a fried seafood platter and while he does not add salt to food he is eating items at home like canned soup on a regular basis.   Thoroughly reviewed low sodium diet education with patient. Instructed him to follow a strict low sodium diet and call next week if the swelling and weight gain does not resolve. Patient verbalized understanding.

## 2013-04-06 NOTE — Telephone Encounter (Signed)
Patient verbalized understanding  

## 2013-04-07 LAB — BASIC METABOLIC PANEL
BUN/Creatinine Ratio: 26 — ABNORMAL HIGH (ref 10–22)
BUN: 51 mg/dL — AB (ref 10–36)
CO2: 26 mmol/L (ref 18–29)
Calcium: 8.8 mg/dL (ref 8.6–10.2)
Chloride: 98 mmol/L (ref 97–108)
Creatinine, Ser: 2 mg/dL — ABNORMAL HIGH (ref 0.76–1.27)
GFR, EST AFRICAN AMERICAN: 31 mL/min/{1.73_m2} — AB (ref >59)
GFR, EST NON AFRICAN AMERICAN: 27 mL/min/{1.73_m2} — AB (ref >59)
Glucose: 67 mg/dL (ref 65–99)
Potassium: 3.9 mmol/L (ref 3.5–5.2)
SODIUM: 142 mmol/L (ref 134–144)

## 2013-05-04 ENCOUNTER — Telehealth: Payer: Self-pay

## 2013-05-04 NOTE — Telephone Encounter (Signed)
Agree, it sounds like he could try a better diet or expect the weight gain / leg edema

## 2013-05-04 NOTE — Telephone Encounter (Signed)
Pt called and states his weight has increased 4-5 lbs in about 2 weeks. States he has a lot of swelling, states his legs feel very tight when he bends his knee.

## 2013-05-04 NOTE — Telephone Encounter (Signed)
Informed patient that Dr. Elease HashimotoNahser agreed with following the low sodium diet  Instructed patient to call if symptoms became worse or the seek EMS if he felt his situation became emergent  Patient verbalized understanding

## 2013-05-04 NOTE — Telephone Encounter (Signed)
Patient stated he has gained 4-5 lbs in 2 weeks. He stated he has "lots of swelling and it feels tight when he bends his knees" He said he has mild shortness of breath  I asked him if he has been taking his medication as ordered, he stated that he has  I asked if he has been following a low sodium diet, he stated that he "thinks he is" I asked to review his meals for the last 48 hrs  He stated he had fried pork chops, a fried seafood platter, and a fried chicken  I reviewed low sodium diet with patient and informed him that the foods he has been choosing are high in sodium and can cause weight gain and swelling.  Patient verbalized understanding and discussed his new low sodium diet plan with me  I told him I would inform Dr. Elease HashimotoNahser of his current symptoms incase he wanted to make any medication changes

## 2013-06-12 ENCOUNTER — Other Ambulatory Visit: Payer: Self-pay | Admitting: *Deleted

## 2013-06-12 ENCOUNTER — Ambulatory Visit (INDEPENDENT_AMBULATORY_CARE_PROVIDER_SITE_OTHER): Payer: Medicare Other | Admitting: Cardiovascular Disease

## 2013-06-12 ENCOUNTER — Encounter: Payer: Self-pay | Admitting: Cardiovascular Disease

## 2013-06-12 VITALS — BP 112/72 | HR 44 | Ht 65.0 in | Wt 176.0 lb

## 2013-06-12 DIAGNOSIS — I5022 Chronic systolic (congestive) heart failure: Secondary | ICD-10-CM

## 2013-06-12 DIAGNOSIS — I498 Other specified cardiac arrhythmias: Secondary | ICD-10-CM

## 2013-06-12 DIAGNOSIS — R002 Palpitations: Secondary | ICD-10-CM

## 2013-06-12 DIAGNOSIS — R0602 Shortness of breath: Secondary | ICD-10-CM

## 2013-06-12 DIAGNOSIS — I509 Heart failure, unspecified: Secondary | ICD-10-CM

## 2013-06-12 MED ORDER — POTASSIUM CHLORIDE ER 10 MEQ PO TBCR: 10.0000 meq | EXTENDED_RELEASE_TABLET | Freq: Every day | ORAL | Status: DC

## 2013-06-12 MED ORDER — AMIODARONE HCL 200 MG PO TABS: ORAL_TABLET | ORAL | Status: DC

## 2013-06-12 MED ORDER — AMIODARONE HCL 200 MG PO TABS: 200.0000 mg | ORAL_TABLET | ORAL | Status: DC

## 2013-06-12 NOTE — Patient Instructions (Signed)
Your physician has recommended you make the following change in your medication:  Decrease Amiodarone to 200 mg once a week   Your physician recommends that you schedule a follow-up appointment in:  3 months

## 2013-06-12 NOTE — Assessment & Plan Note (Signed)
His heart his symptoms have greatly improved since he has stopped eating   salt. He also has not required any of the extra Demadex that he was previously taking when he became so volume overloaded. Because of this, his renal function has stabilized.  I Am very pleased is stable cardiac condition.  Continue with current  current medications. I'll see him again in 3 months.

## 2013-06-12 NOTE — Telephone Encounter (Signed)
Requested Prescriptions   Signed Prescriptions Disp Refills  . potassium chloride (K-DUR) 10 MEQ tablet 30 tablet 3    Sig: Take 1 tablet (10 mEq total) by mouth daily.    Authorizing Provider: Vesta MixerNAHSER, PHILIP J    Ordering User: Kendrick FriesLOPEZ, Shantella Blubaugh C

## 2013-06-12 NOTE — Progress Notes (Signed)
Daniel HeftyLonnie E Bullock Date of Birth  December 04, 1912 [facility] [address]    [address] [place], [place]  [id number] [phone]  Fax  [phone]   Problems: 1. Coronary artery disease-status post CABG 1995, status post coronary stenting in 2004. 2. Abdominal aortic aneurysm repair 3. History of carotid endarterectomy 4. CHF- EF 35-40% 5. Mitral regurgitation 6.  Aortic insufficiency 7. Sinus bradycardia 8. LBBB 9. Atrial fibrillation   History of Present Illness:  [name] is a 80 year old gentleman with a history of CAD - CABG ( 1995) and coronary stents ( 2004), AAA repair,  peripheral vascular disease. He was seen in our office 6 months ago. He had carotid endarterectomy last year.  He's been having some problems with shortness of breath he recently. The shortness of breath is especially bad at night.  He also has noted some swelling in his ankles.  He had bronchitis about 6 weeks ago and has not recovered completely since that time.   He's been using an inhaler twice a day for the past several weeks.  He eats out at restaurants approximately one meal each day. He may be getting a little bit of extra salt. He does not add salt to his food.  He occasionally has some episodes of very mild chest tightness. It is not associated with any specific activities such as exercise, eating, drinking. He has not tried any nitroglycerin for that tightness yet.  His echocardiogram performed following his last visit revealed an ejection fraction between 35 and 40%. He has mild to moderate MR, mild to moderate TR, and mild to moderate aortic insufficiency.  September 30, 2011 We started him on Lasix 20 mg a day last year and he feels much better.  He notes a bit of swelling particularly in his left foot.  March 10, 2012: [name] presents today for evaluation of some palpitations and some lightheadedness. He's been having dizziness for the past several weeks. He still able to do all  of his normal activities.  Jun 07, 2012: [name] continues to do well.  He put in a garden this year ( tomatoes, peppers, cucs, onions) .   He has stopped his eye drops since we last saw him for a HR of 48.  His HR has now improved to 72.  August 30, 2012  [name] was [facility] with [facility] at [facility].  He also had atrial fibrillation and was DCd on amiodarone.   The cardura and furosemide were stopped.  He was placed on Torsemide instead of Lasix.     He is feeling better at this point.    Nov. 5, 2014:  He was seen by [name] about 1 month ago.  [facility].   He developed cardiorenal syndrome.    His ACE-inhibitor was stopped.   He is feeling much better.  He was out mowing with the riding mower this week .  Creatinine had improved slightly  - still not back to baseline.  He tries to avoid salt.   Feb. 4, 2015:  [name] is doing okay. He continues to eat some extra salt. On his own, he increase his Demadex to 40 mg in the morning.  He still eats sausage in the mornings. Also put salt on his eggs. . That long discussion about getting salt substitute.  His eye doctor has also started him on timolol eyedrops.  Jun 12, 2013:  [name] is about  the same.  He has continued to have issues with volume overload.   He's cut out all of his extra salt and is feeling quite a bit better.   His eye doctor put him back on Timolol eye drops and as a result, his HR is quite a bit lower today.    Current Outpatient Prescriptions on File Prior to Visit  Medication Sig Dispense Refill  . ALPRAZolam (XANAX) 0.25 MG tablet Takes 2 tablet daily at bedtime.      Marland Kitchen. amiodarone (PACERONE) 200 MG tablet Take 200 mg on Mon, Wed, Fri and Sat  30 tablet  3  . aspirin 81 MG EC tablet Take 81 mg by mouth daily.        . Cholecalciferol (VITAMIN D3) 2000 UNITS capsule Take 2,000 Units by mouth daily.      . clopidogrel (PLAVIX) 75 MG tablet Take 75 mg by mouth daily.        . ferrous  sulfate 325 (65 FE) MG tablet Take 325 mg by mouth daily.       . finasteride (PROSCAR) 5 MG tablet Take 5 mg by mouth daily.        . Ibuprofen-Diphenhydramine Cit (IBUPROFEN PM) 200-38 MG TABS Take by mouth at bedtime.      . potassium chloride (K-DUR) 10 MEQ tablet Take 1 tablet (10 mEq total) by mouth daily.  90 tablet  3  . pravastatin (PRAVACHOL) 40 MG tablet Take 40 mg by mouth at bedtime.        . torsemide (DEMADEX) 20 MG tablet Take 40 mg by mouth as needed.        No current facility-administered medications on file prior to visit.    No Known Allergies  Past Medical History  Diagnosis Date  . AAA (abdominal aortic aneurysm) 2005    Open AAA repair  . S/P CABG (coronary artery bypass graft) 2003    [facility]; PCI 2004; no LHC since 2004  . Echocardiogram abnormal 11/10    Mild LVH, EF 55%, mild AI, diastolic dysfunction  . Hypertension     Unspecified  . Hyperlipidemia     Mixed  . History of orthostatic hypotension   . Carotid artery disease     Right basal ganglia CVA 11/10; CTA neck showed 90% RICA stenosis, 75-80% LICA stenosis  . Coronary artery disease     a. s/p CABG 1995 b. PCI in 2004  . Hx of bronchitis   . Pneumonia     HISTORY    Past Surgical History  Procedure Laterality Date  . Coronary artery bypass graft  2003  . Abdominal aortic aneurysm repair  2005    History  Smoking status  . Never Smoker   Smokeless tobacco  . Never Used    History  Alcohol Use No    Family History  Problem Relation Age of Onset  . Diabetes Other   . Coronary artery disease Other   . Heart disease Other     Reviw of Systems:  Reviewed in the HPI.  All other systems are negative.  Physical Exam: BP 112/72  Pulse 44  Ht 5\' 5"  (1.651 m)  Wt 176 lb (79.833 kg)  BMI 29.29 kg/m2 The patient is alert and oriented x 3.  The mood and affect are normal.   Skin: warm and dry.  Color is normal.    HEENT:   Normocephalic/atraumatic. His carotids are normal.  He  has a well healed CEA scar  on his right neck.   Lungs: Clear to auscultation.  Heart: Regular rate S1-S2. He has a soft 2/6 systolic murmur radiating to the upper right sternal border.     Abdomen: His abdominal exam reveals good bowel sounds.  Extremities:  His no clubbing cyanosis.   He has no leg edema today.   Neuro:  Neuro exam is nonfocal. He is hard of hearing.    ECG:  Jun 12, 2013:  Sinus brady at 63. LBBB.   Assessment / Plan:

## 2013-06-12 NOTE — Assessment & Plan Note (Signed)
Has chronic bradycardia. He's back on timolol eyedrops which has caused his heart rate to go even slower. Apparently he was having more problems with glaucoma.  He has moderate renal insufficiency. Small is excreted in the urine at least to some degree. I wonder if he could be placed on a lower dose of Tilolol  eyedrops and achieve the same effect from an ophthalmologic standpoint.  I would like for his HR to be above 50 if possible.  We will decrease his dose of amiodarone to 200 mg once a week which should help with his bradycardia

## 2013-08-05 ENCOUNTER — Other Ambulatory Visit: Payer: Self-pay | Admitting: Cardiovascular Disease

## 2013-08-29 ENCOUNTER — Encounter: Payer: Self-pay | Admitting: Cardiovascular Disease

## 2013-08-29 ENCOUNTER — Ambulatory Visit (INDEPENDENT_AMBULATORY_CARE_PROVIDER_SITE_OTHER): Payer: Medicare Other | Admitting: Cardiovascular Disease

## 2013-08-29 VITALS — BP 120/76 | HR 48 | Ht 65.0 in | Wt 167.5 lb

## 2013-08-29 DIAGNOSIS — I5022 Chronic systolic (congestive) heart failure: Secondary | ICD-10-CM

## 2013-08-29 DIAGNOSIS — I509 Heart failure, unspecified: Secondary | ICD-10-CM

## 2013-08-29 DIAGNOSIS — I498 Other specified cardiac arrhythmias: Secondary | ICD-10-CM

## 2013-08-29 MED ORDER — POTASSIUM CHLORIDE ER 20 MEQ PO TBCR: 10.0000 meq | EXTENDED_RELEASE_TABLET | Freq: Every day | ORAL | Status: DC

## 2013-08-29 NOTE — Assessment & Plan Note (Signed)
Remains asymptomatic. His bradycardia has been worsened slightly boggy timolol eyedrops for his vision.  His lungs have any symptoms we'll continue to follow.

## 2013-08-29 NOTE — Patient Instructions (Signed)
Your physician wants you to follow-up in:  6 months. You will receive a reminder letter in the mail two months in advance. If you don't receive a letter, please call our office to schedule the follow-up appointment.   

## 2013-08-29 NOTE — Progress Notes (Signed)
[name] Date of Birth  December 04, 1912 [facility] [address]    [address] [place], [place]  [id number] [phone]  Fax  [phone]   Problems: 1. Coronary artery disease-status post CABG 1995, status post coronary stenting in 2004. 2. Abdominal aortic aneurysm repair 3. History of carotid endarterectomy 4. CHF- EF 35-40% 5. Mitral regurgitation 6.  Aortic insufficiency 7. Sinus bradycardia 8. LBBB 9. Atrial fibrillation   History of Present Illness:  [name] is a 80 year old gentleman with a history of CAD - CABG ( 1995) and coronary stents ( 2004), AAA repair,  peripheral vascular disease. He was seen in our office 6 months ago. He had carotid endarterectomy last year.  He's been having some problems with shortness of breath he recently. The shortness of breath is especially bad at night.  He also has noted some swelling in his ankles.  He had bronchitis about 6 weeks ago and has not recovered completely since that time.   He's been using an inhaler twice a day for the past several weeks.  He eats out at restaurants approximately one meal each day. He may be getting a little bit of extra salt. He does not add salt to his food.  He occasionally has some episodes of very mild chest tightness. It is not associated with any specific activities such as exercise, eating, drinking. He has not tried any nitroglycerin for that tightness yet.  His echocardiogram performed following his last visit revealed an ejection fraction between 35 and 40%. He has mild to moderate Daniel, mild to moderate TR, and mild to moderate aortic insufficiency.  September 30, 2011 We started him on Lasix 20 mg a day last year and he feels much better.  He notes a bit of swelling particularly in his left foot.  March 10, 2012: [name] presents today for evaluation of some palpitations and some lightheadedness. He's been having dizziness for the past several weeks. He still able to do all  of his normal activities.  Jun 07, 2012: [name] continues to do well.  He put in a garden this year ( tomatoes, peppers, cucs, onions) .   He has stopped his eye drops since we last saw him for a HR of 48.  His HR has now improved to 72.  August 30, 2012  [name] was hospitalized with CHF at [facility] last week.  He also had atrial fibrillation and was DCd on amiodarone.   The cardura and furosemide were stopped.  He was placed on Torsemide instead of Lasix.     He is feeling better at this point.    Nov. 5, 2014:  He was seen by [name] about 1 month ago.  [facility].   He developed cardiorenal syndrome.    His ACE-inhibitor was stopped.   He is feeling much better.  He was out mowing with the riding mower this week .  Creatinine had improved slightly  - still not back to baseline.  He tries to avoid salt.   Feb. 4, 2015:  [name] is doing okay. He continues to eat some extra salt. On his own, he increase his Demadex to 40 mg in the morning.  He still eats sausage in the mornings. Also put salt on his eggs. . That long discussion about getting salt substitute.  His eye doctor has also started him on timolol eyedrops.  Jun 12, 2013:  [name] is about  the same.  He has continued to have issues with volume overload.   He's cut out all of his extra salt and is feeling quite a bit better.   His eye doctor put him back on Timolol eye drops and as a result, his HR is quite a bit lower today.    08/29/2013:  He had a bad episode of gout  [facility].    He was give motrin and a narcotic but no colchicine.   He has been losing weight slowly.    He is feeling well      Current Outpatient Prescriptions on File Prior to Visit  Medication Sig Dispense Refill  . amiodarone (PACERONE) 200 MG tablet Take one 200 mg tablet once a week  30 tablet  3  . aspirin 81 MG EC tablet Take 81 mg by mouth daily.        . clopidogrel (PLAVIX) 75 MG tablet Take 75 mg  by mouth daily.        . finasteride (PROSCAR) 5 MG tablet Take 5 mg by mouth daily.        . Ibuprofen-Diphenhydramine Cit (IBUPROFEN PM) 200-38 MG TABS Take by mouth at bedtime.      . potassium chloride (K-DUR) 10 MEQ tablet Take 1 tablet (10 mEq total) by mouth daily.  30 tablet  3  . pravastatin (PRAVACHOL) 40 MG tablet Take 40 mg by mouth daily.       Marland Kitchen. torsemide (DEMADEX) 20 MG tablet Take 20 mg by mouth daily.        No current facility-administered medications on file prior to visit.    No Known Allergies  Past Medical History  Diagnosis Date  . AAA (abdominal aortic aneurysm) 2005    Open AAA repair  . S/P CABG (coronary artery bypass graft) 2003    [facility]; PCI 2004; no LHC since 2004  . Echocardiogram abnormal 11/10    Mild LVH, EF 55%, mild AI, diastolic dysfunction  . Hypertension     Unspecified  . Hyperlipidemia     Mixed  . History of orthostatic hypotension   . Carotid artery disease     Right basal ganglia CVA 11/10; CTA neck showed 90% RICA stenosis, 75-80% LICA stenosis  . Coronary artery disease     a. s/p CABG 1995 b. PCI in 2004  . Hx of bronchitis   . Pneumonia     HISTORY  . Gout     Past Surgical History  Procedure Laterality Date  . Coronary artery bypass graft  2003  . Abdominal aortic aneurysm repair  2005    History  Smoking status  . Never Smoker   Smokeless tobacco  . Never Used    History  Alcohol Use No    Family History  Problem Relation Age of Onset  . Diabetes Other   . Coronary artery disease Other   . Heart disease Other     Reviw of Systems:  Reviewed in the HPI.  All other systems are negative.  Physical Exam: BP 120/76  Pulse 48  Ht 5\' 5"  (1.651 m)  Wt 167 lb 8 oz (75.978 kg)  BMI 27.87 kg/m2 The patient is alert and oriented x 3.  The mood and affect are normal.   Skin: warm and dry.  Color is normal.    HEENT:   Normocephalic/atraumatic. His carotids are normal.  He has a well healed CEA scar on his  right neck.  Lungs: Clear to auscultation.  Heart: Regular rate S1-S2. He has a soft 2/6 systolic murmur radiating to the upper right sternal border.     Abdomen: His abdominal exam reveals good bowel sounds.  Extremities:  His no clubbing cyanosis.   He has no leg edema today.   Neuro:  Neuro exam is nonfocal. He is hard of hearing.    ECG:  Jun 12, 2013:  Sinus brady at 103. LBBB.   Assessment / Plan:

## 2013-08-29 NOTE — Assessment & Plan Note (Addendum)
He has chronic heart failure but he has been stable. He did have a [facility] several weeks ago. I suspect that the Lasix therapy has contributed to his gout, buut I do not think that we can change these medications. I've advised him to consult his medical doctor for further management of his gout. He may benefit from allopurinol or colchicine if his renal function allows.   Overall he is doing very well for someone 80 yo . Will see him in 6 months.

## 2014-01-09 LAB — LIPID PANEL
Cholesterol: 158 mg/dL (ref 0–200)
HDL: 39 mg/dL (ref 35–70)
LDL CALC: 99 mg/dL
Triglycerides: 102 mg/dL (ref 40–160)

## 2014-02-07 ENCOUNTER — Ambulatory Visit (INDEPENDENT_AMBULATORY_CARE_PROVIDER_SITE_OTHER): Payer: Medicare Other | Admitting: Cardiovascular Disease

## 2014-02-07 ENCOUNTER — Encounter: Payer: Self-pay | Admitting: Cardiovascular Disease

## 2014-02-07 VITALS — BP 122/78 | HR 62 | Ht 66.0 in | Wt 165.2 lb

## 2014-02-07 DIAGNOSIS — R000 Tachycardia, unspecified: Secondary | ICD-10-CM

## 2014-02-07 DIAGNOSIS — I480 Paroxysmal atrial fibrillation: Secondary | ICD-10-CM

## 2014-02-07 DIAGNOSIS — I5022 Chronic systolic (congestive) heart failure: Secondary | ICD-10-CM

## 2014-02-07 DIAGNOSIS — I4891 Unspecified atrial fibrillation: Secondary | ICD-10-CM | POA: Insufficient documentation

## 2014-02-07 MED ORDER — AMIODARONE HCL 400 MG PO TABS: ORAL_TABLET | ORAL | Status: DC

## 2014-02-07 NOTE — Assessment & Plan Note (Signed)
[name] has a history of paroxysmal atrial fibrillation. He takes amiodarone 200 mg each week. This seems to be holding him in normal sinus rhythm. I've refilled his prescription for amiodarone. I've written him for 400 mg tablets and he takes one half a tablet each week.  i will see him in 6 months.

## 2014-02-07 NOTE — Patient Instructions (Signed)
Your physician has recommended you make the following change in your medication:  Take Amiodarone 200 mg (1/2 of a 400 mg tablet once weekly)    Your physician wants you to follow-up in: 6 months with Dr. Elease HashimotoNahser. You will receive a reminder letter in the mail two months in advance. If you don't receive a letter, please call our office to schedule the follow-up appointment.  Your physician recommends that you return for lab work in:  BMP, CBC and TSH at your 6 month visit

## 2014-02-07 NOTE — Progress Notes (Signed)
Daniel HeftyLonnie E Bullock Date of Birth  December 04, 1912 [facility] [address]    [address] [place], [place]  [id number] [phone]  Fax  [phone]   Problems: 1. Coronary artery disease-status post CABG 1995, status post coronary stenting in 2004. 2. Abdominal aortic aneurysm repair 3. History of carotid endarterectomy 4. CHF- EF 35-40% 5. Mitral regurgitation 6.  Aortic insufficiency 7. Sinus bradycardia 8. LBBB 9. Atrial fibrillation   History of Present Illness:  [name] is a 81 year old gentleman with a history of CAD - CABG ( 1995) and coronary stents ( 2004), AAA repair,  peripheral vascular disease. He was seen in our office 6 months ago. He had carotid endarterectomy last year.  He's been having some problems with shortness of breath he recently. The shortness of breath is especially bad at night.  He also has noted some swelling in his ankles.  He had bronchitis about 6 weeks ago and has not recovered completely since that time.   He's been using an inhaler twice a day for the past several weeks.  He eats out at restaurants approximately one meal each day. He may be getting a little bit of extra salt. He does not add salt to his food.  He occasionally has some episodes of very mild chest tightness. It is not associated with any specific activities such as exercise, eating, drinking. He has not tried any nitroglycerin for that tightness yet.  His echocardiogram performed following his last visit revealed an ejection fraction between 35 and 40%. He has mild to moderate Daniel, mild to moderate TR, and mild to moderate aortic insufficiency.  September 30, 2011 We started him on Lasix 20 mg a day last year and he feels much better.  He notes a bit of swelling particularly in his left foot.  March 10, 2012: [name] presents today for evaluation of some palpitations and some lightheadedness. He's been having dizziness for the past several weeks. He still able to do all  of his normal activities.  Jun 07, 2012: [name] continues to do well.  He put in a garden this year ( tomatoes, peppers, cucs, onions) .   He has stopped his eye drops since we last saw him for a HR of 48.  His HR has now improved to 72.  August 30, 2012  [name] was [facility].  He also had atrial fibrillation and was DCd on amiodarone.   The cardura and furosemide were stopped.  He was placed on Torsemide instead of Lasix.     He is feeling better at this point.    Nov. 5, 2014:  He was seen by [facility] about 1 month ago.  He had in the hospital at [facility] for CHF.   He developed cardiorenal syndrome.    His ACE-inhibitor was stopped.   He is feeling much better.  He was out mowing with the riding mower this week .  Creatinine had improved slightly  - still not back to baseline.  He tries to avoid salt.   Feb. 4, 2015:  [name] is doing okay. He continues to eat some extra salt. On his own, he increase his Demadex to 40 mg in the morning.  He still eats sausage in the mornings. Also put salt on his eggs. . That long discussion about getting salt substitute.  His eye doctor has also started him on timolol eyedrops.  Jun 12, 2013:  [name] is about  the same.  He has continued to have issues with volume overload.   He's cut out all of his extra salt and is feeling quite a bit better.   His eye doctor put him back on Timolol eye drops and as a result, his HR is quite a bit lower today.    08/29/2013:  He had a bad episode of gout  While at [facility] recently.    He was give motrin and a narcotic but no colchicine.   He has been losing weight slowly.    He is feeling well    Jan. 6, 2016:    [name] is a 81 yo who is seen for his CHF and leg edema.  Just recently had his 89th birthday. Doing well., no cp. Not much leg edema     Current Outpatient Prescriptions on File Prior to Visit  Medication Sig Dispense Refill  . ALPRAZolam (XANAX) 1 MG tablet  Take 1 mg by mouth daily.    Marland Kitchen amiodarone (PACERONE) 200 MG tablet Take one 200 mg tablet once a week 30 tablet 3  . aspirin 81 MG EC tablet Take 81 mg by mouth daily.      . clopidogrel (PLAVIX) 75 MG tablet Take 75 mg by mouth daily.      . finasteride (PROSCAR) 5 MG tablet Take 5 mg by mouth daily.      . Multiple Vitamin (MULTIVITAMIN) tablet Take 1 tablet by mouth daily.    . potassium chloride 20 MEQ TBCR Take 10 mEq by mouth daily. 60 tablet 3  . pravastatin (PRAVACHOL) 40 MG tablet Take 40 mg by mouth daily.     . timolol (TIMOPTIC) 0.5 % ophthalmic solution 1 drop daily.    Marland Kitchen torsemide (DEMADEX) 20 MG tablet Take 20 mg by mouth daily.      No current facility-administered medications on file prior to visit.    No Known Allergies  Past Medical History  Diagnosis Date  . AAA (abdominal aortic aneurysm) 2005    Open AAA repair  . S/P CABG (coronary artery bypass graft) 2003    At [facility]; PCI 2004; no LHC since 2004  . Echocardiogram abnormal 11/10    Mild LVH, EF 55%, mild AI, diastolic dysfunction  . Hypertension     Unspecified  . Hyperlipidemia     Mixed  . History of orthostatic hypotension   . Carotid artery disease     Right basal ganglia CVA 11/10; CTA neck showed 90% RICA stenosis, 75-80% LICA stenosis  . Coronary artery disease     a. s/p CABG 1995 b. PCI in 2004  . Hx of bronchitis   . Pneumonia     HISTORY  . Gout     Past Surgical History  Procedure Laterality Date  . Coronary artery bypass graft  2003  . Abdominal aortic aneurysm repair  2005    History  Smoking status  . Never Smoker   Smokeless tobacco  . Never Used    History  Alcohol Use No    Family History  Problem Relation Age of Onset  . Diabetes Other   . Coronary artery disease Other   . Heart disease Other     Reviw of Systems:  Reviewed in the HPI.  All other systems are negative.  Physical Exam: BP 122/78 mmHg  Pulse 62  Ht  (1.676 m)  Wt 165 lb 4 oz (74.957 kg)   BMI 26.68 kg/m2 The patient is alert  and oriented x 3.  The mood and affect are normal.   Skin: warm and dry.  Color is normal.    HEENT:   Normocephalic/atraumatic. His carotids are normal.  He has a well healed CEA scar on his right neck.   Lungs: Clear to auscultation.  Heart: Regular rate S1-S2. He has a soft 2/6 systolic murmur radiating to the upper right sternal border.     Abdomen: His abdominal exam reveals good bowel sounds.  Extremities:  His no clubbing cyanosis.   He has no leg edema today.   Neuro:  Neuro exam is nonfocal. He is hard of hearing.    ECG:  Jan. 6, 2016:  Sinus rhythm at 62, LBBB  Assessment / Plan:

## 2014-02-07 NOTE — Assessment & Plan Note (Signed)
He is doing great since avoiding salt. He will continue same meds. I'll see in 6 months. Will check CBC, BMP, TSH at that time.

## 2014-05-18 ENCOUNTER — Inpatient Hospital Stay
Admit: 2014-05-18 | Disposition: A | Discharge status: Home / Self Care | Payer: Self-pay | Attending: Internal Medicine | Admitting: Internal Medicine

## 2014-05-18 LAB — PROTIME-INR
INR: 1
Prothrombin Time: 13.6 secs

## 2014-05-18 LAB — CBC
HCT: 40.9 % (ref 40.0–52.0)
HGB: 13.6 g/dL (ref 13.0–18.0)
MCH: 31.6 pg (ref 26.0–34.0)
MCHC: 33.3 g/dL (ref 32.0–36.0)
MCV: 95 fL (ref 80–100)
Platelet: 121 10*3/uL — ABNORMAL LOW (ref 150–440)
RBC: 4.3 10*6/uL — ABNORMAL LOW (ref 4.40–5.90)
RDW: 13.4 % (ref 11.5–14.5)
WBC: 6.6 10*3/uL (ref 3.8–10.6)

## 2014-05-18 LAB — BASIC METABOLIC PANEL
Anion Gap: 8 (ref 7–16)
BUN: 34 mg/dL — AB
Calcium, Total: 8.9 mg/dL
Chloride: 97 mmol/L — ABNORMAL LOW
Co2: 30 mmol/L
Creatinine: 1.6 mg/dL — ABNORMAL HIGH
EGFR (African American): 41 — ABNORMAL LOW
GFR CALC NON AF AMER: 35 — AB
GLUCOSE: 105 mg/dL — AB
POTASSIUM: 4.2 mmol/L
SODIUM: 135 mmol/L

## 2014-05-18 LAB — TROPONIN I: Troponin-I: <0.03 ng/mL

## 2014-05-19 LAB — BASIC METABOLIC PANEL
ANION GAP: 10 (ref 7–16)
BUN: 34 mg/dL — ABNORMAL HIGH
CALCIUM: 8.7 mg/dL — AB
CHLORIDE: 100 mmol/L — AB
CO2: 26 mmol/L
CREATININE: 1.49 mg/dL — AB
EGFR (Non-African Amer.): 38 — ABNORMAL LOW
GFR CALC AF AMER: 44 — AB
Glucose: 168 mg/dL — ABNORMAL HIGH
POTASSIUM: 4 mmol/L
Sodium: 136 mmol/L

## 2014-05-19 LAB — CBC WITH DIFFERENTIAL/PLATELET
Basophil #: 0 10*3/uL (ref 0.0–0.1)
Basophil %: 0.1 %
Eosinophil #: 0.1 10*3/uL (ref 0.0–0.7)
Eosinophil %: 0.6 %
HCT: 38.3 % — AB (ref 40.0–52.0)
HGB: 12.9 g/dL — ABNORMAL LOW (ref 13.0–18.0)
LYMPHS ABS: 2.1 10*3/uL (ref 1.0–3.6)
Lymphocyte %: 23.8 %
MCH: 32.1 pg (ref 26.0–34.0)
MCHC: 33.8 g/dL (ref 32.0–36.0)
MCV: 95 fL (ref 80–100)
MONOS PCT: 1.9 %
Monocyte #: 0.2 x10 3/mm (ref 0.2–1.0)
NEUTROS ABS: 6.6 10*3/uL — AB (ref 1.4–6.5)
Neutrophil %: 73.6 %
Platelet: 117 10*3/uL — ABNORMAL LOW (ref 150–440)
RBC: 4.03 10*6/uL — ABNORMAL LOW (ref 4.40–5.90)
RDW: 13.5 % (ref 11.5–14.5)
WBC: 9 10*3/uL (ref 3.8–10.6)

## 2014-05-19 LAB — MAGNESIUM: MAGNESIUM: 1.9 mg/dL

## 2014-05-20 LAB — CBC WITH DIFFERENTIAL/PLATELET
BASOS ABS: 0 10*3/uL (ref 0.0–0.1)
BASOS PCT: 0 %
EOS ABS: 0.1 10*3/uL (ref 0.0–0.7)
Eosinophil %: 0.4 %
HCT: 34.8 % — AB (ref 40.0–52.0)
HGB: 11.6 g/dL — AB (ref 13.0–18.0)
Lymphocyte #: 2.4 10*3/uL (ref 1.0–3.6)
Lymphocyte %: 18.3 %
MCH: 31.3 pg (ref 26.0–34.0)
MCHC: 33.4 g/dL (ref 32.0–36.0)
MCV: 94 fL (ref 80–100)
MONOS PCT: 6.5 %
Monocyte #: 0.9 x10 3/mm (ref 0.2–1.0)
NEUTROS ABS: 9.8 10*3/uL — AB (ref 1.4–6.5)
Neutrophil %: 74.8 %
PLATELETS: 114 10*3/uL — AB (ref 150–440)
RBC: 3.71 10*6/uL — AB (ref 4.40–5.90)
RDW: 13.2 % (ref 11.5–14.5)
WBC: 13.2 10*3/uL — AB (ref 3.8–10.6)

## 2014-05-20 LAB — BASIC METABOLIC PANEL
Anion Gap: 5 — ABNORMAL LOW (ref 7–16)
BUN: 39 mg/dL — AB
CREATININE: 1.56 mg/dL — AB
Calcium, Total: 8.4 mg/dL — ABNORMAL LOW
Chloride: 101 mmol/L
Co2: 27 mmol/L
EGFR (African American): 42 — ABNORMAL LOW
EGFR (Non-African Amer.): 36 — ABNORMAL LOW
GLUCOSE: 125 mg/dL — AB
Potassium: 4.2 mmol/L
SODIUM: 133 mmol/L — AB

## 2014-05-21 ENCOUNTER — Telehealth: Payer: Self-pay

## 2014-05-21 NOTE — Telephone Encounter (Signed)
Pt son, Peyton NajjarLarry, called, states that pt was given and antiobiotic,  when he was in the hospital over the weekend,  and pharmacist will not fill for pt, states it interferes with his heart medication.

## 2014-05-22 NOTE — Telephone Encounter (Signed)
Spoke w/ pt. He reports that "it got straightened out".  Reports that [facility] would not fill his meds, but his son found a pharmacy that would.  Pt does not know the name of the med that was sent in.  Asked him to call back w/ any questions or concerns.

## 2014-05-25 NOTE — H&P (Signed)
PATIENT NAME:  [name], [name] MR#:  [id number] DATE OF BIRTH:  January 28, 1913  DATE OF ADMISSION:  10/16/2012  PRIMARY CARE PHYSICIAN: Dr. Lacie Scotts.   CHIEF COMPLAINT: Shortness of breath and cough with chills.   HISTORY OF PRESENT ILLNESS: A 81 year old Caucasian male patient with history of coronary artery disease, systolic CHF, coronary artery disease presents to the Emergency Room complaining of not feeling well, having chills and shortness of breath with cough. The patient has had a dry cough. Since early today, he has not been feeling well. Complains of some mild midsternal pain, shortness of breath, dry cough and chills. The patient did not have any fever. He does not have any PND, orthopnea or edema. His midsternal pain is nonradiating. He mentions that he has had multiple heart problems in the past along with heart attacks, and his chest pain does not feel like his heart attacks in the past. His chest pain gets worse with coughing and deep breathing.   He was at [facility] 2 months prior with bronchitis and also some pulmonary edema and was on IV Lasix as per the daughter. He had significant edema which has resolved at this time. She also mentioned that he had elevated creatinine at that time, although she is not sure what the numbers were. Today's creatinine is 2.2, with the last available creatinine in the records from 2010 of 1.   REVIEW OF SYSTEMS:   CONSTITUTIONAL: Complains of fatigue, weakness.  EYES: No blurred vision, pain, redness.  ENT: No tinnitus, ear pain. Does have some hearing loss.  RESPIRATORY: Has dry cough, some wheezing, shortness of breath.  CARDIOVASCULAR: Has chest pain. No edema.  GASTROINTESTINAL: No nausea, vomiting, diarrhea, abdominal pain.  GENITOURINARY: No dysuria, hematuria, frequency.  ENDOCRINE: No polyuria, nocturia, thyroid problems.  HEMATOLOGIC AND LYMPHATIC: No anemia, easy bruising, bleeding.  INTEGUMENTARY: No acne, rash, lesions.   MUSCULOSKELETAL: Has some arthritis.  NEUROLOGIC: No focal numbness, weakness, seizures.  PSYCHIATRIC: Has some anxiety.   PAST MEDICAL HISTORY:  1. Coronary artery disease, status post multiple stents and bypass.  2. Hypertension.  3. Hyperlipidemia.  4. Benign prostatic hypertrophy.  5. Abdominal aortic aneurysm repair in 2005.  6. Chronic systolic CHF with EF at 20% as per daughter.   PAST SURGICAL HISTORY:  1. Ventral hernia repair.  2. Abdominal aortic aneurysm repair.  3. Bypass.   ALLERGIES TO MEDICATIONS: None.   SOCIAL HISTORY: The patient lives alone. His family lives close by. He does not smoke. No alcohol. No illicit drugs. Walks on his own without assistance.   FAMILY HISTORY: Positive for diabetes. One of his sons also has heart problems.   HOME MEDICATION LIST: Not available at this time, but he was started on amiodarone recently at [facility] and is also on Lasix. He does take a low-dose blood pressure pill as per daughter. On reviewing old records, the patient is on Plavix, fish oil, multivitamin, pravastatin, doxazosin, iron, finasteride, lisinopril and aspirin.   PHYSICAL EXAMINATION:  VITAL SIGNS: Temperature 99.2, pulse of 85, respirations 28, blood pressure 83/57, saturating 92% on room air.  GENERAL: Elderly, frail, Caucasian male patient sitting up in bed in respiratory distress.  PSYCHIATRIC: Alert and oriented x3. Mood and affect appropriate. Judgment intact.  HEENT: Atraumatic, normocephalic. Oral mucosa moist and pink. No oral ulcers or thrush. External ears and nose normal.  NECK: Supple. No thyromegaly. No palpable lymph nodes. Trachea midline. No carotid bruit or JVD.  CARDIOVASCULAR: S1 and S2. Systolic murmur.  Peripheral pulses 2+. No edema.  RESPIRATORY: Bilateral expiratory wheezing with coarse breath sounds.  GASTROINTESTINAL: Soft abdomen, nontender. Bowel sounds present. No hepatosplenomegaly palpable. Scar from prior surgery.  GENITOURINARY: No CVA  tenderness or bladder distention.  SKIN: Warm and dry. No petechiae, rash, ulcers.  MUSCULOSKELETAL: No joint swelling, redness, effusion of the large joints. Normal muscle tone.  NEUROLOGICAL: Motor strength 5/5 in upper and lower extremities.  LYMPHATIC: No cervical lymphadenopathy.   LABORATORY STUDIES: Show glucose 150, BUN 43, creatinine 2.2, sodium 132, potassium 4.6, chloride 101. GFR 24. AST, ALT, alkaline phosphatase, bilirubin normal. Troponin less than 0.02.   WBC 14.3, hemoglobin 11.6, platelets of 131.   Urinalysis shows no bacteria, 1 WBC.   EKG shows left bundle branch block which is chronic.   Chest x-ray shows chronic changes along with right lower lobe infiltrate. No pleural effusions.   ASSESSMENT AND PLAN:  1. Sepsis with hypotension and elevated white count in a patient with right lower lobe pneumonia. The patient is critically ill with hypotension. He was recently at [facility] approximately 2 months back. Will start him on antibiotics for healthcare-acquired pneumonia with vancomycin, Zosyn and azithromycin. Get blood and sputum cultures. The patient also is wheezing and will start him on IV Solu-Medrol along with scheduled nebulizer treatment. Will also put him on gentle intravenous hydration considering his hypotension. With his low ejection fraction, have to monitor for any fluid overload. He does have coarse breath sounds on his lung exam but does not have any pulmonary edema on the chest x-ray.  2. Acute renal failure versus chronic kidney disease. The patient's creatinine is elevated at 2.2. Last known creatinine is 1. Daughter mentions that his creatinine was elevated at [facility], although the numbers are unknown. I suspect he has acute tubular necrosis secondary to his sepsis and pneumonia. Will start him on intravenous fluids and repeat creatinine in the morning.  3. Hypotension. He mentioned that he runs low normal most of the time, but he also mentions that  he is on blood pressure medications. These will be held at this time.  4. Chronic systolic congestive heart failure. The patient does not have any edema or jugular venous distention. No pulmonary edema. Will watch out for any fluid overload as he will be on some intravenous fluids. The patient was recently started on amiodarone at [facility] as per the daughter. The dose is unknown at this time. No history of atrial fibrillation. I will put him on 200 mg at this time. Once we have his home medication list, please do change his amiodarone dosing.  5. Deep vein thrombosis prophylaxis with heparin.   CODE STATUS: LIMITED CODE WITH DO NOT INTUBATE but okay for resuscitation.   TIME SPENT TODAY ON THIS CASE: critical care time 55 minutes.   ____________________________ Molinda BailiffSrikar R. Robbin Loughmiller, MD srs:gb D: 10/16/2012 20:01:40 ET T: 10/16/2012 20:31:21 ET JOB#: [id number]  cc: Wardell HeathSrikar R. Yanni Ruberg, MD, <Dictator> Meindert A. Lacie ScottsNiemeyer, MD Kristeen MissPhilip Nahser, MD Orie FishermanSRIKAR R Alcus Bradly MD ELECTRONICALLY SIGNED 10/16/2012 21:04

## 2014-05-25 NOTE — Discharge Summary (Signed)
PATIENT NAME:  [name], Winslow E MR#:  [id number] DATE OF BIRTH:  May 11, 1912  DATE OF ADMISSION:  10/16/2012 DATE OF DISCHARGE:  10/19/2012  REASON FOR ADMISSION: Shortness of breath, cough, and chills.   DISCHARGE DIAGNOSES: 1. Sepsis secondary to healthcare acquired pneumonia.  2. Healthcare acquired pneumonia.  3. Acute on chronic systolic congestive heart failure.  4. Ischemic cardiomyopathy with ejection fraction of 20%.  5. Acute renal failure.  6. Hyponatremia.  7. Coronary artery disease.  8. Mild weakness.  9. Previous cerebrovascular accident.  10. Aortic abdominal aneurysm.   DISPOSITION: Home with home health.   MEDICATIONS AT DISCHARGE: Plavix 75 mg daily, pravastatin 40 mg daily, vitamin D 1000 mg daily, multivitamin once daily, alprazolam 0.25 mg take 2 tablets once a day at bedtime, amiodarone 200 mg at bedtime, finasteride 5 mg at bedtime, Klor-Con 20 mEq once a day, lisinopril 5 mg take 2.5 mg once a day, aspirin 81 mg once a day, prednisone taper start at 30 mg once a day, decrease 10 mg a day until gone. Torsemide 20 mg once a day, take an extra tablet as needed, Protonix 40 mg once a day, Augmentin XR 1000 mg/62.5 mg 2 tablets 2 times a day for 7 days.   FOLLOWUP:  1.  Follow-up with Dr. Lacie ScottsNiemeyer in the next 1 to 2 weeks.  2.  Follow-up with Dr. Melburn PopperNasher in the next 7 to 14 days.   IMPORTANT RESULTS: Creatinine of 2.21 on admission, 2.0 at discharge. The patient has chronic kidney disease.   Glucose 126, sodium 128, LFTs within normal limits. Troponins were negative. Red blood cells on admission 14,000, at discharge 14.5; the patient was taking steroids, hemoglobin 10.9 at discharge and platelet count of 105; they have been decreases as well in the past.   Urinalysis: No signs of urinary tract infection. White blood cells 1. Red blood cells 3. Negative nitrites, negative leukocyte esterase.   EKG: Left bundle branch block, normal sinus rhythm, otherwise.   Echo  Doppler:  1. Left ventricular ejection fraction of less than 20%, severely decreased left ventricular function with global hypokinesis.  2. Mild to moderate increase in the ventricular internal cavity size.  3. Mildly depressed right ventricular function.  4. Moderate mitral valve regurgitation.  5. Mild aortic valve stenosis.  6. Aortic valve   stenosis may be underestimated secondary to poor LV function and inaccurate measurements.  7. Mild to moderate pulmonic valve regurgitation.  8. Mild to moderate tricuspid regurgitation.  9. Moderate elevation of pulmonary artery systolic pressures.   Chest x-ray at discharge: Cardiomegaly with pulmonary edema, COPD and CABG changes seen at admission. Stable cardiomegaly. A right hilar prominence could represent underlying pneumonia.   HOSPITAL COURSE: This is a very nice 81 year old gentleman who is actually in good shape for his age. He has coronary artery disease, systolic CHF, presents to the Emergency Department not feeling very well, having chills, shortness of breath and cough. His cough was dry, but he was having significant malaise, mild midsternal pain with shortness of breath and dry cough that was getting worse and worse as the days went by. The patient was recently admitted in [facility] 2 months prior with bronchitis and pulmonary edema for which he was given IV Lasix, and he had significant edema that resolved. Apparently his creatinine has been trending up; previous creatinine was around 1 now is around 2, and this seems to be baseline.   The patient was admitted with a diagnosis of sepsis  secondary to pneumonia. He was put on antibiotics Zosyn and azithromycin and vancomycin and treated with IV Solu-Medrol. As per problems go:  1. Sepsis. The patient was poly cultured. Blood cultures were negative. No growth over 48 hours. The patient was treated with triple antibiotic therapy due to the previous admission, considerations to cover healthcare  acquired pneumonia. The patient starting improving quickly. His chest x-ray, repeated, is starting to show more pulmonary edema. The patient received some fluids on admission and then needed to be diuresed. But overall, the main problem was pneumonia. He had sepsis present at admission with no fevers and blood cultures were negative. He is tachycardic. He had elevation of white blood cells. The patient is discharged on Augmentin XR.  2. Acute on chronic systolic CHF. The patient had an ejection fraction of 20%. This was very well known by Dr. Elease Hashimoto and there is no plan of AICD due to the age. The patient takes torsemide at home and he is being advised to take an extra dose if there is any significant weight gain or edema or shortness of breath. The patient also could have severe aortic valve stenosis, although this last echo that we did on this hospitalization was not very good to check on that because of the depression of the ejection fraction. The patient could have decrease of his renal function secondary to this problem.  3. Acute renal failure. Previous creatinine was 1 several years ago, but now with recent records from [facility], creatinine has been stable around  2. torsemide. 4. Hyponatremia. The patient was taking Lasix now is taking torsemide. We will continue to monitor. The hyponatremia is most likely due to fluid overload.  5. Coronary artery disease. Continue treatment with Plavix and a beta blocker.  6. Other medical problems were stable during this hospitalization. We recommend that Dr. Lacie Scotts to follow up on his kidney function as long as the patient is taking torsemide and diuretics.   The patient actually wanted to go home. He looked good the day of discharge, but there are still certain issues. His kidney function might be back to his baseline but his sodium was 128. Recommend follow-up with Dr. Lacie Scotts in the next 7 to 10 days, Dr. Elease Hashimoto in the next 7 to 10 days.   Case was discussed  with cardiology of [facility] who were able to pull records from Dr. Harvie Bridge office showing that the ejection fraction of 20% has been going on for a while and that there was no plan for an AICD, only medical therapy. The patient is to be maximized on his medical therapy. He is already taking an ACE inhibitor. For his atrial fibrillation, he is taking amiodarone for which he is not on a beta blocker actually.   TIME SPENT: I spent about 45 minutes with this discharge.  ____________________________ Felipa Furnace, MD rsg:sg D: 10/20/2012 06:57:00 ET T: 10/20/2012 07:50:27 ET JOB#: [id number]  cc: Meindert A. Lacie Scotts, MD Kristeen Miss, MD Felipa Furnace, MD, <Dictator>    Carmel Waddington Juanda Chance MD ELECTRONICALLY SIGNED 10/27/2012 10:52

## 2014-06-03 NOTE — H&P (Signed)
PATIENT NAME:  [name], [name] MR#:  [id number] DATE OF BIRTH:  07-14-1912  DATE OF ADMISSION:  05/18/2014  PRIMARY CARE PHYSICIAN: Kristeen Miss, MD  REFERRING PHYSICIAN: Kathreen Devoid. Paduchowski, MD  CHIEF COMPLAINT: Hemoptysis today.   HISTORY OF PRESENT ILLNESS: A 81 year old Caucasian male with a history of CAD, cardiomyopathy, and hypertension, presented to the ED with the above chief complaint. The patient is alert, awake, oriented, in no acute distress. The patient started to have a cough today. In addition, the patient noticed to have some bloody sputum. The patient denies any fever or chills. No shortness of breath or wheezing. No chest pain. No palpitations. The patient's chest x-ray did not show any acute pulmonary process but a CAT scan of the chest showed alveolar hemorrhage. Dr. Lenard Lance discussed with Dr. Dema Severin and admitted for right-sided pneumonia.   PAST MEDICAL HISTORY: 1.  CAD status post multiple stents and bypass. 2.  Hypertension.  3.  Hyperlipidemia.  4.  BPH. 5.  Chronic systolic CHF with ejection fraction 20%.  6.  AAA.  7.  CKD.   PAST SURGICAL HISTORY: Ventral hernia repair, AAA repair, bypass.   SOCIAL HISTORY: No smoking or drinking or illicit drugs.   FAMILY HISTORY: Diabetes.   ALLERGIES: None.   HOME MEDICATIONS: 1.  Torsemide 20 mg p.o. b.i.d. 2. Timolol ophthalmic solution 0.05% 1 drop to each effected eye once a day at bedtime.  3.  Pravastatin 40 mg p.o. at bedtime;  4.  Multivitamin 1 tablet p.o. daily.  5.  Klor-Con 20 mEq 1/2 tablet once a day.  6.  Finasteride 5 mg p.o. daily at bedtime.  7.  Plavix 75 mg p.o. daily.  8.  Aspirin 81 mg p.o. daily.  9.  Amiodarone 400 mg 1/2 tablet once a week on Monday.  10.  Alprazolam 1 mg p.o. at bedtime.  REVIEW OF SYSTEMS:  CONSTITUTIONAL: The patient denies any fever or chills. No headache or dizziness or weakness.  EYES: No double vision or blurred vision.  ENT: No postnasal drip, slurred  speech, or dysphagia. CARDIOVASCULAR: No chest pain, palpitation, orthopnea, or nocturnal dyspnea. No leg edema.  PULMONARY: Positive for cough, sputum, and hemoptysis. No shortness of breath or wheezing.  GASTROINTESTINAL: No abdominal pain, nausea, vomiting, diarrhea. No melena or bloody stool.  GENITOURINARY: No dysuria, hematuria, or incontinence.  SKIN: No rash or jaundice.  NEUROLOGY: No syncope, loss of consciousness, or seizure.  ENDOCRINOLOGY: No polyuria, polydipsia, heat or cold intolerance.  HEMATOLOGY: The patient has easy bleeding and bruises.   PHYSICAL EXAMINATION: VITAL SIGNS: Temperature 98.7, blood pressure 151/98, pulse 76, oxygen saturation 95% on room air.  GENERAL: The patient is alert, awake, oriented, in no acute distress.  HEENT: Pupils round, equal, reactive to light and accommodation. Moist oral mucosa. Clear oropharynx.  NECK: Supple. No JVD or carotid bruit. No lymphadenopathy. No thyromegaly.  CARDIOVASCULAR: S1 and S2. Regular rate and rhythm. No murmurs or gallops.  PULMONARY: Bilateral air entry. No wheezing, but has rhonchi and some crackles on the right lower base. No use of accessory muscles to breathe.  ABDOMEN: Soft. No distention or tenderness. No organomegaly. Bowel sounds present.  EXTREMITIES: No edema, clubbing, or cyanosis. No calf tenderness. Bilateral pedal pulses present.  SKIN: No rash or jaundice.  NEUROLOGY: A and O x 3. No focal deficit. Power 5/5. Sensation intact.   LABORATORY DATA: Glucose 105, BUN 34, creatinine 1.6. Electrolytes are normal. WBC 6.6, hemoglobin 13.6, platelets 123,000. From the labs,  patient has chronic thrombocytopenia.  INR 1.0.   IMAGING: A CAT scan of the chest without contrast shows right lower lobe airspace disease consistent with alveolar hemorrhage.  EKG: Shows sinus rhythm with sinus arrhythmia, first-degree AV block, left bundle branch block.   IMPRESSIONS:  1.  Right lower lobe pneumonia with  hemoptysis. 2.  History of coronary artery disease, hypertension, cardiomyopathy with chronic systolic congestive heart failure, ejection fraction 20%.  3.  Chronic kidney disease stage 3.  4.  Thrombocytopenia.  5.  Hyperlipidemia.   PLAN OF TREATMENT: 1.  The patient will be admitted to medical floor. The patient was treated with Levaquin and IV Solu-Medrol with 1 dose in the ED. We will continue Levaquin. Follow up with pulmonary consult from Dr. Dema SeverinMungal.  2.  Follow up sputum culture.  3.  Hold aspirin and Plavix due to hemoptysis and follow up hemoglobin.  4.  For coronary artery disease, hold aspirin and Plavix but continue statin.   I discussed the patient's condition and plan of treatment with the patient and the patient's son.   CODE STATUS: Patient wants full code.   TIME SPENT: About 52 minutes.    ____________________________ Shaune PollackQing Alencia Gordon, MD qc:ST D: 05/18/2014 22:33:44 ET T: 05/19/2014 01:11:33 ET JOB#: [id number]  cc: Shaune PollackQing Chrisha Vogel, MD, <Dictator> Shaune PollackQING Havoc Sanluis MD ELECTRONICALLY SIGNED 05/19/2014 12:24

## 2014-06-03 NOTE — Discharge Summary (Signed)
PATIENT NAME:  [name], [name] MR#:  [id number] DATE OF BIRTH:  05/31/1912  DATE OF ADMISSION:  05/18/2014 DATE OF DISCHARGE:  05/20/2014  ADMITTING PHYSICIAN: Shaune Pollack, MD   DISCHARGING PHYSICIAN: Enid Baas, MD   PRIMARY CARE PHYSICIAN: Kristeen Miss, MD, cardiologist at [facility].   CONSULTATIONS IN THE HOSPITAL: None.   DISCHARGE DIAGNOSES: 1. Right lower lobe pulmonary hemorrhage from vigorous cough.  2. Bronchitis/pneumonitis,  3. Coronary artery disease, status post stents and bypass graft surgery.  4. Hypertension.  5. Hyperlipidemia.  6. Benign prostatic hypertrophy.  7. Chronic systolic congestive heart failure, ejection fraction of 20%.  8. Abdominal aortic aneurysm.  9. Chronic kidney disease.   DISCHARGE HOME MEDICATIONS:  1. Multivitamin 1 tablet p.o. daily.  2. Finasteride 5 mg p.o. daily.  3. Pravastatin 40 mg p.o. daily.  4. Xanax 1 mg p.o. at bedtime.  5. Amiodarone 200 mg once a week on Monday.  6. Klor-Con 20 mEq 1/2 tablet once a day at bedtime.  7. Timolol ophthalmic maleate 0.5% eyedrops 1 drop each eye once a day at bedtime.  8. Torsemide 20 mg twice a day as needed for edema.  9. Robitussin-DM 10 mL q. 6 hours p.r.n. for cough.  10. Levaquin 250 mg p.o. daily for 6 days.  11. Cepacol lozenges 1 lozenge every 2 hours as needed for cough and sore throat.   The patient advised to hold the following medications:  1. Plavix. Advised to hold indefinitely.  2. Aspirin. Hold for 1 week and can be restarted if no further bleeding.   DISCHARGE HOME OXYGEN: None.   DISCHARGE ACTIVITY: As tolerated.   DISCHARGE DIET: Low-sodium diet.    FOLLOWUP INSTRUCTIONS: 1. PCP follow-up in 2 weeks.  2. Aspirin can be restarted after 1 week.   LABORATORY DATA AND IMAGING STUDIES PRIOR TO DISCHARGE: WBC 13.2, hemoglobin 11.6, hematocrit 34.8, platelet count 114,000. Sodium 133, potassium 4.2, chloride 101, bicarbonate 27, BUN 39, creatinine 1.5, glucose 125,  calcium of 8.4.   Chest x-ray prior to discharge showing improved interstitial opacities at right lung base, potentially clearing hemorrhage, pneumonitis, or infection. Unchanged tortuous thoracic aorta noted. CT of the chest without contrast on admission showing a right lower lobe airspace disease consistent with alveolar hemorrhage given history. changes of bronchiectasis in the right lower lobe. No other explanation for hemoptysis is identified. Cholelithiasis is noted as well.   BRIEF HOSPITAL COURSE: Mr. Dieppa is a 81 year old, very active and independent gentleman with past medical history significant for coronary artery disease status post bypass surgery, systolic congestive heart failure with ejection fraction of 20%; chronic kidney disease, abdominal aortic aneurysm, hypertension, who lives at home by himself presents secondary to worsening cough and hemoptysis.  1. Hemoptysis, secondary to pulmonary alveolar hemorrhage. His symptoms started with bronchitis, with worsening cough. He probably might have ruptured a small vessel in the lung. His x-ray and CT confirmed right lower lobe pulmonary hemorrhage. The patient has not had further bleeding. Hemoglobin remained stable in the hospital after an initial draw. His cough and sore throat were treated appropriately. His repeat chest x-ray shows clearing of the hemorrhage. He also has bronchitis symptoms that could have triggered all of this, so he started on antibiotic for the same and will complete to finish off the course. His symptoms are much improved, and he is not on any oxygen. No clinical changes, so he is being discharged home in a stable condition. He will be on Levaquin.  2. All  of his other home medications are being continued without any changes.   DISCHARGE CONDITION: Stable.   DISCHARGE DISPOSITION: Home.   TIME SPENT ON DISCHARGE: 40 minutes.     ____________________________ Enid Baasadhika Nhyla Nappi, MD rk:mw D: 05/20/2014 11:42:00  ET T: 05/20/2014 13:16:00 ET JOB#: [id number]  cc: Enid Baasadhika Pharaoh Pio, MD, <Dictator> Kristeen MissPhilip Nahser, MD Enid BaasADHIKA Alys Dulak MD ELECTRONICALLY SIGNED 05/25/2014 15:14

## 2014-06-06 ENCOUNTER — Encounter: Payer: Self-pay | Admitting: Nurse Practitioner

## 2014-06-06 ENCOUNTER — Ambulatory Visit (INDEPENDENT_AMBULATORY_CARE_PROVIDER_SITE_OTHER): Payer: Medicare Other | Admitting: Nurse Practitioner

## 2014-06-06 ENCOUNTER — Other Ambulatory Visit: Payer: Self-pay | Admitting: Family Medicine

## 2014-06-06 VITALS — BP 140/80 | HR 61 | Ht 66.0 in | Wt 170.4 lb

## 2014-06-06 DIAGNOSIS — I4891 Unspecified atrial fibrillation: Secondary | ICD-10-CM | POA: Diagnosis not present

## 2014-06-06 DIAGNOSIS — I480 Paroxysmal atrial fibrillation: Secondary | ICD-10-CM | POA: Diagnosis not present

## 2014-06-06 DIAGNOSIS — R4781 Slurred speech: Secondary | ICD-10-CM

## 2014-06-06 NOTE — Patient Instructions (Signed)
We will be checking the following labs today - NONE   Medication Instructions:    Continue with your current medicines but  I am stopping the aspirin  I would continue the Plavix  I would continue the fluid pill   Testing/Procedures To Be Arranged:  N/A  Follow-Up:   We will see you back as planned in June with Dr. Robet LeuNaher    Other Special Instructions:   N/A  [facility] the [facility] office at [phone] if you have any questions, problems or concerns.

## 2014-06-06 NOTE — Progress Notes (Signed)
CARDIOLOGY OFFICE NOTE  Date:  06/06/2014    [name] Date of Birth: 1912-07-24 Medical Record #[id number]  PCP:  Brayton El, MD  Cardiologist:  Nahser  Chief Complaint  Patient presents with  . Work in visit for "something around the heart"    Work in visit - seen for Dr. Elease Hashimoto     History of Present Illness: [name] is a 81 y.o. male who presents today for a work in visit - sent here by PCP. He is seen for Dr. Elease Hashimoto. He has a history of CAD with CABG 1995, status post coronary stenting in 2004, AAA repair, prior CEA, CHF with EF 35 to 40%, MR, AI, sinus brady, LBBB and paroxysmal atrial fib.  He was last seen here in January of 2016 - felt to be stable. Has had some bouts of heart failure and gout but overall was felt to be holding his own.   Referred back here by PCP.  Comes in today. Here with his son [name]. Says his doctor "heard something around my heart" - told to come here - heard a "one second pause".   He was hospitalized last month with pneumonia/alveolar hemorrhage. Was taken off his aspirin. Then added back and had Plavix stopped. He was off for 3 days - son then felt like he had a stroke - he notes that he had a spell with garbled speech - resolved within a day. Son put him back on Plavix. Has had more gout - PCP wanting to stop his fluid pill. EF is 20 %. They are quite wary of stopping Demedex and worried about heart failure. He is no longer coughing up blood. To be referred to pulmonary and to have CT of the head. Little lightheaded at times - no syncope noted.    Past Medical History  Diagnosis Date  . AAA (abdominal aortic aneurysm) 2005    Open AAA repair  . S/P CABG (coronary artery bypass graft) 2003    At [facility]; PCI 2004; no LHC since 2004  . Echocardiogram abnormal 11/10    Mild LVH, EF 55%, mild AI, diastolic dysfunction  . Hypertension     Unspecified  . Hyperlipidemia     Mixed  . History of orthostatic hypotension   .  Carotid artery disease     Right basal ganglia CVA 11/10; CTA neck showed 90% RICA stenosis, 75-80% LICA stenosis  . Coronary artery disease     a. s/p CABG 1995 b. PCI in 2004  . Hx of bronchitis   . Pneumonia     HISTORY  . Gout     Past Surgical History  Procedure Laterality Date  . Coronary artery bypass graft  2003  . Abdominal aortic aneurysm repair  2005     Medications: Current Outpatient Prescriptions  Medication Sig Dispense Refill  . ALPRAZolam (XANAX) 1 MG tablet Take 1 mg by mouth daily.    Marland Kitchen amiodarone (PACERONE) 400 MG tablet 200 mg (1/2 tablet) once a week 14 tablet 4  . aspirin 81 MG EC tablet Take 81 mg by mouth daily.      . clopidogrel (PLAVIX) 75 MG tablet Take 75 mg by mouth daily.      . finasteride (PROSCAR) 5 MG tablet Take 5 mg by mouth daily.      . Multiple Vitamin (MULTIVITAMIN) tablet Take 1 tablet by mouth daily.    . potassium chloride 20 MEQ TBCR Take 10 mEq by mouth  daily. 60 tablet 3  . pravastatin (PRAVACHOL) 40 MG tablet Take 40 mg by mouth daily.     Marland Kitchen. RA COL-RITE 100 MG capsule Take 100 mg by mouth 2 (two) times daily.  0  . timolol (TIMOPTIC) 0.5 % ophthalmic solution 1 drop daily.    Marland Kitchen. torsemide (DEMADEX) 20 MG tablet Take 20 mg by mouth daily.      No current facility-administered medications for this visit.    Allergies: No Known Allergies  Social History: The patient  reports that he has never smoked. He has never used smokeless tobacco. He reports that he does not drink alcohol or use illicit drugs.   Family History: The patient's family history includes Coronary artery disease in his other; Diabetes in his other; Heart disease in his other.   Review of Systems: Please see the history of present illness.   Otherwise, the review of systems is positive for leg swelling, cough, DOE, constipation, joint swelling. Bleeding, and skipped heart beats.   All other systems are reviewed and negative.   Physical Exam: VS:  BP 140/80  mmHg  Pulse 61  Ht 5\' 6"  (1.676 m)  Wt 170 lb 6.4 oz (77.293 kg)  BMI 27.52 kg/m2  SpO2 97% .  BMI Body mass index is 27.52 kg/(m^2).  Wt Readings from Last 3 Encounters:  06/06/14 170 lb 6.4 oz (77.293 kg)  02/07/14 165 lb 4 oz (74.957 kg)  08/29/13 167 lb 8 oz (75.978 kg)    General: Pleasant. Well developed, well nourished and in no acute distress. He looks younger than his stated age HEENT: Normal. Neck: Supple, no JVD, carotid bruits, or masses noted.  Cardiac: Regular rate and rhythm. Occasional ectopic noted. Harsh outflow murmur.  No edema.  Respiratory:  Lungs are clear to auscultation bilaterally with normal work of breathing.  GI: Soft and nontender.  MS: No deformity or atrophy. Gait and ROM intact. Skin: Warm and dry. Color is normal.  Neuro:  Strength and sensation are intact and no gross focal deficits noted.  Psych: Alert, appropriate and with normal affect.   LABORATORY DATA:  EKG:  EKG is ordered today. This demonstrates NSR with PVC and LBBB.  Lab Results  Component Value Date   WBC 13.2* 05/20/2014   HGB 11.6* 05/20/2014   HCT 34.8* 05/20/2014   PLT 114* 05/20/2014   GLUCOSE 125* 05/20/2014   CHOL 181 09/28/2011   TRIG 84 09/28/2011   HDL 37* 09/28/2011   LDLCALC 127* 09/28/2011   ALT 18 10/16/2012   AST 32 10/16/2012   NA 133* 05/20/2014   K 4.2 05/20/2014   CL 101 05/20/2014   CREATININE 1.56* 05/20/2014   BUN 39* 05/20/2014   CO2 27 05/20/2014   TSH 0.561 03/10/2012   INR 1.0 05/18/2014    BNP (last 3 results) No results for input(s): BNP in the last 8760 hours.  ProBNP (last 3 results) No results for input(s): PROBNP in the last 8760 hours.   Other Studies Reviewed Today:  Echo Study Conclusions from 2014  - Left ventricle: The cavity size was normal. There was mild concentric hypertrophy. Systolic function was severely reduced. The estimated ejection fraction was in the range of 20% to 25%. Severe diffuse hypokinesis  with no identifiable regional variations. Features are consistent with a pseudonormal left ventricular filling pattern, with concomitant abnormal relaxation and increased filling pressure (grade 2 diastolic dysfunction). - Aortic valve: There was moderate stenosis. Mild regurgitation. Mean gradient: 13mm Hg (S). Valve  area: 0.96cm^2 (Vmax). Low gradient likely due to low cardiac output. - Mitral valve: Moderate regurgitation. - Left atrium: The atrium was moderately dilated. - Right ventricle: Systolic function was mildly reduced. - Right atrium: The atrium was mildly dilated. - Tricuspid valve: Moderate regurgitation. - Pulmonic valve: Moderate regurgitation. - Pulmonary arteries: Systolic pressure was moderately to severely increased. PA peak pressure: 66mm Hg (S).  Assessment/Plan: 1. CAD - remote MI/remote CABG/PCI - no active symptoms - would continue with medical management.   2. ?Stroke - son has put back on Plavix. I doubt CT will change our course of action but I would continue the Plavix for now.   3. Recent alveolar hemorrhage/pneumonia - noted to be improved from prior scan - no active symptoms. I have stopped his aspirin since we will continue Plavix for now. They are to see pulmonary. Explained that if he has recurrence, Plavix will need to be stopped.   4. Systolic HF - would leave on his current regimen  5. Valvular heart disease  6. Gout - would suggest Colchicine  7. Abnormal heart sound - I suspect this was his PVC with a compensatory pause - he is really not symptomatic. I would not change his current regimen otherwise for now. He is not passing out. PPM not indicated.   Current medicines are reviewed with the patient today.  The patient does not have concerns regarding medicines other than what has been noted above.  The following changes have been made:  See above.  Labs/ tests ordered today include:    Orders Placed This Encounter    Procedures  . EKG 12-Lead     Disposition:   FU with Dr. Elease HashimotoNahser as planned in June. Patient is agreeable to this plan and will call if any problems develop in the interim.   Signed: Rosalio MacadamiaLori C. Elin Seats, RN, ANP-C 06/06/2014 12:05 PM  [facility] [address] [place], [place]  [address] Phone: [phone] Fax: [phone]

## 2014-06-15 ENCOUNTER — Ambulatory Visit
Admission: RE | Admit: 2014-06-15 | Discharge: 2014-06-15 | Disposition: A | Discharge status: Home / Self Care | Payer: Medicare Other | Source: Ambulatory Visit | Attending: Family Medicine | Admitting: Family Medicine

## 2014-06-15 DIAGNOSIS — R4781 Slurred speech: Secondary | ICD-10-CM | POA: Insufficient documentation

## 2014-07-09 ENCOUNTER — Encounter: Payer: Self-pay | Admitting: Family Medicine

## 2014-07-09 ENCOUNTER — Ambulatory Visit (INDEPENDENT_AMBULATORY_CARE_PROVIDER_SITE_OTHER): Payer: Medicare Other | Admitting: Family Medicine

## 2014-07-09 VITALS — BP 120/60 | HR 71 | Resp 15 | Ht 66.0 in | Wt 169.0 lb

## 2014-07-09 DIAGNOSIS — R4789 Other speech disturbances: Secondary | ICD-10-CM

## 2014-07-09 DIAGNOSIS — Z9889 Other specified postprocedural states: Secondary | ICD-10-CM

## 2014-07-09 DIAGNOSIS — R0489 Hemorrhage from other sites in respiratory passages: Secondary | ICD-10-CM

## 2014-07-09 DIAGNOSIS — M1A2720 Drug-induced chronic gout, left ankle and foot, without tophus (tophi): Secondary | ICD-10-CM

## 2014-07-09 DIAGNOSIS — R479 Unspecified speech disturbances: Secondary | ICD-10-CM

## 2014-07-09 DIAGNOSIS — M1A9XX0 Chronic gout, unspecified, without tophus (tophi): Secondary | ICD-10-CM | POA: Insufficient documentation

## 2014-07-09 HISTORY — DX: Other specified postprocedural states: Z98.890

## 2014-07-09 NOTE — Progress Notes (Signed)
Name: [name]   MRN: [id number]    DOB: [birth year]   Date:07/09/2014       Progress Note  Subjective  Chief Complaint  Chief Complaint  Patient presents with  . Follow-up  Pt. Is here for follow up of MRI Brain without contrast which was ordered on May 3rd, 2016 to rule out a stroke after pt.'s son noticed an episode of ataxia and slurred speech. Patient feels well, no new neurological symptoms.   HPI   Pt. Is here for follow up of MRI Brain without contrast which was ordered on May 3rd, 2016 to rule out a stroke after pt.'s son noticed an episode of ataxia and slurred speech. Patient feels well, no new neurological symptoms (no numbness, dizziness, weakness, speech disturbance).    Past Medical History  Diagnosis Date  . AAA (abdominal aortic aneurysm) 2005    Open AAA repair  . S/P CABG (coronary artery bypass graft) 2003    At [facility]; PCI 2004; no LHC since 2004  . Echocardiogram abnormal 11/10    Mild LVH, EF 55%, mild AI, diastolic dysfunction  . Hypertension     Unspecified  . Hyperlipidemia     Mixed  . History of orthostatic hypotension   . Carotid artery disease     Right basal ganglia CVA 11/10; CTA neck showed 90% RICA stenosis, 75-80% LICA stenosis  . Coronary artery disease     a. s/p CABG 1995 b. PCI in 2004  . Hx of bronchitis   . Pneumonia     HISTORY  . Gout    Past Surgical History  Procedure Laterality Date  . Coronary artery bypass graft  2003  . Abdominal aortic aneurysm repair  2005   Family History  Problem Relation Age of Onset  . Diabetes Other   . Coronary artery disease Other   . Heart disease Other      History  Substance Use Topics  . Smoking status: Never Smoker   . Smokeless tobacco: Never Used  . Alcohol Use: No     Current outpatient prescriptions:  .  ALPRAZolam (XANAX) 1 MG tablet, Take 1 mg by mouth daily., Disp: , Rfl:  .  amiodarone (PACERONE) 400 MG tablet, 200 mg (1/2 tablet) once a week, Disp: 14 tablet, Rfl:  4 .  clopidogrel (PLAVIX) 75 MG tablet, Take 75 mg by mouth daily.  , Disp: , Rfl:  .  finasteride (PROSCAR) 5 MG tablet, Take 5 mg by mouth daily.  , Disp: , Rfl:  .  Multiple Vitamin (MULTIVITAMIN) tablet, Take 1 tablet by mouth daily., Disp: , Rfl:  .  potassium chloride 20 MEQ TBCR, Take 10 mEq by mouth daily., Disp: 60 tablet, Rfl: 3 .  pravastatin (PRAVACHOL) 40 MG tablet, Take 40 mg by mouth daily. , Disp: , Rfl:  .  timolol (TIMOPTIC) 0.5 % ophthalmic solution, 1 drop daily., Disp: , Rfl:  .  torsemide (DEMADEX) 20 MG tablet, Take 20 mg by mouth daily. , Disp: , Rfl:  .  aspirin 81 MG EC tablet, Take 81 mg by mouth daily.  , Disp: , Rfl:  .  RA COL-RITE 100 MG capsule, Take 100 mg by mouth 2 (two) times daily., Disp: , Rfl: 0  No Known Allergies  Review of Systems  Respiratory: Negative for cough, hemoptysis and shortness of breath.   Cardiovascular: Negative for chest pain, palpitations and orthopnea.  Musculoskeletal: Negative for myalgias and joint pain.  Neurological: Negative for dizziness,  tingling, sensory change, focal weakness, seizures and loss of consciousness.      Objective  Filed Vitals:   07/09/14 1148  BP: 120/60  Pulse: 71  Resp: 15  Height: 5\' 6"  (1.676 m)  Weight: 169 lb (76.658 kg)  SpO2: 96%     Physical Exam  Constitutional: He is well-developed, well-nourished, and in no distress.  HENT:  Head: Normocephalic and atraumatic.  Cardiovascular: Regular rhythm.  Bradycardia present.   Murmur heard.  Crescendo systolic murmur is present with a grade of 3/6  Pulmonary/Chest: Effort normal and breath sounds normal.  Neurological: He is alert.  Psychiatric: Affect normal.         Assessment & Plan  1. Pulmonary alveolar hemorrhage Pt. Is being followed by Pulmonology. No symptoms of coughing, wheezing, or hemoptysis.  2. History of AAA (abdominal aortic aneurysm) repair Pt. Has an appointment scheduled with Dr. Wyn Quakerew for follow up.  3.  Speech disturbance in adult No further episodes of speech disturbance since the last witnessed episode approximately 6 weeks ago. MRI of brain is age-appropriate normal and was reviewed with pt. It shows chronic lacunar infarcts, no acute abnormality. Pt. Was reassured. No further work up warranted at this time.  4. 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 Pt. Is on Torsemide for lower extremity edema by cardiology. We have discussed stopping Torsemide if pt. Has another attack. He will follow up.   Harce Volden Asad A. Faylene KurtzShah [facility] 07/09/2014 11:58 AM

## 2014-07-30 ENCOUNTER — Ambulatory Visit
Admission: RE | Admit: 2014-07-30 | Discharge: 2014-07-30 | Disposition: A | Discharge status: Home / Self Care | Payer: Medicare Other | Source: Ambulatory Visit | Attending: Family Medicine | Admitting: Family Medicine

## 2014-07-30 ENCOUNTER — Ambulatory Visit (INDEPENDENT_AMBULATORY_CARE_PROVIDER_SITE_OTHER): Payer: Medicare Other | Admitting: Family Medicine

## 2014-07-30 ENCOUNTER — Telehealth: Payer: Self-pay | Admitting: *Deleted

## 2014-07-30 ENCOUNTER — Encounter: Payer: Self-pay | Admitting: Family Medicine

## 2014-07-30 VITALS — BP 100/80 | HR 64 | Temp 98.3°F | Ht 65.0 in | Wt 164.9 lb

## 2014-07-30 DIAGNOSIS — R9431 Abnormal electrocardiogram [ECG] [EKG]: Secondary | ICD-10-CM | POA: Insufficient documentation

## 2014-07-30 DIAGNOSIS — R509 Fever, unspecified: Secondary | ICD-10-CM | POA: Diagnosis not present

## 2014-07-30 DIAGNOSIS — I252 Old myocardial infarction: Secondary | ICD-10-CM | POA: Insufficient documentation

## 2014-07-30 DIAGNOSIS — R0602 Shortness of breath: Secondary | ICD-10-CM | POA: Diagnosis not present

## 2014-07-30 DIAGNOSIS — I509 Heart failure, unspecified: Secondary | ICD-10-CM | POA: Diagnosis not present

## 2014-07-30 DIAGNOSIS — E785 Hyperlipidemia, unspecified: Secondary | ICD-10-CM | POA: Insufficient documentation

## 2014-07-30 DIAGNOSIS — Z951 Presence of aortocoronary bypass graft: Secondary | ICD-10-CM | POA: Diagnosis not present

## 2014-07-30 DIAGNOSIS — Z8673 Personal history of transient ischemic attack (TIA), and cerebral infarction without residual deficits: Secondary | ICD-10-CM | POA: Insufficient documentation

## 2014-07-30 DIAGNOSIS — I2510 Atherosclerotic heart disease of native coronary artery without angina pectoris: Secondary | ICD-10-CM | POA: Insufficient documentation

## 2014-07-30 DIAGNOSIS — R0989 Other specified symptoms and signs involving the circulatory and respiratory systems: Secondary | ICD-10-CM | POA: Diagnosis not present

## 2014-07-30 DIAGNOSIS — J471 Bronchiectasis with (acute) exacerbation: Secondary | ICD-10-CM

## 2014-07-30 DIAGNOSIS — M109 Gout, unspecified: Secondary | ICD-10-CM | POA: Insufficient documentation

## 2014-07-30 DIAGNOSIS — J479 Bronchiectasis, uncomplicated: Secondary | ICD-10-CM | POA: Insufficient documentation

## 2014-07-30 DIAGNOSIS — D649 Anemia, unspecified: Secondary | ICD-10-CM | POA: Insufficient documentation

## 2014-07-30 DIAGNOSIS — N289 Disorder of kidney and ureter, unspecified: Secondary | ICD-10-CM | POA: Insufficient documentation

## 2014-07-30 DIAGNOSIS — K5900 Constipation, unspecified: Secondary | ICD-10-CM | POA: Diagnosis not present

## 2014-07-30 DIAGNOSIS — R05 Cough: Secondary | ICD-10-CM | POA: Diagnosis present

## 2014-07-30 MED ORDER — ALBUTEROL SULFATE HFA 108 (90 BASE) MCG/ACT IN AERS: 2.0000 | INHALATION_SPRAY | Freq: Four times a day (QID) | RESPIRATORY_TRACT | Status: AC | PRN

## 2014-07-30 MED ORDER — AZITHROMYCIN 250 MG PO TABS
250.0000 mg | ORAL_TABLET | Freq: Every day | ORAL | Status: DC
Start: 2014-07-30 — End: 2014-10-11

## 2014-07-30 NOTE — Telephone Encounter (Signed)
Pt son calling stating they just left PCP and was given Azithromycin 250 mg Was told to ask [place] if he may stop amiodarone for 2 weeks until this is done. Please advise.

## 2014-07-30 NOTE — Telephone Encounter (Signed)
OK to hold amiodarone for 2 weeks while he is on the antibiotic

## 2014-07-30 NOTE — Progress Notes (Signed)
Name: [name]   MRN: [id number]    DOB: 1912-07-14   Date:07/30/2014       Progress Note  Subjective  Chief Complaint  Chief Complaint  Patient presents with  . Acute Visit    cough, sore from coughing    Cough This is a recurrent problem. The current episode started more than 1 month ago (present for 2.5 months). The problem has been unchanged. The cough is productive of purulent sputum (greensih and white sputum). Associated symptoms include chills (had 1 episode of chillls last week and his temp at that time was 101F.), a fever, nasal congestion and shortness of breath. Pertinent negatives include no hemoptysis or sore throat. He has tried a beta-agonist inhaler, oral steroids, steroid inhaler and prescription cough suppressant for the symptoms. The treatment provided moderate relief. His past medical history is significant for bronchiectasis. There is no history of asthma, COPD or emphysema.  he has been seen twice by pulmonologist and has been started on oral steroid, antibiotic, and an inhaled steroid. This has not helped with his symptoms and he continues to cough and produce mucus. He feels very fatigued as a result of constant coughing.    Past Medical History  Diagnosis Date  . AAA (abdominal aortic aneurysm) 2005    Open AAA repair  . S/P CABG (coronary artery bypass graft) 2003    At [facility]; PCI 2004; no LHC since 2004  . Echocardiogram abnormal 11/10    Mild LVH, EF 55%, mild AI, diastolic dysfunction  . Hypertension     Unspecified  . Hyperlipidemia     Mixed  . History of orthostatic hypotension   . Carotid artery disease     Right basal ganglia CVA 11/10; CTA neck showed 90% RICA stenosis, 75-80% LICA stenosis  . Coronary artery disease     a. s/p CABG 1995 b. PCI in 2004  . Hx of bronchitis   . Pneumonia     HISTORY  . Gout   . History of AAA (abdominal aortic aneurysm) repair 07/09/2014    2008     Past Surgical History  Procedure Laterality Date  .  Coronary artery bypass graft  2003  . Abdominal aortic aneurysm repair  2005    Family History  Problem Relation Age of Onset  . Diabetes Other   . Coronary artery disease Other   . Heart disease Other     History   Social History  . Marital Status: Married    Spouse Name: [name]  . Number of Children: [name]  . Years of Education: [name]   Occupational History  . Textile industry     Retired   Social History Main Topics  . Smoking status: Never Smoker   . Smokeless tobacco: Never Used  . Alcohol Use: No  . Drug Use: No  . Sexual Activity: Not Currently   Other Topics Concern  . Not on file   Social History Narrative   Lives alone, does all housework and yardwork.   Son lives nearby.     Current outpatient prescriptions:  .  ALPRAZolam (XANAX) 1 MG tablet, Take 1 mg by mouth daily., Disp: , Rfl:  .  amiodarone (PACERONE) 400 MG tablet, 200 mg (1/2 tablet) once a week, Disp: 14 tablet, Rfl: 4 .  clopidogrel (PLAVIX) 75 MG tablet, Take 1 tablet by mouth daily., Disp: , Rfl:  .  finasteride (PROSCAR) 5 MG tablet, Take 1 tablet by mouth daily., Disp: , Rfl:  .  HYDROcodone-homatropine (HYCODAN) 5-1.5 MG/5ML syrup, Take 5 mLs by mouth every 6 (six) hours as needed for cough., Disp: , Rfl:  .  Multiple Vitamin (MULTIVITAMIN) tablet, Take 1 tablet by mouth daily., Disp: , Rfl:  .  potassium chloride 20 MEQ TBCR, Take 10 mEq by mouth daily., Disp: 60 tablet, Rfl: 3 .  pravastatin (PRAVACHOL) 40 MG tablet, Take 40 mg by mouth daily. , Disp: , Rfl:  .  predniSONE (DELTASONE) 5 MG tablet, Take 5 mg by mouth daily with breakfast., Disp: , Rfl:  .  timolol (TIMOPTIC) 0.5 % ophthalmic solution, 1 drop daily., Disp: , Rfl:  .  torsemide (DEMADEX) 20 MG tablet, Take 1 tablet by mouth daily., Disp: , Rfl:  .  aspirin 81 MG EC tablet, Take 81 mg by mouth daily.  , Disp: , Rfl:  .  RA COL-RITE 100 MG capsule, Take 100 mg by mouth 2 (two) times daily., Disp: , Rfl: 0 .  torsemide (DEMADEX) 20  MG tablet, Take 20 mg by mouth daily. , Disp: , Rfl:   No Known Allergies   Review of Systems  Constitutional: Positive for fever and chills (had 1 episode of chillls last week and his temp at that time was 101F.).  HENT: Negative for sore throat.   Respiratory: Positive for cough and shortness of breath. Negative for hemoptysis.       Objective  Filed Vitals:   07/30/14 1455  BP: 100/80  Pulse: 64  Temp: 98.3 F (36.8 C)  TempSrc: Oral  Height:  (1.651 m)  Weight: 164 lb 14.4 oz (74.798 kg)  SpO2: 93%    Physical Exam  Constitutional: He is oriented to person, place, and time and well-developed, well-nourished, and in no distress.  HENT:  Head: Normocephalic and atraumatic.  Mouth/Throat: No posterior oropharyngeal erythema.  Cardiovascular: Normal rate and regular rhythm.   Pulmonary/Chest: Effort normal. He has rales in the right middle field and the left middle field.  Neurological: He is alert and oriented to person, place, and time.  Nursing note and vitals reviewed.         Assessment & Plan 1. Bronchiectasis with acute exacerbation We will obtain a sputum culture, AFB culture, and a chest x-ray for evaluation of acute exacerbation of bronchiectasis. He has been on cephalosporin in the past prescribed by pulmonologist. We will start patient on azithromycin, but have asked patient to confirm with his cardiologist if it is okay to take azithromycin along with amiodarone because of the risk of QT interval prolongation and subsequent arrhythmias.patient verbalized understanding. Follow-up in 10 days. - DG Chest 2 View; Future - Sputum culture - AFB Culture & Smear - azithromycin (ZITHROMAX) 250 MG tablet; Take 1 tablet (250 mg total) by mouth daily.  Dispense: 10 tablet; Refill: 0  2. Constipation, unspecified constipation type Recommended Miralax 1 capsul in 8 oz of liquid daily.  There are no diagnoses linked to this encounter.  [name] A.  [facility] 07/30/2014 3:11 PM

## 2014-07-31 NOTE — Progress Notes (Signed)
Called Pt, LM of chest xray result.

## 2014-07-31 NOTE — Telephone Encounter (Signed)
Spoke w/ pt's son.  Advised him of Dr. Calton DachNaher's recommendation.  He verbalizes understanding and is appreciative of the call.

## 2014-08-02 ENCOUNTER — Ambulatory Visit (INDEPENDENT_AMBULATORY_CARE_PROVIDER_SITE_OTHER): Payer: Medicare Other | Admitting: Cardiovascular Disease

## 2014-08-02 ENCOUNTER — Encounter: Payer: Self-pay | Admitting: Cardiovascular Disease

## 2014-08-02 VITALS — BP 122/80 | HR 85 | Temp 98.8°F | Ht 65.0 in | Wt 164.2 lb

## 2014-08-02 DIAGNOSIS — R0602 Shortness of breath: Secondary | ICD-10-CM | POA: Diagnosis not present

## 2014-08-02 DIAGNOSIS — I2510 Atherosclerotic heart disease of native coronary artery without angina pectoris: Secondary | ICD-10-CM

## 2014-08-02 DIAGNOSIS — R0789 Other chest pain: Secondary | ICD-10-CM | POA: Diagnosis not present

## 2014-08-02 DIAGNOSIS — I5022 Chronic systolic (congestive) heart failure: Secondary | ICD-10-CM

## 2014-08-02 MED ORDER — AMIODARONE HCL 200 MG PO TABS: ORAL_TABLET | ORAL | Status: AC

## 2014-08-02 NOTE — Progress Notes (Signed)
[name] Date of Birth  18-Feb-1912 [facility] [address]    [address] [place], [place]  [address] [phone]  Fax  [phone]   Problems: 1. Coronary artery disease-status post CABG 1995, status post coronary stenting in 2004. 2. Abdominal aortic aneurysm repair 3. History of carotid endarterectomy 4. CHF- EF 35-40% 5. Mitral regurgitation 6.  Aortic insufficiency 7. Sinus bradycardia 8. LBBB 9. Atrial fibrillation   History of Present Illness:  [name] is a 81 year old gentleman with a history of CAD - CABG ( 1995) and coronary stents ( 2004), AAA repair,  peripheral vascular disease. He was seen in our office 6 months ago. He had carotid endarterectomy last year.  He's been having some problems with shortness of breath he recently. The shortness of breath is especially bad at night.  He also has noted some swelling in his ankles.  He had bronchitis about 6 weeks ago and has not recovered completely since that time.   He's been using an inhaler twice a day for the past several weeks.  He eats out at restaurants approximately one meal each day. He may be getting a little bit of extra salt. He does not add salt to his food.  He occasionally has some episodes of very mild chest tightness. It is not associated with any specific activities such as exercise, eating, drinking. He has not tried any nitroglycerin for that tightness yet.  His echocardiogram performed following his last visit revealed an ejection fraction between 35 and 40%. He has mild to moderate MR, mild to moderate TR, and mild to moderate aortic insufficiency.  September 30, 2011 We started him on Lasix 20 mg a day last year and he feels much better.  He notes a bit of swelling particularly in his left foot.  March 10, 2012: [name] presents today for evaluation of some palpitations and some lightheadedness. He's been having dizziness for the past several weeks. He still able to do all  of his normal activities.  Jun 07, 2012: [name] continues to do well.  He put in a garden this year ( tomatoes, peppers, cucs, onions) .   He has stopped his eye drops since we last saw him for a HR of 48.  His HR has now improved to 72.  August 30, 2012  [name] was hospitalized with CHF at [facility] last week.  He also had atrial fibrillation and was DCd on amiodarone.   The cardura and furosemide were stopped.  He was placed on Torsemide instead of Lasix.     He is feeling better at this point.    Nov. 5, 2014:  He was seen by [place] about 1 month ago.  He had in the hospital at [facility] for CHF.   He developed cardiorenal syndrome.    His ACE-inhibitor was stopped.   He is feeling much better.  He was out mowing with the riding mower this week .  Creatinine had improved slightly  - still not back to baseline.  He tries to avoid salt.   Feb. 4, 2015:  [name] is doing okay. He continues to eat some extra salt. On his own, he increase his Demadex to 40 mg in the morning.  He still eats sausage in the mornings. Also put salt on his eggs. . That long discussion about getting salt substitute.  His eye doctor has also started him on timolol eyedrops.  Jun 12, 2013:  [name] is about  the same.  He has continued to have issues with volume overload.   He's cut out all of his extra salt and is feeling quite a bit better.   His eye doctor put him back on Timolol eye drops and as a result, his HR is quite a bit lower today.    08/29/2013:  He had a bad episode of gout  While at [facility] recently.    He was give motrin and a narcotic but no colchicine.   He has been losing weight slowly.    He is feeling well    Jan. 6, 2016:    [name] who is seen for his CHF and leg edema.  Just recently had his 89th birthday. Doing well., no cp. Not much leg edema  August 02, 2014:  [name] is seen today for follow up of a recent episode of paroxysmal atrial fib.      Current Outpatient  Prescriptions on File Prior to Visit  Medication Sig Dispense Refill  . albuterol (PROVENTIL HFA;VENTOLIN HFA) 108 (90 BASE) MCG/ACT inhaler Inhale 2 puffs into the lungs every 6 (six) hours as needed for wheezing or shortness of breath. 1 Inhaler 2  . ALPRAZolam (XANAX) 1 MG tablet Take 1 mg by mouth daily.    Marland Kitchen aspirin 81 MG EC tablet Take 81 mg by mouth daily.      Marland Kitchen azithromycin (ZITHROMAX) 250 MG tablet Take 1 tablet (250 mg total) by mouth daily. 10 tablet 0  . clopidogrel (PLAVIX) 75 MG tablet Take 1 tablet by mouth daily.    . finasteride (PROSCAR) 5 MG tablet Take 1 tablet by mouth daily.    Marland Kitchen HYDROcodone-homatropine (HYCODAN) 5-1.5 MG/5ML syrup Take 5 mLs by mouth every 6 (six) hours as needed for cough.    . Multiple Vitamin (MULTIVITAMIN) tablet Take 1 tablet by mouth daily.    . potassium chloride 20 MEQ TBCR Take 10 mEq by mouth daily. 60 tablet 3  . pravastatin (PRAVACHOL) 40 MG tablet Take 40 mg by mouth daily.     . predniSONE (DELTASONE) 5 MG tablet Take 5 mg by mouth daily with breakfast.    . timolol (TIMOPTIC) 0.5 % ophthalmic solution 1 drop daily.    Marland Kitchen torsemide (DEMADEX) 20 MG tablet Take 20 mg by mouth daily.     Marland Kitchen amiodarone (PACERONE) 400 MG tablet 200 mg (1/2 tablet) once a week (Patient not taking: Reported on 08/02/2014) 14 tablet 4   No current facility-administered medications on file prior to visit.    No Known Allergies  Past Medical History  Diagnosis Date  . AAA (abdominal aortic aneurysm) 2005    Open AAA repair  . S/P CABG (coronary artery bypass graft) 2003    At [facility]; PCI 2004; no LHC since 2004  . Echocardiogram abnormal 11/10    Mild LVH, EF 55%, mild AI, diastolic dysfunction  . Hypertension     Unspecified  . Hyperlipidemia     Mixed  . History of orthostatic hypotension   . Carotid artery disease     Right basal ganglia CVA 11/10; CTA neck showed 90% RICA stenosis, 75-80% LICA stenosis  . Coronary artery disease     a. s/p CABG 1995 b.  PCI in 2004  . Hx of bronchitis   . Pneumonia     HISTORY  . Gout   . History of AAA (abdominal aortic aneurysm) repair 07/09/2014    2008  Past Surgical History  Procedure Laterality Date  . Coronary artery bypass graft  2003  . Abdominal aortic aneurysm repair  2005    History  Smoking status  . Never Smoker   Smokeless tobacco  . Never Used    History  Alcohol Use No    Family History  Problem Relation Age of Onset  . Family history unknown: Yes    Reviw of Systems:  Reviewed in the HPI.  All other systems are negative.  Physical Exam: BP 122/80 mmHg  Pulse 85  Ht 5\' 5"  (1.651 m)  Wt 74.503 kg (164 lb 4 oz)  BMI 27.33 kg/m2 The patient is alert and oriented x 3.  The mood and affect are normal.   Skin: warm and dry.  Color is normal.    HEENT:   Normocephalic/atraumatic. His carotids are normal.  He has a well healed CEA scar on his right neck.   Lungs: rales in left base    Heart: Regular rate S1-S2. He has a soft 2/6 systolic murmur radiating to the upper right sternal border.     Abdomen: His abdominal exam reveals good bowel sounds.  Extremities:  His no clubbing cyanosis.   He has no leg edema today.   Neuro:  Neuro exam is nonfocal. He is hard of hearing.    ECG:  August 02, 2014:  NSR with frequent PVCs   Assessment / Plan:   1. CAD - remote MI/remote CABG/PCI - no active symptoms - would continue with medical management.   2. ?Stroke - he continues on Plavix .    3. Recent alveolar hemorrhage/pneumonia - noted to be improved from prior scan - no active symptoms. I have stopped his aspirin since we will continue Plavix for now. They are to see pulmonary. Explained that if he has recurrence, Plavix will need to be stopped.   4. Systolic HF - continue current meds   5. Valvular heart disease  Will see him in 6 months .     Nahser, Deloris PingPhilip J, MD  08/02/2014 4:06 PM    [facility] [address]  [address] [place], [place]  [address] Pager [phone]- [phone] Phone: [phone]) [phone]; Fax: [phone]   [facility]  [address] [place], [place]  [address] [phone]    Fax [phone]) [phone]

## 2014-08-02 NOTE — Patient Instructions (Signed)
Medication Instructions: - no changes today  Labwork: - none  Procedures/Testing: - none  Follow-Up: - Your physician wants you to follow-up in: 6 months with Dr. Elease HashimotoNahser (December 2016) You will receive a reminder letter in the mail two months in advance. If you don't receive a letter, please call our office to schedule the follow-up appointment.  Any Additional Special Instructions Will Be Listed Below (If Applicable). - none

## 2014-08-03 LAB — LOWER RESPIRATORY CULTURE

## 2014-08-09 ENCOUNTER — Encounter: Payer: Self-pay | Admitting: Family Medicine

## 2014-08-09 ENCOUNTER — Telehealth: Payer: Self-pay | Admitting: Family Medicine

## 2014-08-09 ENCOUNTER — Ambulatory Visit (INDEPENDENT_AMBULATORY_CARE_PROVIDER_SITE_OTHER): Payer: Medicare Other | Admitting: Family Medicine

## 2014-08-09 VITALS — BP 116/82 | HR 79 | Wt 162.2 lb

## 2014-08-09 DIAGNOSIS — J4489 Other specified chronic obstructive pulmonary disease: Secondary | ICD-10-CM | POA: Insufficient documentation

## 2014-08-09 DIAGNOSIS — I2510 Atherosclerotic heart disease of native coronary artery without angina pectoris: Secondary | ICD-10-CM

## 2014-08-09 DIAGNOSIS — J449 Chronic obstructive pulmonary disease, unspecified: Secondary | ICD-10-CM | POA: Diagnosis not present

## 2014-08-09 NOTE — Progress Notes (Signed)
Name: [name]   MRN: [id number]    DOB: 13-Mar-1912   Date:08/09/2014       Progress Note  Subjective  Chief Complaint  Chief Complaint  Patient presents with  . Follow-up    10 day      HPI COPD: Feeling better, day 10/10 of ABx therapy. Coughing has improved significantly but he is still making mucous. No Fevers, chills, or fatigue recently. He has some shortness of breath but not worse from baseline. He is continuing to use his rescue inhaler and the inhaled corticosteroid. CXR shows COPD but no acute process.   Past Medical History  Diagnosis Date  . AAA (abdominal aortic aneurysm) 2005    Open AAA repair  . S/P CABG (coronary artery bypass graft) 2003    At [facility]; PCI 2004; no LHC since 2004  . Echocardiogram abnormal 11/10    Mild LVH, EF 55%, mild AI, diastolic dysfunction  . Hypertension     Unspecified  . Hyperlipidemia     Mixed  . History of orthostatic hypotension   . Carotid artery disease     Right basal ganglia CVA 11/10; CTA neck showed 90% RICA stenosis, 75-80% LICA stenosis  . Coronary artery disease     a. s/p CABG 1995 b. PCI in 2004  . Hx of bronchitis   . Pneumonia     HISTORY  . Gout   . History of AAA (abdominal aortic aneurysm) repair 07/09/2014    2008     Past Surgical History  Procedure Laterality Date  . Coronary artery bypass graft  2003  . Abdominal aortic aneurysm repair  2005    Family History  Problem Relation Age of Onset  . Family history unknown: Yes    History   Social History  . Marital Status: Married    Spouse Name: N/A  . Number of Children: N/A  . Years of Education: N/A   Occupational History  . Textile industry     Retired   Social History Main Topics  . Smoking status: Never Smoker   . Smokeless tobacco: Never Used  . Alcohol Use: No  . Drug Use: No  . Sexual Activity: Not Currently   Other Topics Concern  . Not on file   Social History Narrative   Lives alone, does all housework and yardwork.    Son lives nearby.     Current outpatient prescriptions:  .  albuterol (PROVENTIL HFA;VENTOLIN HFA) 108 (90 BASE) MCG/ACT inhaler, Inhale 2 puffs into the lungs every 6 (six) hours as needed for wheezing or shortness of breath., Disp: 1 Inhaler, Rfl: 2 .  ALPRAZolam (XANAX) 1 MG tablet, Take 1 mg by mouth daily., Disp: , Rfl:  .  amiodarone (PACERONE) 200 MG tablet, Take one tablet by mouth once a week, Disp: 30 tablet, Rfl: 6 .  aspirin 81 MG EC tablet, Take 81 mg by mouth daily.  , Disp: , Rfl:  .  azithromycin (ZITHROMAX) 250 MG tablet, Take 1 tablet (250 mg total) by mouth daily., Disp: 10 tablet, Rfl: 0 .  clopidogrel (PLAVIX) 75 MG tablet, Take 1 tablet by mouth daily., Disp: , Rfl:  .  finasteride (PROSCAR) 5 MG tablet, Take 1 tablet by mouth daily., Disp: , Rfl:  .  HYDROcodone-homatropine (HYCODAN) 5-1.5 MG/5ML syrup, Take 5 mLs by mouth every 6 (six) hours as needed for cough., Disp: , Rfl:  .  Multiple Vitamin (MULTIVITAMIN) tablet, Take 1 tablet by mouth daily., Disp: ,  Rfl:  .  potassium chloride 20 MEQ TBCR, Take 10 mEq by mouth daily., Disp: 60 tablet, Rfl: 3 .  pravastatin (PRAVACHOL) 40 MG tablet, Take 40 mg by mouth daily. , Disp: , Rfl:  .  predniSONE (DELTASONE) 5 MG tablet, Take 5 mg by mouth daily with breakfast., Disp: , Rfl:  .  timolol (TIMOPTIC) 0.5 % ophthalmic solution, 1 drop daily., Disp: , Rfl:  .  torsemide (DEMADEX) 20 MG tablet, Take 20 mg by mouth daily. , Disp: , Rfl:   No Known Allergies   Review of Systems  Constitutional: Negative for fever, chills and malaise/fatigue.  Respiratory: Positive for cough, sputum production and shortness of breath. Negative for hemoptysis and wheezing.   Cardiovascular: Negative for chest pain.      Objective  Filed Vitals:   08/09/14 1048  BP: 116/82  Pulse: 79  Weight: 162 lb 3.2 oz (73.573 kg)  SpO2: 93%    Physical Exam  Constitutional: He is well-developed, well-nourished, and in no distress.  HENT:   Head: Normocephalic and atraumatic.  Neck: Normal range of motion.  Cardiovascular: Regular rhythm.   Murmur heard. Pulmonary/Chest: Effort normal. He has rales.  Nursing note and vitals reviewed.   Assessment & Plan 1. COPD (chronic obstructive pulmonary disease) with chronic bronchitis Pt. is on Inhaled steroid and rescue inhaler. He is on last day of ABx therapy. CXR has been reviewed which show no pneumonia but evidence of COPD. He feels improved symptomatically. If after completing his ABx course, his symptoms starts to return, he will contact his Pulmonologist Dr. Meredeth IdeFleming.    [name] Asad A. Faylene [facility]zShah [facility] 08/09/2014 12:00 PM

## 2014-08-09 NOTE — Telephone Encounter (Signed)
PT WANTS TO KNOW IF BEING OUT SIDE HAVE ANY NEGATIVE EFFECT ON HIS PROBLEM. PLEASE CALL DAUGHTER BETTY . CAN LEAVE A MESSAGE IF THEY DO NOT ANSWER.

## 2014-08-10 NOTE — Telephone Encounter (Signed)
Called and spoke with the patient. Affirmed that patient may sit outside in the patio and walk in the yard and that should not cause any problems or worsen his condition.

## 2014-08-14 ENCOUNTER — Telehealth: Payer: Self-pay | Admitting: *Deleted

## 2014-08-14 ENCOUNTER — Other Ambulatory Visit: Payer: Self-pay | Admitting: *Deleted

## 2014-08-14 MED ORDER — TORSEMIDE 20 MG PO TABS: 20.0000 mg | ORAL_TABLET | Freq: Every day | ORAL | Status: DC

## 2014-08-14 NOTE — Telephone Encounter (Signed)
  1. Which medications need to be refilled?Toresemide 20mg    2. Which pharmacy is medication to be sent to? [facility]    [address]  3. Do they need a 30 day or 90 day supply? 90 day  4. Would they like a call back once the medication has been sent to the pharmacy? Yes... Son Peyton NajjarLarry [phone])

## 2014-08-14 NOTE — Telephone Encounter (Signed)
Patient gave wrong pharmacy:  [facility] please   [address]    Not [facility]

## 2014-09-06 ENCOUNTER — Ambulatory Visit: Payer: Medicare Other | Admitting: Family Medicine

## 2014-09-11 ENCOUNTER — Telehealth: Payer: Self-pay | Admitting: Family Medicine

## 2014-09-11 MED ORDER — FINASTERIDE 5 MG PO TABS: 5.0000 mg | ORAL_TABLET | Freq: Every day | ORAL | Status: DC

## 2014-09-11 MED ORDER — ALPRAZOLAM 1 MG PO TABS: 1.0000 mg | ORAL_TABLET | Freq: Every day | ORAL | Status: DC

## 2014-09-11 MED ORDER — PRAVASTATIN SODIUM 40 MG PO TABS: 40.0000 mg | ORAL_TABLET | Freq: Every day | ORAL | Status: DC

## 2014-09-11 NOTE — Telephone Encounter (Signed)
Patients son came into the office and states his dad needs refills on Alprazolam, Finasteride and Pravastatin for 90 days. Pt would like a 90 day supply and he needs these handwritten due to the fact that pt has no RX insurance. He needs to shop around to the pharmacies to get price checks.

## 2014-09-11 NOTE — Telephone Encounter (Signed)
Medication has been refilled and printed patient will come in and pick up prescriptions, he needed them printed due to he does not have insurance and needs to shop around for lower prices.

## 2014-09-14 LAB — ACID-FAST (MYCOBACTERIA) SMEAR AND CULTURE WITH REFLEX TO IDENTIFICATION: Acid Fast Culture: NEGATIVE

## 2014-09-24 ENCOUNTER — Other Ambulatory Visit: Payer: Self-pay | Admitting: Family Medicine

## 2014-09-24 MED ORDER — CLOPIDOGREL BISULFATE 75 MG PO TABS: 75.0000 mg | ORAL_TABLET | Freq: Every day | ORAL | Status: DC

## 2014-09-24 NOTE — Telephone Encounter (Signed)
Medication has been refilled and sent to [facility] [address] °

## 2014-10-11 ENCOUNTER — Ambulatory Visit (INDEPENDENT_AMBULATORY_CARE_PROVIDER_SITE_OTHER): Payer: Medicare Other | Admitting: Family Medicine

## 2014-10-11 ENCOUNTER — Encounter: Payer: Self-pay | Admitting: Family Medicine

## 2014-10-11 VITALS — BP 118/63 | HR 73 | Temp 97.6°F | Resp 18 | Ht 65.0 in | Wt 174.3 lb

## 2014-10-11 DIAGNOSIS — Z23 Encounter for immunization: Secondary | ICD-10-CM | POA: Diagnosis not present

## 2014-10-11 DIAGNOSIS — E785 Hyperlipidemia, unspecified: Secondary | ICD-10-CM | POA: Diagnosis not present

## 2014-10-11 DIAGNOSIS — G4700 Insomnia, unspecified: Secondary | ICD-10-CM | POA: Diagnosis not present

## 2014-10-11 DIAGNOSIS — I2510 Atherosclerotic heart disease of native coronary artery without angina pectoris: Secondary | ICD-10-CM

## 2014-10-11 DIAGNOSIS — N400 Enlarged prostate without lower urinary tract symptoms: Secondary | ICD-10-CM | POA: Diagnosis not present

## 2014-10-11 MED ORDER — FINASTERIDE 5 MG PO TABS: 5.0000 mg | ORAL_TABLET | Freq: Every day | ORAL | Status: AC

## 2014-10-11 MED ORDER — PRAVASTATIN SODIUM 40 MG PO TABS: 40.0000 mg | ORAL_TABLET | Freq: Every day | ORAL | Status: DC

## 2014-10-11 MED ORDER — ALPRAZOLAM 1 MG PO TABS: 1.0000 mg | ORAL_TABLET | Freq: Every evening | ORAL | Status: DC | PRN

## 2014-10-11 NOTE — Progress Notes (Signed)
Name: [name]   MRN: [id number]    DOB: Jan 08, 1913   Date:10/11/2014       Progress Note  Subjective  Chief Complaint  Chief Complaint  Patient presents with  . Follow-up    3 mo  . COPD  . Hyperlipidemia  . Medication Refill    alprazolam  / finasteride  /  pravastatin     Hyperlipidemia This is a chronic problem. Recent lipid tests were reviewed and are normal. Pertinent negatives include no leg pain or myalgias. Current antihyperlipidemic treatment includes statins. The current treatment provides significant improvement of lipids.  Benign Prostatic Hypertrophy This is a chronic problem. The problem is unchanged. Irritative symptoms do not include frequency, nocturia or urgency. Obstructive symptoms do not include dribbling, straining or a weak stream. Pertinent negatives include no chills, dysuria or hematuria. Past treatments include finasteride. The treatment provided moderate relief.  Insomnia Primary symptoms: frequent awakening.  The symptoms are aggravated by anxiety. The symptoms are relieved by medication.   Past Medical History  Diagnosis Date  . AAA (abdominal aortic aneurysm) 2005    Open AAA repair  . S/P CABG (coronary artery bypass graft) 2003    At [facility]; PCI 2004; no LHC since 2004  . Echocardiogram abnormal 11/10    Mild LVH, EF 55%, mild AI, diastolic dysfunction  . Hypertension     Unspecified  . Hyperlipidemia     Mixed  . History of orthostatic hypotension   . Carotid artery disease     Right basal ganglia CVA 11/10; CTA neck showed 90% RICA stenosis, 75-80% LICA stenosis  . Coronary artery disease     a. s/p CABG 1995 b. PCI in 2004  . Hx of bronchitis   . Pneumonia     HISTORY  . Gout   . History of AAA (abdominal aortic aneurysm) repair 07/09/2014    2008     Past Surgical History  Procedure Laterality Date  . Coronary artery bypass graft  2003  . Abdominal aortic aneurysm repair  2005    Family History  Problem Relation  Age of Onset  . Family history unknown: Yes    Social History   Social History  . Marital Status: Married    Spouse Name: N/A  . Number of Children: N/A  . Years of Education: N/A   Occupational History  . Textile industry     Retired   Social History Main Topics  . Smoking status: Never Smoker   . Smokeless tobacco: Never Used  . Alcohol Use: No  . Drug Use: No  . Sexual Activity: Not Currently   Other Topics Concern  . Not on file   Social History Narrative   Lives alone, does all housework and yardwork.   Son lives nearby.     Current outpatient prescriptions:  .  albuterol (PROVENTIL HFA;VENTOLIN HFA) 108 (90 BASE) MCG/ACT inhaler, Inhale 2 puffs into the lungs every 6 (six) hours as needed for wheezing or shortness of breath., Disp: 1 Inhaler, Rfl: 2 .  ALPRAZolam (XANAX) 1 MG tablet, Take 1 tablet (1 mg total) by mouth daily., Disp: 30 tablet, Rfl: 0 .  amiodarone (PACERONE) 200 MG tablet, Take one tablet by mouth once a week, Disp: 30 tablet, Rfl: 6 .  aspirin 81 MG EC tablet, Take 81 mg by mouth daily.  , Disp: , Rfl:  .  azithromycin (ZITHROMAX) 250 MG tablet, Take 1 tablet (250 mg total) by mouth daily., Disp: 10 tablet,  Rfl: 0 .  BREO ELLIPTA 100-25 MCG/INH AEPB, , Disp: , Rfl: 0 .  clopidogrel (PLAVIX) 75 MG tablet, Take 1 tablet (75 mg total) by mouth daily., Disp: 90 tablet, Rfl: 0 .  docusate sodium (COLACE) 100 MG capsule, Take by mouth., Disp: , Rfl:  .  finasteride (PROSCAR) 5 MG tablet, Take 1 tablet (5 mg total) by mouth daily., Disp: 90 tablet, Rfl: 0 .  HYDROcodone-homatropine (HYCODAN) 5-1.5 MG/5ML syrup, Take 5 mLs by mouth every 6 (six) hours as needed for cough., Disp: , Rfl:  .  Multiple Vitamin (MULTIVITAMIN) tablet, Take 1 tablet by mouth daily., Disp: , Rfl:  .  potassium chloride 20 MEQ TBCR, Take 10 mEq by mouth daily., Disp: 60 tablet, Rfl: 3 .  pravastatin (PRAVACHOL) 40 MG tablet, Take 1 tablet (40 mg total) by mouth daily., Disp: 90  tablet, Rfl: 0 .  predniSONE (DELTASONE) 5 MG tablet, Take 5 mg by mouth daily with breakfast., Disp: , Rfl:  .  timolol (TIMOPTIC) 0.5 % ophthalmic solution, 1 drop daily., Disp: , Rfl:  .  torsemide (DEMADEX) 20 MG tablet, Take 1 tablet (20 mg total) by mouth daily., Disp: 90 tablet, Rfl: 3  No Known Allergies   Review of Systems  Constitutional: Negative for chills.  Genitourinary: Negative for dysuria, urgency, frequency, hematuria and nocturia.  Musculoskeletal: Negative for myalgias.  Psychiatric/Behavioral: The patient has insomnia.     Objective  Filed Vitals:   10/11/14 1143  BP: 118/63  Pulse: 73  Temp: 97.6 F (36.4 C)  TempSrc: Oral  Resp: 18  Height: 5\' 5"  (1.651 m)  Weight: 174 lb 4.8 oz (79.062 kg)  SpO2: 95%    Physical Exam  Constitutional: He is well-developed, well-nourished, and in no distress.  Cardiovascular: Normal rate and regular rhythm.   Pulmonary/Chest: Effort normal and breath sounds normal.  Abdominal: Soft. Bowel sounds are normal.  Genitourinary:  DRE deferred at patient request  Neurological: He is alert.  Psychiatric: Memory, affect and judgment normal.  Nursing note and vitals reviewed.  Assessment & Plan  1. Need for immunization against influenza Vaccine against influenza provided.  2. Dyslipidemia  - pravastatin (PRAVACHOL) 40 MG tablet; Take 1 tablet (40 mg total) by mouth daily.  Dispense: 90 tablet; Refill: 0  3. Cannot sleep Symptoms responsive to alprazolam 1 mg by mouth daily at bedtime. Refills provided and follow-up in 3-4 months. - ALPRAZolam (XANAX) 1 MG tablet; Take 1 tablet (1 mg total) by mouth at bedtime as needed for anxiety.  Dispense: 90 tablet; Refill: 0  4. BPH without obstruction/lower urinary tract symptoms No concerning symptoms of urinary tract obstruction. Patient declined rectal exam. Refill for finasteride provided. Follow-up in 3-4 months. - finasteride (PROSCAR) 5 MG tablet; Take 1 tablet (5 mg  total) by mouth daily.  Dispense: 90 tablet; Refill: 0    Kazia Grisanti [facility] 10/11/2014 12:23 PM

## 2014-10-21 ENCOUNTER — Encounter: Payer: Self-pay | Admitting: Emergency Medicine

## 2014-10-21 ENCOUNTER — Emergency Department: Payer: Medicare Other

## 2014-10-21 DIAGNOSIS — K5900 Constipation, unspecified: Secondary | ICD-10-CM | POA: Diagnosis not present

## 2014-10-21 DIAGNOSIS — Z9861 Coronary angioplasty status: Secondary | ICD-10-CM

## 2014-10-21 DIAGNOSIS — R7989 Other specified abnormal findings of blood chemistry: Secondary | ICD-10-CM

## 2014-10-21 DIAGNOSIS — E876 Hypokalemia: Secondary | ICD-10-CM | POA: Diagnosis not present

## 2014-10-21 DIAGNOSIS — J42 Unspecified chronic bronchitis: Secondary | ICD-10-CM | POA: Diagnosis present

## 2014-10-21 DIAGNOSIS — R197 Diarrhea, unspecified: Secondary | ICD-10-CM | POA: Diagnosis present

## 2014-10-21 DIAGNOSIS — I350 Nonrheumatic aortic (valve) stenosis: Secondary | ICD-10-CM | POA: Diagnosis present

## 2014-10-21 DIAGNOSIS — M109 Gout, unspecified: Secondary | ICD-10-CM | POA: Diagnosis present

## 2014-10-21 DIAGNOSIS — I272 Other secondary pulmonary hypertension: Secondary | ICD-10-CM | POA: Diagnosis present

## 2014-10-21 DIAGNOSIS — N281 Cyst of kidney, acquired: Secondary | ICD-10-CM | POA: Diagnosis present

## 2014-10-21 DIAGNOSIS — E785 Hyperlipidemia, unspecified: Secondary | ICD-10-CM | POA: Diagnosis present

## 2014-10-21 DIAGNOSIS — R339 Retention of urine, unspecified: Secondary | ICD-10-CM | POA: Diagnosis present

## 2014-10-21 DIAGNOSIS — A419 Sepsis, unspecified organism: Secondary | ICD-10-CM | POA: Diagnosis present

## 2014-10-21 DIAGNOSIS — E860 Dehydration: Secondary | ICD-10-CM | POA: Diagnosis present

## 2014-10-21 DIAGNOSIS — R1310 Dysphagia, unspecified: Secondary | ICD-10-CM | POA: Diagnosis present

## 2014-10-21 DIAGNOSIS — R05 Cough: Secondary | ICD-10-CM | POA: Diagnosis not present

## 2014-10-21 DIAGNOSIS — I129 Hypertensive chronic kidney disease with stage 1 through stage 4 chronic kidney disease, or unspecified chronic kidney disease: Secondary | ICD-10-CM | POA: Diagnosis present

## 2014-10-21 DIAGNOSIS — Z515 Encounter for palliative care: Secondary | ICD-10-CM | POA: Diagnosis not present

## 2014-10-21 DIAGNOSIS — Z7952 Long term (current) use of systemic steroids: Secondary | ICD-10-CM | POA: Diagnosis not present

## 2014-10-21 DIAGNOSIS — J9600 Acute respiratory failure, unspecified whether with hypoxia or hypercapnia: Secondary | ICD-10-CM | POA: Diagnosis not present

## 2014-10-21 DIAGNOSIS — Z8249 Family history of ischemic heart disease and other diseases of the circulatory system: Secondary | ICD-10-CM

## 2014-10-21 DIAGNOSIS — N179 Acute kidney failure, unspecified: Secondary | ICD-10-CM | POA: Insufficient documentation

## 2014-10-21 DIAGNOSIS — Z8673 Personal history of transient ischemic attack (TIA), and cerebral infarction without residual deficits: Secondary | ICD-10-CM

## 2014-10-21 DIAGNOSIS — Z66 Do not resuscitate: Secondary | ICD-10-CM | POA: Diagnosis present

## 2014-10-21 DIAGNOSIS — J690 Pneumonitis due to inhalation of food and vomit: Secondary | ICD-10-CM | POA: Diagnosis not present

## 2014-10-21 DIAGNOSIS — D631 Anemia in chronic kidney disease: Secondary | ICD-10-CM | POA: Diagnosis present

## 2014-10-21 DIAGNOSIS — E871 Hypo-osmolality and hyponatremia: Secondary | ICD-10-CM | POA: Diagnosis not present

## 2014-10-21 DIAGNOSIS — F419 Anxiety disorder, unspecified: Secondary | ICD-10-CM | POA: Diagnosis present

## 2014-10-21 DIAGNOSIS — I214 Non-ST elevation (NSTEMI) myocardial infarction: Secondary | ICD-10-CM | POA: Diagnosis present

## 2014-10-21 DIAGNOSIS — N183 Chronic kidney disease, stage 3 (moderate): Secondary | ICD-10-CM | POA: Diagnosis present

## 2014-10-21 DIAGNOSIS — R778 Other specified abnormalities of plasma proteins: Secondary | ICD-10-CM

## 2014-10-21 DIAGNOSIS — I447 Left bundle-branch block, unspecified: Secondary | ICD-10-CM | POA: Diagnosis present

## 2014-10-21 DIAGNOSIS — Z7982 Long term (current) use of aspirin: Secondary | ICD-10-CM | POA: Diagnosis not present

## 2014-10-21 DIAGNOSIS — I480 Paroxysmal atrial fibrillation: Secondary | ICD-10-CM | POA: Diagnosis present

## 2014-10-21 DIAGNOSIS — R509 Fever, unspecified: Secondary | ICD-10-CM

## 2014-10-21 DIAGNOSIS — N170 Acute kidney failure with tubular necrosis: Secondary | ICD-10-CM | POA: Diagnosis present

## 2014-10-21 DIAGNOSIS — I5022 Chronic systolic (congestive) heart failure: Secondary | ICD-10-CM | POA: Diagnosis present

## 2014-10-21 DIAGNOSIS — Z951 Presence of aortocoronary bypass graft: Secondary | ICD-10-CM

## 2014-10-21 DIAGNOSIS — I2510 Atherosclerotic heart disease of native coronary artery without angina pectoris: Secondary | ICD-10-CM | POA: Diagnosis present

## 2014-10-21 DIAGNOSIS — I5042 Chronic combined systolic (congestive) and diastolic (congestive) heart failure: Secondary | ICD-10-CM | POA: Diagnosis not present

## 2014-10-21 DIAGNOSIS — R059 Cough, unspecified: Secondary | ICD-10-CM | POA: Insufficient documentation

## 2014-10-21 DIAGNOSIS — Z8679 Personal history of other diseases of the circulatory system: Secondary | ICD-10-CM

## 2014-10-21 DIAGNOSIS — I340 Nonrheumatic mitral (valve) insufficiency: Secondary | ICD-10-CM | POA: Diagnosis not present

## 2014-10-21 DIAGNOSIS — N186 End stage renal disease: Secondary | ICD-10-CM

## 2014-10-21 DIAGNOSIS — R0602 Shortness of breath: Secondary | ICD-10-CM

## 2014-10-21 HISTORY — DX: Chronic kidney disease, unspecified: N18.9

## 2014-10-21 LAB — COMPREHENSIVE METABOLIC PANEL
ALBUMIN: 3.6 g/dL (ref 3.5–5.0)
ALK PHOS: 84 U/L (ref 38–126)
ALT: 14 U/L — ABNORMAL LOW (ref 17–63)
ANION GAP: 15 (ref 5–15)
AST: 81 U/L — AB (ref 15–41)
BILIRUBIN TOTAL: 1.3 mg/dL — AB (ref 0.3–1.2)
BUN: 55 mg/dL — AB (ref 6–20)
CALCIUM: 8.8 mg/dL — AB (ref 8.9–10.3)
CO2: 20 mmol/L — AB (ref 22–32)
Chloride: 100 mmol/L — ABNORMAL LOW (ref 101–111)
Creatinine, Ser: 3.02 mg/dL — ABNORMAL HIGH (ref 0.61–1.24)
GFR calc Af Amer: 18 mL/min — ABNORMAL LOW (ref >60)
GFR calc non Af Amer: 16 mL/min — ABNORMAL LOW (ref >60)
GLUCOSE: 159 mg/dL — AB (ref 65–99)
Potassium: 3.4 mmol/L — ABNORMAL LOW (ref 3.5–5.1)
SODIUM: 135 mmol/L (ref 135–145)
Total Protein: 7.5 g/dL (ref 6.5–8.1)

## 2014-10-21 LAB — CBC WITH DIFFERENTIAL/PLATELET
Basophils Absolute: 0 10*3/uL (ref 0–0.1)
Basophils Relative: 0 %
EOS PCT: 0 %
Eosinophils Absolute: 0 10*3/uL (ref 0–0.7)
HEMATOCRIT: 36.8 % — AB (ref 40.0–52.0)
Hemoglobin: 12.4 g/dL — ABNORMAL LOW (ref 13.0–18.0)
LYMPHS PCT: 14 %
Lymphs Abs: 1.3 10*3/uL (ref 1.0–3.6)
MCH: 31.6 pg (ref 26.0–34.0)
MCHC: 33.6 g/dL (ref 32.0–36.0)
MCV: 94.1 fL (ref 80.0–100.0)
MONO ABS: 0.9 10*3/uL (ref 0.2–1.0)
MONOS PCT: 10 %
NEUTROS ABS: 7.3 10*3/uL — AB (ref 1.4–6.5)
Neutrophils Relative %: 76 %
PLATELETS: 165 10*3/uL (ref 150–440)
RBC: 3.91 MIL/uL — ABNORMAL LOW (ref 4.40–5.90)
RDW: 14.2 % (ref 11.5–14.5)
WBC: 9.5 10*3/uL (ref 3.8–10.6)

## 2014-10-21 LAB — PROTIME-INR
INR: 1.35
Prothrombin Time: 16.9 seconds — ABNORMAL HIGH (ref 11.4–15.0)

## 2014-10-21 LAB — LACTIC ACID, PLASMA: Lactic Acid, Venous: 2.3 mmol/L (ref 0.5–2.0)

## 2014-10-21 LAB — TROPONIN I: Troponin I: 17.1 ng/mL — ABNORMAL HIGH (ref <0.031)

## 2014-10-21 LAB — APTT: aPTT: 35 seconds (ref 24–36)

## 2014-10-21 MED ORDER — ACETAMINOPHEN 325 MG PO TABS
650.0000 mg | ORAL_TABLET | Freq: Four times a day (QID) | ORAL | Status: DC | PRN
  Administered 2014-10-27: 650 mg via ORAL
  Filled 2014-10-21 (×2): qty 2

## 2014-10-21 MED ORDER — ACETAMINOPHEN 650 MG RE SUPP: 650.0000 mg | Freq: Four times a day (QID) | RECTAL | Status: DC | PRN

## 2014-10-21 MED ORDER — PRAVASTATIN SODIUM 20 MG PO TABS
40.0000 mg | ORAL_TABLET | Freq: Every day | ORAL | Status: DC
  Administered 2014-10-22 – 2014-10-26 (×5): 40 mg via ORAL
  Filled 2014-10-21 (×5): qty 2

## 2014-10-21 MED ORDER — SODIUM CHLORIDE 0.9 % IV SOLN
INTRAVENOUS | Status: AC
  Administered 2014-10-21 – 2014-10-22 (×2): via INTRAVENOUS

## 2014-10-21 MED ORDER — HYDROCODONE-HOMATROPINE 5-1.5 MG/5ML PO SYRP
5.0000 mL | ORAL_SOLUTION | Freq: Four times a day (QID) | ORAL | Status: DC | PRN
Start: 2014-10-21 — End: 2014-11-01
  Administered 2014-10-27: 5 mL via ORAL
  Filled 2014-10-21: qty 5

## 2014-10-21 MED ORDER — HEPARIN (PORCINE) IN NACL 100-0.45 UNIT/ML-% IJ SOLN: 12.0000 [IU]/kg/h | Freq: Once | INTRAMUSCULAR | Status: DC

## 2014-10-21 MED ORDER — VANCOMYCIN HCL 10 G IV SOLR
1500.0000 mg | Freq: Once | INTRAVENOUS | Status: DC
  Filled 2014-10-21: qty 1500

## 2014-10-21 MED ORDER — MOMETASONE FURO-FORMOTEROL FUM 100-5 MCG/ACT IN AERO
2.0000 | INHALATION_SPRAY | Freq: Two times a day (BID) | RESPIRATORY_TRACT | Status: DC
  Administered 2014-10-22 – 2014-10-31 (×19): 2 via RESPIRATORY_TRACT
  Filled 2014-10-21: qty 8.8

## 2014-10-21 MED ORDER — ALBUTEROL SULFATE (2.5 MG/3ML) 0.083% IN NEBU
3.0000 mL | INHALATION_SOLUTION | Freq: Four times a day (QID) | RESPIRATORY_TRACT | Status: DC | PRN
  Administered 2014-10-27 – 2014-10-30 (×2): 3 mL via RESPIRATORY_TRACT
  Filled 2014-10-21 (×2): qty 3

## 2014-10-21 MED ORDER — AMIODARONE HCL 200 MG PO TABS
200.0000 mg | ORAL_TABLET | ORAL | Status: DC
  Administered 2014-10-22 – 2014-10-29 (×2): 200 mg via ORAL
  Filled 2014-10-21 (×2): qty 1

## 2014-10-21 MED ORDER — ADULT MULTIVITAMIN W/MINERALS CH
1.0000 | ORAL_TABLET | Freq: Every day | ORAL | Status: DC
  Administered 2014-10-22 – 2014-10-30 (×9): 1 via ORAL
  Filled 2014-10-21 (×9): qty 1

## 2014-10-21 MED ORDER — CLOPIDOGREL BISULFATE 75 MG PO TABS
75.0000 mg | ORAL_TABLET | Freq: Every day | ORAL | Status: DC
  Administered 2014-10-22 – 2014-10-30 (×9): 75 mg via ORAL
  Filled 2014-10-21 (×10): qty 1

## 2014-10-21 MED ORDER — TIMOLOL MALEATE 0.5 % OP SOLN
1.0000 [drp] | Freq: Every day | OPHTHALMIC | Status: DC
  Administered 2014-10-22 – 2014-10-31 (×10): 1 [drp] via OPHTHALMIC
  Filled 2014-10-21: qty 5

## 2014-10-21 MED ORDER — HEPARIN SODIUM (PORCINE) 5000 UNIT/ML IJ SOLN: 4000.0000 [IU] | Freq: Once | INTRAMUSCULAR | Status: AC

## 2014-10-21 MED ORDER — SODIUM CHLORIDE 0.9 % IV SOLN
1000.0000 mL | INTRAVENOUS | Status: DC
  Administered 2014-10-21: 1000 mL via INTRAVENOUS

## 2014-10-21 MED ORDER — PREDNISONE 50 MG PO TABS
60.0000 mg | ORAL_TABLET | Freq: Every day | ORAL | Status: DC
  Administered 2014-10-22 – 2014-10-28 (×7): 60 mg via ORAL
  Filled 2014-10-21 (×8): qty 1

## 2014-10-21 MED ORDER — ALPRAZOLAM 1 MG PO TABS
1.0000 mg | ORAL_TABLET | Freq: Every evening | ORAL | Status: DC | PRN
  Administered 2014-10-27: 1 mg via ORAL
  Filled 2014-10-21 (×2): qty 1

## 2014-10-21 MED ORDER — HEPARIN BOLUS VIA INFUSION
4000.0000 [IU] | Freq: Once | INTRAVENOUS | Status: AC
  Administered 2014-10-21: 4000 [IU] via INTRAVENOUS
  Filled 2014-10-21: qty 4000

## 2014-10-21 MED ORDER — FINASTERIDE 5 MG PO TABS
5.0000 mg | ORAL_TABLET | Freq: Every day | ORAL | Status: DC
  Administered 2014-10-22 – 2014-10-30 (×9): 5 mg via ORAL
  Filled 2014-10-21 (×9): qty 1

## 2014-10-21 MED ORDER — SODIUM CHLORIDE 0.9 % IJ SOLN
3.0000 mL | Freq: Two times a day (BID) | INTRAMUSCULAR | Status: DC
  Administered 2014-10-22 – 2014-10-31 (×14): 3 mL via INTRAVENOUS

## 2014-10-21 MED ORDER — PIPERACILLIN-TAZOBACTAM 3.375 G IVPB
3.3750 g | Freq: Two times a day (BID) | INTRAVENOUS | Status: DC
  Administered 2014-10-22 – 2014-10-24 (×5): 3.375 g via INTRAVENOUS
  Filled 2014-10-21 (×7): qty 50

## 2014-10-21 MED ORDER — COLCHICINE 0.6 MG PO TABS
0.6000 mg | ORAL_TABLET | Freq: Every day | ORAL | Status: DC
  Administered 2014-10-21 – 2014-10-24 (×4): 0.6 mg via ORAL
  Filled 2014-10-21 (×4): qty 1

## 2014-10-21 MED ORDER — IPRATROPIUM-ALBUTEROL 0.5-2.5 (3) MG/3ML IN SOLN
3.0000 mL | RESPIRATORY_TRACT | Status: DC
  Administered 2014-10-22 – 2014-10-24 (×14): 3 mL via RESPIRATORY_TRACT
  Filled 2014-10-21 (×14): qty 3

## 2014-10-21 MED ORDER — HEPARIN (PORCINE) IN NACL 100-0.45 UNIT/ML-% IJ SOLN
12.0000 [IU]/kg/h | INTRAMUSCULAR | Status: DC
  Administered 2014-10-21 – 2014-10-22 (×2): 12 [IU]/kg/h via INTRAVENOUS
  Filled 2014-10-21 (×3): qty 250

## 2014-10-21 MED ORDER — ASPIRIN EC 81 MG PO TBEC
81.0000 mg | DELAYED_RELEASE_TABLET | Freq: Every day | ORAL | Status: DC
  Administered 2014-10-22 – 2014-10-30 (×9): 81 mg via ORAL
  Filled 2014-10-21 (×10): qty 1

## 2014-10-21 NOTE — ED Provider Notes (Addendum)
[facility] Emergency Department Provider Note     Time seen: ----------------------------------------- 7:14 PM on 10/15/2014 -----------------------------------------    I have reviewed the triage vital signs and the nursing notes.   HISTORY  Chief Complaint Fever; Cough; and Diarrhea    HPI [name] is a 81 y.o. male who presents to ER for fever, diarrhea and cough. Patient is not sure how long these things and then going on, states he may have had diarrhea for the last month. He has had some cough and congestion with yellow sputum production. He denies any pain at this time but states he's had abdominal pain earlier. Patient denies any other complaints.   Past Medical History  Diagnosis Date  . AAA (abdominal aortic aneurysm) 2005    Open AAA repair  . S/P CABG (coronary artery bypass graft) 2003    At [facility]; PCI 2004; no LHC since 2004  . Echocardiogram abnormal 11/10    Mild LVH, EF 55%, mild AI, diastolic dysfunction  . Hypertension     Unspecified  . Hyperlipidemia     Mixed  . History of orthostatic hypotension   . Carotid artery disease     Right basal ganglia CVA 11/10; CTA neck showed 90% RICA stenosis, 75-80% LICA stenosis  . Coronary artery disease     a. s/p CABG 1995 b. PCI in 2004  . Hx of bronchitis   . Pneumonia     HISTORY  . Gout   . History of AAA (abdominal aortic aneurysm) repair 07/09/2014    2008     Patient Active Problem List   Diagnosis Date Noted  . Need for immunization against influenza 10/11/2014  . Cannot sleep 10/11/2014  . Hyperlipidemia 10/11/2014  . BPH without obstruction/lower urinary tract symptoms 10/11/2014  . COPD (chronic obstructive pulmonary disease) with chronic bronchitis 08/09/2014  . Bronchiectasis 07/30/2014  . Constipation 07/30/2014  . Abnormal ECG 07/30/2014  . Absolute anemia 07/30/2014  . Atherosclerosis of coronary artery 07/30/2014  . Compromised kidney function 07/30/2014   . Dyslipidemia 07/30/2014  . Acute gouty arthritis 07/30/2014  . H/O acute myocardial infarction 07/30/2014  . H/O transient cerebral ischemia 07/30/2014  . Pulmonary alveolar hemorrhage 07/09/2014  . History of AAA (abdominal aortic aneurysm) repair 07/09/2014  . Chronic gout 07/09/2014  . Speech disturbance in adult 07/09/2014  . Atrial fibrillation 02/07/2014  . Tachycardia 10/25/2012  . Chronic systolic congestive heart failure 01/19/2011  . Aortic insufficiency 07/24/2010  . CAD, ARTERY BYPASS GRAFT 08/29/2009  . BRADYCARDIA, CHRONIC 08/29/2009  . HYPERLIPIDEMIA-MIXED 06/26/2008  . HYPERTENSION, UNSPECIFIED 06/26/2008  . CAROTID ARTERY DISEASE 06/26/2008  . PVD 06/26/2008  . Peripheral blood vessel disorder 06/26/2008    Past Surgical History  Procedure Laterality Date  . Coronary artery bypass graft  2003  . Abdominal aortic aneurysm repair  2005    Allergies Review of patient's allergies indicates no known allergies.  Social History Social History  Substance Use Topics  . Smoking status: Never Smoker   . Smokeless tobacco: Never Used  . Alcohol Use: No    Review of Systems Constitutional: Positive for fever Eyes: Negative for visual changes. ENT: Negative for sore throat. Cardiovascular: Negative for chest pain. Respiratory: Negative for shortness of breath. Positive for cough Gastrointestinal: Positive for abdominal pain and diarrhea  Genitourinary: Negative for dysuria. Musculoskeletal: Negative for back pain. Skin: Negative for rash. Neurological: Negative for headaches, focal weakness or numbness.  10-point ROS otherwise negative.  ____________________________________________  PHYSICAL EXAM:  VITAL SIGNS: ED Triage Vitals  Enc Vitals Group     BP --      Pulse --      Resp --      Temp --      Temp src --      SpO2 --      Weight --      Height --      Head Cir --      Peak Flow --      Pain Score --      Pain Loc --      Pain Edu?  --      Excl. in GC? --     Constitutional: Alert and oriented. Well appearing and in no distress. Eyes: Conjunctivae are normal. PERRL. Normal extraocular movements. ENT   Head: Normocephalic and atraumatic.   Nose: No congestion/rhinnorhea.   Mouth/Throat: Mucous membranes are moist.   Neck: No stridor. Cardiovascular: Rapid rate, irregular rhythm.. Normal and symmetric distal pulses are present in all extremities. No murmurs, rubs, or gallops. Respiratory: Normal respiratory effort without tachypnea nor retractions. Breath sounds are clear and equal bilaterally. No wheezes/rales/rhonchi. Gastrointestinal: Soft and nontender. No distention. No abdominal bruits. Hyperactive bowel sounds Musculoskeletal: Nontender with normal range of motion in all extremities. No joint effusions.  No lower extremity tenderness nor edema. Neurologic:  Normal speech and language. No gross focal neurologic deficits are appreciated. Speech is normal. No gait instability. Skin:  Skin is warm, dry and intact. No rash noted. Psychiatric: Mood and affect are normal. Speech and behavior are normal. Patient exhibits appropriate insight and judgment. ____________________________________________  EKG: Interpreted by me. Normal sinus rhythm with a rate of 92 bpm, first-degree AV block, left axis deviation, left bundle branch block.  ____________________________________________  ED COURSE:  Pertinent labs & imaging results that were available during my care of the patient were reviewed by me and considered in my medical decision making (see chart for details). Patient will be sepsis protocol, check labs, obtain cultures. ____________________________________________    LABS (pertinent positives/negatives)  Labs Reviewed  COMPREHENSIVE METABOLIC PANEL - Abnormal; Notable for the following:    Potassium 3.4 (*)    Chloride 100 (*)    CO2 20 (*)    Glucose, Bld 159 (*)    BUN 55 (*)    Creatinine,  Ser 3.02 (*)    Calcium 8.8 (*)    AST 81 (*)    ALT 14 (*)    Total Bilirubin 1.3 (*)    GFR calc non Af Amer 16 (*)    GFR calc Af Amer 18 (*)    All other components within normal limits  CBC WITH DIFFERENTIAL/PLATELET - Abnormal; Notable for the following:    RBC 3.91 (*)    Hemoglobin 12.4 (*)    HCT 36.8 (*)    Neutro Abs 7.3 (*)    All other components within normal limits  LACTIC ACID, PLASMA - Abnormal; Notable for the following:    Lactic Acid, Venous 2.3 (*)    All other components within normal limits  TROPONIN I - Abnormal; Notable for the following:    Troponin I 17.10 (*)    All other components within normal limits  BLOOD GAS, VENOUS - Abnormal; Notable for the following:    pCO2, Ven 40 (*)    Acid-base deficit 3.9 (*)    All other components within normal limits  PROTIME-INR - Abnormal; Notable for the following:  Prothrombin Time 16.9 (*)    All other components within normal limits  CULTURE, BLOOD (ROUTINE X 2)  CULTURE, BLOOD (ROUTINE X 2)  URINE CULTURE  APTT  LACTIC ACID, PLASMA  URINALYSIS COMPLETEWITH MICROSCOPIC ([facility] ONLY)  HEPARIN LEVEL (UNFRACTIONATED)   CRITICAL CARE Performed by: Emily Filbert   Total critical care time: 30 minutes Critical care time was exclusive of separately billable procedures and treating other patients.  Critical care was necessary to treat or prevent imminent or life-threatening deterioration.  Critical care was time spent personally by me on the following activities: development of treatment plan with patient and/or surrogate as well as nursing, discussions with consultants, evaluation of patient's response to treatment, examination of patient, obtaining history from patient or surrogate, ordering and performing treatments and interventions, ordering and review of laboratory studies, ordering and review of radiographic studies, pulse oximetry and re-evaluation of patient's condition.  RADIOLOGY  Chest  x-ray Is unremarkable ____________________________________________  FINAL ASSESSMENT AND PLAN  Systemic inflammatory response syndrome, elevated troponin, acute renal failure  Plan: Patient with labs and imaging as dictated above. Patient has been advised of abnormal lab findings. Unclear etiology of his fever at this time. Patient will be placed on heparin for his elevated troponin. Some of his troponin elevated is likely related to renal failure. He has received a liter of saline bolus for dehydration and acute renal failure.   Emily Filbert, MD   Emily Filbert, MD 10-25-14 2029  Emily Filbert, MD 2014-10-25 [phone]

## 2014-10-21 NOTE — ED Notes (Signed)
Pt comes into the ED via EMS with c/o fever, diarrhea, cough.  Patient presents with congestion and coughing up yellow sputum.  According to EMS, patient has had diarrhea for 1 month.  Denies chest pain, but complains of mild abdominal pain.  112/72, 96% room air.

## 2014-10-21 NOTE — H&P (Signed)
[facility] - [place] at [facility]   PATIENT NAME: Raiquan Chandler    MR#:  [id number]  DATE OF BIRTH:  09-08-1912  DATE OF ADMISSION:  10/19/2014  PRIMARY CARE PHYSICIAN: Brayton El, MD   REQUESTING/REFERRING PHYSICIAN: Dr. Daryel November  CHIEF COMPLAINT:   Chief Complaint  Patient presents with  . Fever  . Cough  . Diarrhea    HISTORY OF PRESENT ILLNESS:  Stratton Villwock  is a 81 y.o. male with a known history of history of coronary reactive disease status post bypass graft surgery and PCI, hypertension, AAA repair, chronic bronchitis not on home oxygen, congestive heart failure with systolic dysfunction, last known ejection fraction of 30% presents to the hospital secondary to worsening right foot big toe pain with redness, fevers, worsening diarrhea and cough. Patient denies any chest pain at this time. He says his diarrhea has been going on for almost a month now and hasn't gotten any medical attention for that. His chills and fever started just this morning. Abdomen is very bloated for the last couple of days. No blood noted in the stools. No recent travel or exposure to sick contacts. He has been seeing Dr. Mayo Ao for his chronic bronchitis. His symptoms have been worsened in the last couple of days. He has cough, sputum production and chills. Denies any dysuria or frequency of urination. Extremely weak since last night. Usually stays at home by himself and has a cane and walker for ambulation. Couldn't get out of bed yesterday, so his son stayed with him. All day today, he was having chills worsening cough and so presented to the ER. His noted to have a troponin of 17 here. Also acute on chronic renal failure. Temperature of 10 14F.  PAST MEDICAL HISTORY:   Past Medical History  Diagnosis Date  . AAA (abdominal aortic aneurysm) 2005    Open AAA repair  . S/P CABG (coronary artery bypass graft) 2003    At [facility]; PCI 2004; no LHC since 2004  .  Echocardiogram abnormal 11/10    Mild LVH, EF 55%, mild AI, diastolic dysfunction  . Hypertension     Unspecified  . Hyperlipidemia     Mixed  . History of orthostatic hypotension   . Carotid artery disease     Right basal ganglia CVA 11/10; CTA neck showed 90% RICA stenosis, 75-80% LICA stenosis  . Coronary artery disease     a. s/p CABG 1995 b. PCI in 2004  . Hx of bronchitis   . Pneumonia     HISTORY  . Gout   . History of AAA (abdominal aortic aneurysm) repair 07/09/2014    2008   . CKD (chronic kidney disease)     baseline creatinine of 1.8  . Gout     PAST SURGICAL HISTORY:   Past Surgical History  Procedure Laterality Date  . Coronary artery bypass graft  2003  . Abdominal aortic aneurysm repair  2005    SOCIAL HISTORY:   Social History  Substance Use Topics  . Smoking status: Never Smoker   . Smokeless tobacco: Never Used  . Alcohol Use: No    FAMILY HISTORY:   Family History  Problem Relation Age of Onset  . Family history unknown: Yes  Non contributory  DRUG ALLERGIES:  No Known Allergies  REVIEW OF SYSTEMS:   Review of Systems  Constitutional: Positive for fever, chills and malaise/fatigue. Negative for weight loss.  HENT: Positive for hearing loss and sore throat.  Negative for ear discharge, ear pain, nosebleeds and tinnitus.   Eyes: Negative for blurred vision, double vision and photophobia.  Respiratory: Positive for cough, sputum production and shortness of breath. Negative for hemoptysis and wheezing.   Cardiovascular: Positive for leg swelling. Negative for chest pain, palpitations and orthopnea.  Gastrointestinal: Positive for abdominal pain and diarrhea. Negative for heartburn, nausea, vomiting, constipation and melena.  Genitourinary: Negative for dysuria, urgency, frequency and hematuria.  Musculoskeletal: Positive for joint pain. Negative for myalgias, back pain and neck pain.       Right foot big toe pain and redness  Skin: Positive  for rash.  Neurological: Negative for dizziness, tingling, tremors, sensory change, speech change, focal weakness and headaches.  Endo/Heme/Allergies: Does not bruise/bleed easily.  Psychiatric/Behavioral: Negative for depression.    MEDICATIONS AT HOME:   Prior to Admission medications   Medication Sig Start Date End Date Taking? Authorizing Provider  albuterol (PROVENTIL HFA;VENTOLIN HFA) 108 (90 BASE) MCG/ACT inhaler Inhale 2 puffs into the lungs every 6 (six) hours as needed for wheezing or shortness of breath. 07/30/14   Ellyn Hack, MD  ALPRAZolam Prudy Feeler) 1 MG tablet Take 1 tablet (1 mg total) by mouth at bedtime as needed for anxiety. 10/11/14   Ellyn Hack, MD  amiodarone (PACERONE) 200 MG tablet Take one tablet by mouth once a week 08/02/14   Vesta Mixer, MD  aspirin 81 MG EC tablet Take 81 mg by mouth daily.      Historical Provider, MD  Earlie Server 100-25 MCG/INH AEPB  08/17/14   Historical Provider, MD  clopidogrel (PLAVIX) 75 MG tablet Take 1 tablet (75 mg total) by mouth daily. 09/24/14   Ellyn Hack, MD  docusate sodium (COLACE) 100 MG capsule Take by mouth.    Historical Provider, MD  finasteride (PROSCAR) 5 MG tablet Take 1 tablet (5 mg total) by mouth daily. 10/11/14   Ellyn Hack, MD  HYDROcodone-homatropine [facility]) 5-1.5 MG/5ML syrup Take 5 mLs by mouth every 6 (six) hours as needed for cough.    Historical Provider, MD  Multiple Vitamin (MULTIVITAMIN) tablet Take 1 tablet by mouth daily.    Historical Provider, MD  potassium chloride 20 MEQ TBCR Take 10 mEq by mouth daily. 08/29/13   Vesta Mixer, MD  pravastatin (PRAVACHOL) 40 MG tablet Take 1 tablet (40 mg total) by mouth daily. 10/11/14   Ellyn Hack, MD  predniSONE (DELTASONE) 5 MG tablet Take 5 mg by mouth daily with breakfast.    Historical Provider, MD  timolol (TIMOPTIC) 0.5 % ophthalmic solution 1 drop daily.    Historical Provider, MD  torsemide (DEMADEX) 20 MG tablet Take 1 tablet (20 mg  total) by mouth daily. 08/14/14   Vesta Mixer, MD      VITAL SIGNS:  Blood pressure 108/71, pulse 130, temperature 101.4 F (38.6 C), temperature source Oral, resp. rate 20, height  (1.676 m), weight 75.751 kg (167 lb), SpO2 91 %.  PHYSICAL EXAMINATION:   Physical Exam  GENERAL:  81 y.o.-year-old patient lying in the bed with no acute distress.  EYES: Pupils equal, round, reactive to light and accommodation. No scleral icterus. Extraocular muscles intact.  HEENT: Head atraumatic, normocephalic. Oropharynx and nasopharynx clear.  NECK:  Supple, no jugular venous distention. No thyroid enlargement, no tenderness.  LUNGS: Coarse rhonchi heard bilaterally over both lungs more prominent posteriorly. Moving air bilaterally. No bruits heard on exam. No crackles heard. No  use of accessory muscles of respiration.  CARDIOVASCULAR: S1, S2 normal. No  rubs, or gallops. 3/6 systolic murmur present ABDOMEN: Soft, nontender, appears distended. Bowel sounds present. No organomegaly or mass.  EXTREMITIES: No cyanosis, or clubbing. No pedal edema noted on the left foot. However the right foot big toe is erythematous and tender to touch with some erythema and swelling spreading onto the dorsal side of the foot NEUROLOGIC: Cranial nerves II through XII are intact. Muscle strength 5/5 in all extremities. Sensation intact. Gait not checked. No focal neurological deficits PSYCHIATRIC: The patient is alert and oriented x 3.  SKIN: No obvious rash, lesion, or ulcer. Other than the erythema on the right foot, no rash noted  LABORATORY PANEL:   CBC  Recent Labs Lab 11/01/2014 1921  WBC 9.5  HGB 12.4*  HCT 36.8*  PLT 165   ------------------------------------------------------------------------------------------------------------------  Chemistries   Recent Labs Lab 10/11/2014 1921  NA 135  K 3.4*  CL 100*  CO2 20*  GLUCOSE 159*  BUN 55*  CREATININE 3.02*  CALCIUM 8.8*  AST 81*  ALT 14*   ALKPHOS 84  BILITOT 1.3*   ------------------------------------------------------------------------------------------------------------------  Cardiac Enzymes  Recent Labs Lab 10/12/2014 1921  TROPONINI 17.10*   ------------------------------------------------------------------------------------------------------------------  RADIOLOGY:  Dg Chest Port 1 View  10/13/2014   CLINICAL DATA:  Patient has afever, diarrhea, cough. Patient presents with congestion and coughing up yellow sputum. Hx htn, aaa, cabg, cad, bronchitis, pneumonia  EXAM: PORTABLE CHEST - 1 VIEW  COMPARISON:  07/30/2014  FINDINGS: Stable changes from previous CABG surgery. Cardiac silhouette is top-normal size. Aorta is uncoiled. No mediastinal or hilar masses or convincing adenopathy.  Lungs are clear.  No pleural effusion or pneumothorax.  Bony thorax is demineralized grossly intact.  IMPRESSION: No acute cardiopulmonary disease.   Electronically Signed   By: Amie Portland M.D.   On: 10/23/2014 20:15    EKG:   Orders placed or performed during the hospital encounter of 11/01/2014  . ED EKG 12-Lead  . ED EKG 12-Lead  . EKG 12-Lead  . EKG 12-Lead    IMPRESSION AND PLAN:   Demetre Monaco  is a 81 y.o. male with a known history of history of coronary reactive disease status post bypass graft surgery and PCI, hypertension, AAA repair, chronic bronchitis not on home oxygen, congestive heart failure with systolic dysfunction, last known ejection fraction of 30% presents to the hospital secondary to worsening right foot big toe pain with redness, fevers, worsening diarrhea and cough.  #1 non-ST segment elevation MI-patient is asymptomatic, will hold off on Nitropaste -Significant known cardiac history -Admit to telemetry, cardiology consult with [facility] cardiology -IV heparin drip started, recycle troponins -Blood pressure is on the lower side, so not on beta blockers. Continue statin -Echocardiogram ordered. Known  history of severe aortic stenosis  #2 acute gouty attack-on low-dose prednisone at home, we'll change to 60 mg daily at this time. -Colchicine daily  #3 sepsis-blood cultures, sputum cultures and urine cultures ordered at this time. -Chest x-ray with no significant infiltrate, but has respiratory symptoms with productive cough. Not sure if his bronchitis is worsening -We'll cover with broad-spectrum antibiotics for now pending cultures -Could be from his acute gout as well  #4 acute renal failure on CKD-likely prerenal and ATN -Renal ultrasound ordered. Gentle hydration for a day. If no improvement, then can call nephrology consult. -Avoid nephrotoxins. -Baseline creatinine is around 1.8  #5 subacute diarrhea-going on for almost a month. -No recent use of  antibiotics. Stool cultures and stool for C. difficile ordered -Gastroenterology consult  #6 hypertension-hypotensive in the hospital. Not on any blood pressure medications. -Monitor at this time  #7 chronic systolic CHF-not fluid overloaded. -Due to his diarrhea, volume depleted. With acute renal failure we'll hold his torsemide. -Gentle hydration and follow up -Repeat echocardiogram  #8 DVT prophylaxis-on heparin drip   All the records are reviewed and case discussed with ED provider. Management plans discussed with the patient, family and they are in agreement.  CODE STATUS: Full code  TOTAL TIME TAKING CARE OF THIS PATIENT: 60 minutes.    Enid Baas M.D on Nov 01, 2014 at 9:15 PM  Between 7am to 6pm - Pager - [phone]  After 6pm go to [email or web address] - password [email or web address] [facility]  [place] [facility]  Office  [phone]  CC: Primary care physician; Brayton El, MD

## 2014-10-21 NOTE — Progress Notes (Signed)
ANTICOAGULATION CONSULT NOTE - Initial Consult  Pharmacy Consult for heparin gtt dosing and monitoring Indication: chest pain/ACS  No Known Allergies  Patient Measurements: Height:  (167.6 cm) Weight: 167 lb (75.751 kg) IBW/kg (Calculated) : 63.8 Heparin Dosing Weight: 75.8 kg  Vital Signs: Temp: 101.4 F (38.6 C) (09/18 1911) Temp Source: Oral (09/18 1911) BP: 108/71 mmHg (09/18 1911) Pulse Rate: 130 (09/18 1911)  Labs:  Recent Labs  November 03, 2014 1921  HGB 12.4*  HCT 36.8*  PLT 165  APTT 35  LABPROT 16.9*  INR 1.35  CREATININE 3.02*  TROPONINI 17.10*    Estimated Creatinine Clearance: 12 mL/min (by C-G formula based on Cr of 3.02).   Medical History: Past Medical History  Diagnosis Date  . AAA (abdominal aortic aneurysm) 2005    Open AAA repair  . S/P CABG (coronary artery bypass graft) 2003    At [facility]; PCI 2004; no LHC since 2004  . Echocardiogram abnormal 11/10    Mild LVH, EF 55%, mild AI, diastolic dysfunction  . Hypertension     Unspecified  . Hyperlipidemia     Mixed  . History of orthostatic hypotension   . Carotid artery disease     Right basal ganglia CVA 11/10; CTA neck showed 90% RICA stenosis, 75-80% LICA stenosis  . Coronary artery disease     a. s/p CABG 1995 b. PCI in 2004  . Hx of bronchitis   . Pneumonia     HISTORY  . Gout   . History of AAA (abdominal aortic aneurysm) repair 07/09/2014    2008   . CKD (chronic kidney disease)     baseline creatinine of 1.8  . Gout     Assessment: Pharmacy consulted to dose heparin gtt for chest pain/ACS. Patient was not taking anticoagulants prior to admission.   Goal of Therapy:  Heparin level 0.3-0.7 units/ml Monitor platelets by anticoagulation protocol: Yes   Plan:  Give 4000 units bolus x 1 Start heparin infusion at 900 units/hr Check anti-Xa level in 8 hours and daily while on heparin Continue to monitor H&H and platelets  [name] 11-03-14,10:14 PM

## 2014-10-22 ENCOUNTER — Inpatient Hospital Stay (HOSPITAL_COMMUNITY)
Admit: 2014-10-22 | Discharge: 2014-10-22 | Disposition: A | Discharge status: Home / Self Care | Payer: Medicare Other | Attending: Internal Medicine | Admitting: Internal Medicine

## 2014-10-22 ENCOUNTER — Inpatient Hospital Stay: Payer: Medicare Other

## 2014-10-22 DIAGNOSIS — R197 Diarrhea, unspecified: Secondary | ICD-10-CM | POA: Insufficient documentation

## 2014-10-22 DIAGNOSIS — I340 Nonrheumatic mitral (valve) insufficiency: Secondary | ICD-10-CM

## 2014-10-22 DIAGNOSIS — I214 Non-ST elevation (NSTEMI) myocardial infarction: Principal | ICD-10-CM

## 2014-10-22 LAB — EXPECTORATED SPUTUM ASSESSMENT W REFEX TO RESP CULTURE

## 2014-10-22 LAB — BASIC METABOLIC PANEL
Anion gap: 10 (ref 5–15)
BUN: 56 mg/dL — AB (ref 6–20)
CALCIUM: 8 mg/dL — AB (ref 8.9–10.3)
CHLORIDE: 104 mmol/L (ref 101–111)
CO2: 22 mmol/L (ref 22–32)
CREATININE: 2.91 mg/dL — AB (ref 0.61–1.24)
GFR calc non Af Amer: 16 mL/min — ABNORMAL LOW (ref >60)
GFR, EST AFRICAN AMERICAN: 19 mL/min — AB (ref >60)
GLUCOSE: 133 mg/dL — AB (ref 65–99)
Potassium: 3.3 mmol/L — ABNORMAL LOW (ref 3.5–5.1)
Sodium: 136 mmol/L (ref 135–145)

## 2014-10-22 LAB — URINALYSIS COMPLETE WITH MICROSCOPIC (ARMC ONLY)
BILIRUBIN URINE: NEGATIVE
GLUCOSE, UA: NEGATIVE mg/dL
Hgb urine dipstick: NEGATIVE
KETONES UR: NEGATIVE mg/dL
LEUKOCYTES UA: NEGATIVE
NITRITE: NEGATIVE
PROTEIN: NEGATIVE mg/dL
SPECIFIC GRAVITY, URINE: 1.014 (ref 1.005–1.030)
pH: 5 (ref 5.0–8.0)

## 2014-10-22 LAB — CBC
HCT: 30.7 % — ABNORMAL LOW (ref 40.0–52.0)
Hemoglobin: 10.4 g/dL — ABNORMAL LOW (ref 13.0–18.0)
MCH: 32 pg (ref 26.0–34.0)
MCHC: 33.9 g/dL (ref 32.0–36.0)
MCV: 94.4 fL (ref 80.0–100.0)
PLATELETS: 130 10*3/uL — AB (ref 150–440)
RBC: 3.25 MIL/uL — AB (ref 4.40–5.90)
RDW: 14.3 % (ref 11.5–14.5)
WBC: 7.7 10*3/uL (ref 3.8–10.6)

## 2014-10-22 LAB — C DIFFICILE QUICK SCREEN W PCR REFLEX
C DIFFICILE (CDIFF) INTERP: NEGATIVE
C DIFFICILE (CDIFF) TOXIN: NEGATIVE
C Diff antigen: NEGATIVE

## 2014-10-22 LAB — HEMOGLOBIN A1C: Hgb A1c MFr Bld: 5.6 % (ref 4.0–6.0)

## 2014-10-22 LAB — WBCS, STOOL

## 2014-10-22 LAB — EXPECTORATED SPUTUM ASSESSMENT W GRAM STAIN, RFLX TO RESP C: Special Requests: NORMAL

## 2014-10-22 LAB — TROPONIN I
TROPONIN I: 19.39 ng/mL — AB (ref <0.031)
Troponin I: 17.78 ng/mL — ABNORMAL HIGH (ref <0.031)

## 2014-10-22 LAB — HEPARIN LEVEL (UNFRACTIONATED)
HEPARIN UNFRACTIONATED: 0.31 [IU]/mL (ref 0.30–0.70)
Heparin Unfractionated: 0.33 IU/mL (ref 0.30–0.70)

## 2014-10-22 LAB — LACTIC ACID, PLASMA: Lactic Acid, Venous: 1.6 mmol/L (ref 0.5–2.0)

## 2014-10-22 MED ORDER — VANCOMYCIN HCL IN DEXTROSE 1-5 GM/200ML-% IV SOLN
1000.0000 mg | INTRAVENOUS | Status: DC
  Administered 2014-10-23: 1000 mg via INTRAVENOUS
  Filled 2014-10-22: qty 200

## 2014-10-22 MED ORDER — ACETYLCYSTEINE 20 % IN SOLN
2.0000 mL | Freq: Two times a day (BID) | RESPIRATORY_TRACT | Status: DC
Start: 2014-10-22 — End: 2014-10-24
  Administered 2014-10-23: 2 mL via RESPIRATORY_TRACT
  Filled 2014-10-22 (×2): qty 4

## 2014-10-22 MED ORDER — CETYLPYRIDINIUM CHLORIDE 0.05 % MT LIQD
7.0000 mL | Freq: Two times a day (BID) | OROMUCOSAL | Status: DC
  Administered 2014-10-22 (×2): 7 mL via OROMUCOSAL

## 2014-10-22 MED ORDER — CHLORHEXIDINE GLUCONATE 0.12 % MT SOLN
15.0000 mL | Freq: Two times a day (BID) | OROMUCOSAL | Status: DC
  Administered 2014-10-23: 15 mL via OROMUCOSAL
  Filled 2014-10-22: qty 15

## 2014-10-22 MED ORDER — VANCOMYCIN HCL IN DEXTROSE 1-5 GM/200ML-% IV SOLN
1000.0000 mg | Freq: Once | INTRAVENOUS | Status: AC
  Administered 2014-10-22: 1000 mg via INTRAVENOUS
  Filled 2014-10-22: qty 200

## 2014-10-22 NOTE — Progress Notes (Signed)
ANTICOAGULATION CONSULT NOTE - Initial Consult  Pharmacy Consult for heparin gtt dosing and monitoring Indication: chest pain/ACS  No Known Allergies  Patient Measurements: Height:  (167.6 cm) Weight: 167 lb (75.751 kg) IBW/kg (Calculated) : 63.8 Heparin Dosing Weight: 75.8 kg  Vital Signs: Temp: 97.5 F (36.4 C) (09/19 1111) Temp Source: Oral (09/19 1111) BP: 90/63 mmHg (09/19 1118) Pulse Rate: 63 (09/19 1118)  Labs:  Recent Labs  10/30/2014 1921 10/22/2014 2348 10/22/14 0654 10/22/14 1453  HGB 12.4*  --  10.4*  --   HCT 36.8*  --  30.7*  --   PLT 165  --  130*  --   APTT 35  --   --   --   LABPROT 16.9*  --   --   --   INR 1.35  --   --   --   HEPARINUNFRC  --   --  0.33 0.31  CREATININE 3.02*  --  2.91*  --   TROPONINI 17.10* 19.39* 17.78*  --     Estimated Creatinine Clearance: 12.5 mL/min (by C-G formula based on Cr of 2.91).   Medical History: Past Medical History  Diagnosis Date  . AAA (abdominal aortic aneurysm) 2005    Open AAA repair  . S/P CABG (coronary artery bypass graft) 2003    At [facility]; PCI 2004; no LHC since 2004  . Echocardiogram abnormal 11/10    Mild LVH, EF 55%, mild AI, diastolic dysfunction  . Hypertension     Unspecified  . Hyperlipidemia     Mixed  . History of orthostatic hypotension   . Carotid artery disease     Right basal ganglia CVA 11/10; CTA neck showed 90% RICA stenosis, 75-80% LICA stenosis  . Coronary artery disease     a. s/p CABG 1995 b. PCI in 2004  . Hx of bronchitis   . Pneumonia     HISTORY  . Gout   . History of AAA (abdominal aortic aneurysm) repair 07/09/2014    2008   . CKD (chronic kidney disease)     baseline creatinine of 1.8  . Gout     Assessment: Pharmacy consulted to dose heparin gtt for chest pain/ACS. Patient was not taking anticoagulants prior to admission.   Goal of Therapy:  Heparin level 0.3-0.7 units/ml Monitor platelets by anticoagulation protocol: Yes   Plan:  Give 4000 units  bolus x 1 Start heparin infusion at 900 units/hr Check anti-Xa level in 8 hours and daily while on heparin Continue to monitor H&H and platelets   9/19:  HL @ 7:00 = 0.33           HL @ 15:00 = 0.31  Will continue this pt on current rate of 900 units/hr and recheck HL on 9/20 with AM labs.   Robbins,Jason D 10/22/2014,4:05 PM

## 2014-10-22 NOTE — Progress Notes (Signed)
Patient bp 90/63 MD made aware , will  Review meds

## 2014-10-22 NOTE — Progress Notes (Signed)
[facility] -  at [facility]   PATIENT NAME: [name]    MR#:  [id number]  DATE OF BIRTH:  09/04/1912  SUBJECTIVE:  CHIEF COMPLAINT:   Chief Complaint  Patient presents with  . Fever  . Cough  . Diarrhea     Feels much better today, never had any chest pain. Diarrhea is still there.  REVIEW OF SYSTEMS:  CONSTITUTIONAL: No fever, fatigue or weakness.  EYES: No blurred or double vision.  EARS, NOSE, AND THROAT: No tinnitus or ear pain.  RESPIRATORY: No cough, shortness of breath, wheezing or hemoptysis.  CARDIOVASCULAR: No chest pain, orthopnea, edema.  GASTROINTESTINAL: No nausea, vomiting, positive for diarrhea , no abdominal pain.  GENITOURINARY: No dysuria, hematuria.  ENDOCRINE: No polyuria, nocturia,  HEMATOLOGY: No anemia, easy bruising or bleeding SKIN: No rash or lesion. MUSCULOSKELETAL: right great toe joint pain or arthritis.   NEUROLOGIC: No tingling, numbness, had generalized weakness.  PSYCHIATRY: No anxiety or depression.   ROS  DRUG ALLERGIES:  No Known Allergies  VITALS:  Blood pressure 90/63, pulse 63, temperature 97.5 F (36.4 C), temperature source Oral, resp. rate 18, height  (1.676 m), weight 75.751 kg (167 lb), SpO2 100 %.  PHYSICAL EXAMINATION:   GENERAL: 81 y.o.-year-old patient lying in the bed with no acute distress.  EYES: Pupils equal, round, reactive to light and accommodation. No scleral icterus. Extraocular muscles intact.  HEENT: Head atraumatic, normocephalic. Oropharynx and nasopharynx clear.  NECK: Supple, no jugular venous distention. No thyroid enlargement, no tenderness.  LUNGS: Coarse rhonchi heard bilaterally over both lungs more prominent posteriorly. Moving air bilaterally. No bruits heard on exam. No crackles heard. No use of accessory muscles of respiration.  CARDIOVASCULAR: S1, S2 normal. No rubs, or gallops. 3/6 systolic murmur present ABDOMEN: Soft, nontender, appears  distended. Bowel sounds present. No organomegaly or mass.  EXTREMITIES: No cyanosis, or clubbing. No pedal edema noted on the left foot. However the right foot big toe is erythematous and tender to touch with some erythema and swelling spreading onto the dorsal side of the foot NEUROLOGIC: Cranial nerves II through XII are intact. Muscle strength 5/5 in all extremities. Sensation intact. Gait not checked. No focal neurological deficits PSYCHIATRIC: The patient is alert and oriented x 3.  SKIN: No obvious rash, lesion, or ulcer. Other than the erythema on the right foot, no rash noted  Physical Exam LABORATORY PANEL:   CBC  Recent Labs Lab 10/22/14 0654  WBC 7.7  HGB 10.4*  HCT 30.7*  PLT 130*   ------------------------------------------------------------------------------------------------------------------  Chemistries   Recent Labs Lab 10/09/2014 1921 10/22/14 0654  NA 135 136  K 3.4* 3.3*  CL 100* 104  CO2 20* 22  GLUCOSE 159* 133*  BUN 55* 56*  CREATININE 3.02* 2.91*  CALCIUM 8.8* 8.0*  AST 81*  --   ALT 14*  --   ALKPHOS 84  --   BILITOT 1.3*  --    ------------------------------------------------------------------------------------------------------------------  Cardiac Enzymes  Recent Labs Lab 10/15/2014 2348 10/22/14 0654  TROPONINI 19.39* 17.78*   ------------------------------------------------------------------------------------------------------------------  RADIOLOGY:  US Renal  10/22/2014   CLINICAL DATA:  Acute renal failure  EXAM: RENAL / URINARY TRACT ULTRASOUND COMPLETE  COMPARISON:  CT abdomen 12/27/2003  FINDINGS: Right Kidney:  Length: 13.2 cm. Increased renal cortical echogenicity. 6.3 x 6.5 x 7.6 cm anechoic right lower pole renal mass with increased through transmission most consistent with a cyst. 3.6 x 5.1 x 2.1 cm anechoic right mid pole  renal mass with increased through transmission most consistent with a cyst. 3.9 x 5.4 x 5.5 cm  hypoechoic right upper pole renal mass with debris layering dependently without internal Doppler flow most consistent with a mildly complicated cyst. No solid mass or hydronephrosis visualized.  Left Kidney:  Length: 9.3 cm. Echogenicity within normal limits. No mass or hydronephrosis visualized.  Bladder:  Appears normal for degree of bladder distention.  IMPRESSION: 1. No obstructive uropathy. 2. Mildly complicated cyst in the upper pole of the right kidney. Follow-up renal ultrasound in 6 months recommended. 3. Multiple right renal simple cysts.   Electronically Signed   By: Elige Ko   On: 10/22/2014 09:46   Dg Chest Port 1 View  October 24, 2014   CLINICAL DATA:  Patient has afever, diarrhea, cough. Patient presents with congestion and coughing up yellow sputum. Hx htn, aaa, cabg, cad, bronchitis, pneumonia  EXAM: PORTABLE CHEST - 1 VIEW  COMPARISON:  07/30/2014  FINDINGS: Stable changes from previous CABG surgery. Cardiac silhouette is top-normal size. Aorta is uncoiled. No mediastinal or hilar masses or convincing adenopathy.  Lungs are clear.  No pleural effusion or pneumothorax.  Bony thorax is demineralized grossly intact.  IMPRESSION: No acute cardiopulmonary disease.   Electronically Signed   By: Amie Portland M.D.   On: 2014-10-24 20:15    ASSESSMENT AND PLAN:   [name] is a 81 y.o. male with a known history of history of coronary reactive disease status post bypass graft surgery and PCI, hypertension, AAA repair, chronic bronchitis not on home oxygen, congestive heart failure with systolic dysfunction, last known ejection fraction of 30% presents to the hospital secondary to worsening right foot big toe pain with redness, fevers, worsening diarrhea and cough.  #1 non-ST segment elevation MI-patient is asymptomatic,  -Significant known cardiac history -Admit to telemetry, cardiology consult appreciated with [facility] cardiology -IV heparin drip started, recycle troponins -Blood pressure  is on the lower side, so not on beta blockers. Continue statin -Echocardiogram done. Known history of severe aortic stenosis - no aggressive work ups due to old age.  #2 acute gouty attack-on low-dose prednisone at home, we'll change to 60 mg daily at this time. -Colchicine daily  #3 sepsis-blood cultures, sputum cultures and urine cultures ordered at this time. -Chest x-ray with no significant infiltrate, but has respiratory symptoms with productive cough.   likely bronchitis -We'll cover with broad-spectrum antibiotics for now pending cultures -Could be from his acute gout as well  #4 acute renal failure on CKD-likely prerenal and ATN -Renal ultrasound without significant findings. Gentle hydration for a day. - nephrology consult. -Avoid nephrotoxins. -Baseline creatinine is around 1.8  #5 subacute diarrhea-going on for almost a month. -No recent use of antibiotics. Stool cultures and stool for C. difficile ordered -Gastroenterology consult  #6 hypertension-hypotensive in the hospital. Not on any blood pressure medications. -Monitor at this time  #7 chronic systolic CHF-not fluid overloaded. -Due to his diarrhea, volume depleted. With acute renal failure we'll hold his torsemide. -Gentle hydration and follow up -Repeat echocardiogram  #8 DVT prophylaxis-on heparin drip   All the records are reviewed and case discussed with Care Management/Social Workerr. Management plans discussed with the patient, family and they are in agreement.  CODE STATUS: Yes for CPR- No intubation or ventilator.  TOTAL TIME TAKING CARE OF THIS PATIENT: 35 minutes.     POSSIBLE D/C IN 1-2 DAYS, DEPENDING ON CLINICAL CONDITION.   Altamese Dilling M.D on 10/22/2014   Between 7am to  6pm - Pager - [phone]  After 6pm go to [email or web address] - password [email or web address] [facility]  [place] [facility]  Office  [phone]  CC: Primary care physician; Brayton El, MD

## 2014-10-22 NOTE — Progress Notes (Signed)
ANTIBIOTIC CONSULT NOTE - INITIAL  Pharmacy Consult for vancomycin/Zosyn Indication: rule out sepsis  No Known Allergies  Patient Measurements: Height:  (167.6 cm) Weight: 167 lb (75.751 kg) IBW/kg (Calculated) : 63.8 Adjusted Body Weight: 68.5 kg  Vital Signs: Temp: 98.2 F (36.8 C) (09/18 2315) Temp Source: Oral (09/18 2315) BP: 105/63 mmHg (09/18 2315) Pulse Rate: 78 (09/18 2315) Intake/Output from previous day:   Intake/Output from this shift:    Labs:  Recent Labs  10/31/2014 1921  WBC 9.5  HGB 12.4*  PLT 165  CREATININE 3.02*   Estimated Creatinine Clearance: 12 mL/min (by C-G formula based on Cr of 3.02). No results for input(s): VANCOTROUGH, VANCOPEAK, VANCORANDOM, GENTTROUGH, GENTPEAK, GENTRANDOM, TOBRATROUGH, TOBRAPEAK, TOBRARND, AMIKACINPEAK, AMIKACINTROU, AMIKACIN in the last 72 hours.   Microbiology: No results found for this or any previous visit (from the past 720 hour(s)).  Medical History: Past Medical History  Diagnosis Date  . AAA (abdominal aortic aneurysm) 2005    Open AAA repair  . S/P CABG (coronary artery bypass graft) 2003    At [facility]; PCI 2004; no LHC since 2004  . Echocardiogram abnormal 11/10    Mild LVH, EF 55%, mild AI, diastolic dysfunction  . Hypertension     Unspecified  . Hyperlipidemia     Mixed  . History of orthostatic hypotension   . Carotid artery disease     Right basal ganglia CVA 11/10; CTA neck showed 90% RICA stenosis, 75-80% LICA stenosis  . Coronary artery disease     a. s/p CABG 1995 b. PCI in 2004  . Hx of bronchitis   . Pneumonia     HISTORY  . Gout   . History of AAA (abdominal aortic aneurysm) repair 07/09/2014    2008   . CKD (chronic kidney disease)     baseline creatinine of 1.8  . Gout     Medications:  Infusions:  . sodium chloride 60 mL/hr at 10/09/2014 2237  . heparin 12 Units/kg/hr (10/30/2014 2236)   Assessment: 89 yom cc fever/cough/diarrhea x 1 month but not medical attention. Worsening  acute bronchitis over several days. Cough, sputum and chills. ED temperature 101. Acute on chronic renal failure, no note of dialysis.  Vd 48 L, Ke 0.015 hr-1, T1/2 45.8 hr, predicted trough 19 mcg/mL likely lower if renal function improves  Goal of Therapy:  Vancomycin trough level 15-20 mcg/ml  Plan:  Expected duration 7 days with resolution of temperature and/or normalization of WBC. Zosyn 3.375 gm IV Q12H EI and vancomycin 1 gm IV Q48H with stacked dosing will continue to follow for renal function improvement and maintain trough 15 to 20 mcg/mL.  Carola Frost, Pharm.D. Clinical Pharmacist 10/22/2014,12:05 AM

## 2014-10-22 NOTE — Consult Note (Signed)
[facility] CONSULT NOTE    Date: 10/22/2014                  Patient Name:  [name]  MRN: [id number]  DOB: 12/13/1912  Age / Sex: 81 y.o., male         PCP: Keith Rake, MD                 Service Requesting Consult: Dr. Tressia Miners                 Reason for Consult: Acute renal failure            History of Present Illness: Patient is a 81 y.o. male with a PMHx of abdominal aortic aneurysm status post open repair 2005, status post CABG with PCI in 9476, chronic systolic heart failure ejection fraction 20-25%, hypertension, hyperlipidemia, right carotid artery disease, gout, chronic kidney disease stage III Baseline creatinine 1.5 with an EGFR 36 who presented to [facility] with fevers, chills, cough. On initial evaluation he was found to have pneumonia, non-ST elevation MI, acute renal failure in the setting of known chronic kidney disease stage III. As above the patient's baseline creatinine is 1.5 with an EGFR of 36. Upon presentation creatinine was high at 3.02. It has drifted down slightly to 2.9 today. He is on IV fluid hydration with 0.9 normal saline. Apparently he had poor by mouth intake while at home as well as diarrhea. He was also found to be on torsemide at home. He is currently awake and alert and able to offer all history. He apparently lives by himself.  Medications: Outpatient medications: Prescriptions prior to admission  Medication Sig Dispense Refill Last Dose  . amiodarone (PACERONE) 200 MG tablet Take one tablet by mouth once a week 30 tablet 6 Past Week at Unknown time  . Melatonin 10 MG TABS Take 1 tablet by mouth at bedtime as needed.   PRN at PRN  . albuterol (PROVENTIL HFA;VENTOLIN HFA) 108 (90 BASE) MCG/ACT inhaler Inhale 2 puffs into the lungs every 6 (six) hours as needed for wheezing or shortness of breath. 1 Inhaler 2 PRN at PRN  . ALPRAZolam (XANAX) 1 MG tablet Take 1 tablet (1 mg total) by mouth at  bedtime as needed for anxiety. 90 tablet 0 PRN at PRN  . BREO ELLIPTA 100-25 MCG/INH AEPB Take 1 puff by mouth daily.   0 unknown at unknown  . clopidogrel (PLAVIX) 75 MG tablet Take 1 tablet (75 mg total) by mouth daily. 90 tablet 0 unknown at unknown  . docusate sodium (COLACE) 100 MG capsule Take 100 mg by mouth daily as needed.    PRN at PRN  . finasteride (PROSCAR) 5 MG tablet Take 1 tablet (5 mg total) by mouth daily. 90 tablet 0 unknown at unknown  . Multiple Vitamin (MULTIVITAMIN) tablet Take 1 tablet by mouth daily.   unknown at unknown  . potassium chloride 20 MEQ TBCR Take 10 mEq by mouth daily. 60 tablet 3 uknown at unknown  . pravastatin (PRAVACHOL) 40 MG tablet Take 1 tablet (40 mg total) by mouth daily. 90 tablet 0 unknown at unknown  . timolol (TIMOPTIC) 0.5 % ophthalmic solution Place 1 drop into both eyes at bedtime.    unknown at unknown  . torsemide (DEMADEX) 20 MG tablet Take 1 tablet (20 mg total) by mouth daily. (Patient taking differently: Take 20 mg by mouth daily. Do not take more than 1  tablet daily for more than 3 days in a row) 90 tablet 3 unknown at unknown    Current medications: Current Facility-Administered Medications  Medication Dose Route Frequency Provider Last Rate Last Dose  . 0.9 %  sodium chloride infusion   Intravenous Continuous Gladstone Lighter, MD 60 mL/hr at 10/18/2014 2237    . acetaminophen (TYLENOL) tablet 650 mg  650 mg Oral Q6H PRN Gladstone Lighter, MD       Or  . acetaminophen (TYLENOL) suppository 650 mg  650 mg Rectal Q6H PRN Gladstone Lighter, MD      . acetylcysteine (MUCOMYST) 20 % nebulizer / oral solution 2 mL  2 mL Nebulization BID Vaughan Basta, MD      . albuterol (PROVENTIL) (2.5 MG/3ML) 0.083% nebulizer solution 3 mL  3 mL Inhalation Q6H PRN Gladstone Lighter, MD      . ALPRAZolam Duanne Moron) tablet 1 mg  1 mg Oral QHS PRN Gladstone Lighter, MD      . amiodarone (PACERONE) tablet 200 mg  200 mg Oral Weekly Gladstone Lighter, MD    200 mg at 10/22/14 0956  . antiseptic oral rinse (CPC / CETYLPYRIDINIUM CHLORIDE 0.05%) solution 7 mL  7 mL Mouth Rinse q12n4p Tawni Millers, RN   7 mL at 10/22/14 1200  . aspirin EC tablet 81 mg  81 mg Oral Daily Gladstone Lighter, MD   81 mg at 10/22/14 0956  . chlorhexidine (PERIDEX) 0.12 % solution 15 mL  15 mL Mouth Rinse BID Tawni Millers, RN   15 mL at 10/22/14 0042  . clopidogrel (PLAVIX) tablet 75 mg  75 mg Oral Daily Gladstone Lighter, MD   75 mg at 10/22/14 0956  . colchicine tablet 0.6 mg  0.6 mg Oral Daily Gladstone Lighter, MD   0.6 mg at 10/22/14 0956  . finasteride (PROSCAR) tablet 5 mg  5 mg Oral Daily Gladstone Lighter, MD   5 mg at 10/22/14 0956  . heparin ADULT infusion 100 units/mL (25000 units/250 mL)  12 Units/kg/hr (Order-Specific) Intravenous Continuous Lenis Noon, RPH 9 mL/hr at 10/20/2014 2236 12 Units/kg/hr at 10/20/2014 2236  . HYDROcodone-homatropine (HYCODAN) 5-1.5 MG/5ML syrup 5 mL  5 mL Oral Q6H PRN Gladstone Lighter, MD      . ipratropium-albuterol (DUONEB) 0.5-2.5 (3) MG/3ML nebulizer solution 3 mL  3 mL Nebulization Q4H Gladstone Lighter, MD   3 mL at 10/22/14 1139  . mometasone-formoterol (DULERA) 100-5 MCG/ACT inhaler 2 puff  2 puff Inhalation BID Gladstone Lighter, MD   2 puff at 10/22/14 0825  . multivitamin with minerals tablet 1 tablet  1 tablet Oral Daily Gladstone Lighter, MD   1 tablet at 10/22/14 0956  . piperacillin-tazobactam (ZOSYN) IVPB 3.375 g  3.375 g Intravenous Q12H Gladstone Lighter, MD   3.375 g at 10/22/14 1155  . pravastatin (PRAVACHOL) tablet 40 mg  40 mg Oral Daily Gladstone Lighter, MD   40 mg at 10/22/14 0956  . predniSONE (DELTASONE) tablet 60 mg  60 mg Oral Q breakfast Gladstone Lighter, MD   60 mg at 10/22/14 0826  . sodium chloride 0.9 % injection 3 mL  3 mL Intravenous Q12H Gladstone Lighter, MD   3 mL at 10/22/14 0957  . timolol (TIMOPTIC) 0.5 % ophthalmic solution 1 drop  1 drop Both Eyes Daily Gladstone Lighter, MD   1 drop at  10/22/14 0957  . [START ON 10/23/2014] vancomycin (VANCOCIN) IVPB 1000 mg/200 mL premix  1,000 mg Intravenous Q48H Gladstone Lighter, MD  Allergies: No Known Allergies    Past Medical History: Past Medical History  Diagnosis Date  . AAA (abdominal aortic aneurysm) 2005    Open AAA repair  . S/P CABG (coronary artery bypass graft) 2003    At [facility]; PCI 2004; no LHC since 2004  . Echocardiogram abnormal 11/10    Mild LVH, EF 55%, mild AI, diastolic dysfunction  . Hypertension     Unspecified  . Hyperlipidemia     Mixed  . History of orthostatic hypotension   . Carotid artery disease     Right basal ganglia CVA 11/10; CTA neck showed 90% RICA stenosis, 73-22% LICA stenosis  . Coronary artery disease     a. s/p CABG 1995 b. PCI in 2004  . Hx of bronchitis   . Pneumonia     HISTORY  . Gout   . History of AAA (abdominal aortic aneurysm) repair 07/09/2014    2008   . CKD (chronic kidney disease)     baseline creatinine of 1.8  . Gout      Past Surgical History: Past Surgical History  Procedure Laterality Date  . Coronary artery bypass graft  2003  . Abdominal aortic aneurysm repair  2005     Family History: No family history of ESRD, strong family history of coronary artery disease.   Social History: Social History   Social History  . Marital Status: Widowed    Spouse Name: N/A  . Number of Children: N/A  . Years of Education: N/A   Occupational History  . Textile industry     Retired   Social History Main Topics  . Smoking status: Never Smoker   . Smokeless tobacco: Never Used  . Alcohol Use: No  . Drug Use: No  . Sexual Activity: Not Currently   Other Topics Concern  . Not on file   Social History Narrative   Lives alone, does all housework and yardwork.   Son lives nearby.     Review of Systems: Review of Systems  Constitutional: Positive for fever, chills, malaise/fatigue and diaphoresis. Negative for weight loss.  HENT: Positive for  congestion. Negative for ear discharge, ear pain and hearing loss.   Eyes: Negative for blurred vision and double vision.  Respiratory: Positive for cough, sputum production and shortness of breath. Negative for hemoptysis.   Cardiovascular: Negative for chest pain, palpitations and leg swelling.  Gastrointestinal: Positive for nausea, vomiting and diarrhea. Negative for heartburn.  Genitourinary: Negative for dysuria, urgency and frequency.  Musculoskeletal: Negative for myalgias and back pain.  Skin: Positive for rash.  Neurological: Positive for weakness. Negative for dizziness, focal weakness, seizures and headaches.  Endo/Heme/Allergies: Negative for environmental allergies and polydipsia. Does not bruise/bleed easily.  Psychiatric/Behavioral: Negative for depression. The patient is not nervous/anxious.      Vital Signs: Blood pressure 90/63, pulse 63, temperature 97.5 F (36.4 C), temperature source Oral, resp. rate 18, height 5' 6" (1.676 m), weight 75.751 kg (167 lb), SpO2 100 %.  Weight trends: Filed Weights   10/15/2014 1911  Weight: 75.751 kg (167 lb)    Physical Exam: General: NAD, resting in bed  Head: Normocephalic, atraumatic.  Eyes: Anicteric, EOMI  Nose: Mucous membranes moist, not inflammed, nonerythematous.  Throat: Oral mucosa moist, no pharyngitis  Neck: supple.  Lungs:  Normal respiratory effort. Bilateral rhonchi, normal effort  Heart: S1S2 irregular at times 2/6 SEM  Abdomen:  BS normoactive. Soft, Nondistended, non-tender.  No masses or organomegaly.  Extremities: 1+ b/l LE  edema  Neurologic: A&O X3, Motor strength is 5/5 in the all 4 extremities  Skin: Warm, dry, mild rubor b/l LE's.    Lab results: Basic Metabolic Panel:  Recent Labs Lab 10/06/2014 1921 10/22/14 0654  NA 135 136  K 3.4* 3.3*  CL 100* 104  CO2 20* 22  GLUCOSE 159* 133*  BUN 55* 56*  CREATININE 3.02* 2.91*  CALCIUM 8.8* 8.0*    Liver Function Tests:  Recent Labs Lab  10/09/2014 1921  AST 81*  ALT 14*  ALKPHOS 84  BILITOT 1.3*  PROT 7.5  ALBUMIN 3.6   No results for input(s): LIPASE, AMYLASE in the last 168 hours. No results for input(s): AMMONIA in the last 168 hours.  CBC:  Recent Labs Lab 10/19/2014 1921 10/22/14 0654  WBC 9.5 7.7  NEUTROABS 7.3*  --   HGB 12.4* 10.4*  HCT 36.8* 30.7*  MCV 94.1 94.4  PLT 165 130*    Cardiac Enzymes:  Recent Labs Lab 10/22/2014 1921 10/09/2014 2348 10/22/14 0654  TROPONINI 17.10* 19.39* 17.78*    BNP: Invalid input(s): POCBNP  CBG: No results for input(s): GLUCAP in the last 168 hours.  Microbiology: Results for orders placed or performed during the hospital encounter of 10/04/2014  Blood Culture (routine x 2)     Status: None (Preliminary result)   Collection Time: 10/15/2014  7:21 PM  Result Value Ref Range Status   Specimen Description BLOOD LEFT ASSIST CONTROL  Final   Special Requests BOTTLES DRAWN AEROBIC AND ANAEROBIC 5CC  Final   Culture NO GROWTH < 24 HOURS  Final   Report Status PENDING  Incomplete  Blood Culture (routine x 2)     Status: None (Preliminary result)   Collection Time: 10/20/2014  7:21 PM  Result Value Ref Range Status   Specimen Description BLOOD LEFT ARM  Final   Special Requests BOTTLES DRAWN AEROBIC AND ANAEROBIC 4CC  Final   Culture NO GROWTH < 24 HOURS  Final   Report Status PENDING  Incomplete  Stool culture     Status: None (Preliminary result)   Collection Time: 10/14/2014 11:36 PM  Result Value Ref Range Status   Specimen Description STOOL  Final   Special Requests Normal  Final   Culture NO SALMONELLA OR SHIGELLA ISOLATED  Final   Report Status PENDING  Incomplete  C difficile quick scan w PCR reflex     Status: None   Collection Time: 10/12/2014 11:36 PM  Result Value Ref Range Status   C Diff antigen NEGATIVE NEGATIVE Final   C Diff toxin NEGATIVE NEGATIVE Final   C Diff interpretation Negative for C. difficile  Final  Urine culture     Status: None  (Preliminary result)   Collection Time: 10/22/14  5:00 AM  Result Value Ref Range Status   Specimen Description URINE, RANDOM  Final   Special Requests NONE  Final   Culture NO GROWTH < 12 HOURS  Final   Report Status PENDING  Incomplete  Culture, expectorated sputum-assessment     Status: None (Preliminary result)   Collection Time: 10/22/14  5:00 AM  Result Value Ref Range Status   Specimen Description SPUTUM  Final   Special Requests Normal  Final   Sputum evaluation   Final    Sputum specimen not acceptable for testing.  Please recollect.   CALLED LISA SCOTT AT [phone] 10/22/14.PMH   Report Status PENDING  Incomplete    Coagulation Studies:  Recent Labs  10/31/2014 1921  LABPROT  16.9*  INR 1.35    Urinalysis:  Recent Labs  10/22/14 0500  COLORURINE YELLOW*  LABSPEC 1.014  PHURINE 5.0  GLUCOSEU NEGATIVE  HGBUR NEGATIVE  BILIRUBINUR NEGATIVE  KETONESUR NEGATIVE  PROTEINUR NEGATIVE  NITRITE NEGATIVE  LEUKOCYTESUR NEGATIVE      Imaging: US Renal  10/22/2014   CLINICAL DATA:  Acute renal failure  EXAM: RENAL / URINARY TRACT ULTRASOUND COMPLETE  COMPARISON:  CT abdomen 12/27/2003  FINDINGS: Right Kidney:  Length: 13.2 cm. Increased renal cortical echogenicity. 6.3 x 6.5 x 7.6 cm anechoic right lower pole renal mass with increased through transmission most consistent with a cyst. 3.6 x 5.1 x 2.1 cm anechoic right mid pole renal mass with increased through transmission most consistent with a cyst. 3.9 x 5.4 x 5.5 cm hypoechoic right upper pole renal mass with debris layering dependently without internal Doppler flow most consistent with a mildly complicated cyst. No solid mass or hydronephrosis visualized.  Left Kidney:  Length: 9.3 cm. Echogenicity within normal limits. No mass or hydronephrosis visualized.  Bladder:  Appears normal for degree of bladder distention.  IMPRESSION: 1. No obstructive uropathy. 2. Mildly complicated cyst in the upper pole of the right kidney.  Follow-up renal ultrasound in 6 months recommended. 3. Multiple right renal simple cysts.   Electronically Signed   By: Kathreen Devoid   On: 10/22/2014 09:46   Dg Chest Port 1 View  10/05/2014   CLINICAL DATA:  Patient has afever, diarrhea, cough. Patient presents with congestion and coughing up yellow sputum. Hx htn, aaa, cabg, cad, bronchitis, pneumonia  EXAM: PORTABLE CHEST - 1 VIEW  COMPARISON:  07/30/2014  FINDINGS: Stable changes from previous CABG surgery. Cardiac silhouette is top-normal size. Aorta is uncoiled. No mediastinal or hilar masses or convincing adenopathy.  Lungs are clear.  No pleural effusion or pneumothorax.  Bony thorax is demineralized grossly intact.  IMPRESSION: No acute cardiopulmonary disease.   Electronically Signed   By: Lajean Manes M.D.   On: 10/27/2014 20:15      Assessment & Plan: 81 y.o. male with a PMHx of abdominal aortic aneurysm status post open repair 2005, status post CABG with PCI in 8280, chronic systolic heart failure ejection fraction 20-25%, hypertension, hyperlipidemia, right carotid artery disease, gout, chronic kidney disease stage III Baseline creatinine 1.5 with an EGFR 36 who presented to [facility] with fevers, chills, cough.   1.  Acute renal failure/chronic kidney disease stage III baseline Cr 1.5/egfr 36:  Renal ultrasound was unremarkable for hydronephrosis. Urinalysis unremarkable. Renal failure likely multifactorial with contributions from nausea, vomiting, diarrhea, underlying heart failure, and pneumonia. Continue gentle IV fluid hydration with 0.9 normal saline at 60 cc per hour. May need to reduce rate if patient develops shortness of breath as he does have underlying chronic systolic heart failure. Continue to monitor renal function daily. Would avoid contrast at this point in time. 2.  Anemia of CKD: Hemoglobin currently down to 10.4. No indication for Procrit at present. Continue to monitor CBC. 3.  Mildly  complicated right renal cyst/Acquired renal cyst.  Patient had multiple cysts of the right kidney. One of these is described as being slightly complex. Radiologist recommended 6 month follow-up which we can consider in the future but patient would not be a candidate for any aggressive therapy if this were to become a carcinoma. 4. Thank you for consultation. Further plan as patient progresses.

## 2014-10-22 NOTE — Progress Notes (Signed)
Spoke with Dr Anne Hahn regarding pt's lungs sounding wetter.  MD told to stop fluids at this time and continue to monitor.  Cristela Felt, RN

## 2014-10-22 NOTE — Care Management (Signed)
Patient lives alone and has Charity fundraiser alert.  He is able to perform his own adls, and denies issues accessing medical care and meds.  He does not want to consider any type of facility placement unless it is absolutely necessary.  In home care giver support could be arranged if needed and patient would be very agreeable to home health nure, therapist, aide.  Prior to admission he started feeling bad several day prior- diarrhea, weakness, recent bout with pneumonia.  Admitted with sepsis and nstemi.  Troponin up to 19.3

## 2014-10-22 NOTE — Consult Note (Signed)
Cardiology Consultation Note  Patient ID: [name], MRN: [id number], DOB/AGE: 1912/10/11 81 y.o. Admit date: 10/16/2014   Date of Consult: 10/22/2014 Primary Physician: Brayton El, MD Primary Cardiologist: Dr. Elease Hashimoto, MD  Chief Complaint:  Reason for Consult: Elevated troponin   HPI: 81 y.o. male with h/o CAD s/p CABG in 1995, s/p PCI/stenting in 2004, chronic systolic CHF with EF 20-25% by echo on 08/04/2012, PAF not on long term full dose anticoagulation secondary to alveolar hemorrhage, pulmonary HTN, AAA repair, prior CEA, question of prior stroke, gout, HTN, HLD, orthostatic hypotension, and CKD stage III who presented to [facility] on 9/18 with a one month history of diarrhea and 2-3 day history of increased cough and fever. Troponin was found to be 17.10-->19.39-->17.78. Cardiology was consulted for further evaluation of the elevated troponin.   He has been having increasing SOB over the past 9 months or so since coming off an episode of bronchitis. He was recently seen by his pulmonologist and reports having had a CXR at that time time that was read at normal. He lives by himself and is fairly active. He enjoys picking up limbs that blow off trees on his property when the wind break them off.   Over the past 3-4 weeks he has been experiencing episodes of uncontrolled diarrhea with no associated nausea, vomiting, diaphoresis, chest pain, presyncope, or syncope. He does note some increased SOB and cough that has been unproductive. In the same time span he has been experiencing a flare up of his gout along the right great toe. He notes some swelling along the right great toe into the right ankle, otherwise he denies any swelling of the lower extremities. He denies any abdominal swelling or distension.   Because of his increased weakness, diarrhea, and right great toe pain he presented to [facility] for further evaluation. Upon his arrival he was found to have a troponin of 17.10-->19.39-->17.78. ECG  showed NSR, 92 bmp, LBBB. CXR with no cardiopulmonary disease. SCr 3.02 (baseline approx 1.8). WBC 9.5. He was started on heparin gtt. Echo is pending. UA unremarkable. Lactic acid 2.3. C diff negative. Stool culture pending. Blood culture with no growth <12 hours x 2. This morning he has no complaints and is resting comfortably.      Past Medical History  Diagnosis Date  . AAA (abdominal aortic aneurysm) 2005    Open AAA repair  . S/P CABG (coronary artery bypass graft) 2003    At [facility]; PCI 2004; no LHC since 2004  . Echocardiogram abnormal 11/10    Mild LVH, EF 55%, mild AI, diastolic dysfunction  . Hypertension     Unspecified  . Hyperlipidemia     Mixed  . History of orthostatic hypotension   . Carotid artery disease     Right basal ganglia CVA 11/10; CTA neck showed 90% RICA stenosis, 75-80% LICA stenosis  . Coronary artery disease     a. s/p CABG 1995 b. PCI in 2004  . Hx of bronchitis   . Pneumonia     HISTORY  . Gout   . History of AAA (abdominal aortic aneurysm) repair 07/09/2014    2008   . CKD (chronic kidney disease)     baseline creatinine of 1.8  . Gout       Most Recent Cardiac Studies: Echo 08/04/2012:  Study Conclusions  - Left ventricle: The cavity size was normal. There was mild concentric hypertrophy. Systolic function was severely reduced. The estimated ejection fraction was in  the range of 20% to 25%. Severe diffuse hypokinesis with no identifiable regional variations. Features are consistent with a pseudonormal left ventricular filling pattern, with concomitant abnormal relaxation and increased filling pressure (grade 2 diastolic dysfunction). - Aortic valve: There was moderate stenosis. Mild regurgitation. Mean gradient: 13mm Hg (S). Valve area: 0.96cm^2 (Vmax). Low gradient likely due to low cardiac output. - Mitral valve: Moderate regurgitation. - Left atrium: The atrium was moderately dilated. - Right ventricle: Systolic  function was mildly reduced. - Right atrium: The atrium was mildly dilated. - Tricuspid valve: Moderate regurgitation. - Pulmonic valve: Moderate regurgitation. - Pulmonary arteries: Systolic pressure was moderately to severely increased. PA peak pressure: 66mm Hg (S).   Echo 02/17/2011:  Study Conclusions  - Left ventricle: The cavity size was normal. There was mild concentric hypertrophy. Systolic function was moderately reduced. The estimated ejection fraction was in the range of 35% to 40%. Diffuse hypokinesis. Hypokinesis of the anterior myocardium. Hypokinesis of the anteroseptal myocardium. Doppler parameters are consistent with abnormal left ventricular relaxation (grade 1 diastolic dysfunction). - Aortic valve: Mild to Moderate regurgitation. - Mitral valve: Moderate regurgitation. - Left atrium: The atrium was mildly dilated. - Tricuspid valve: Mild to Moderate regurgitation. - Pulmonary arteries: Systolic pressure was elevated consistent with mild pulmonary HTN.   Surgical History:  Past Surgical History  Procedure Laterality Date  . Coronary artery bypass graft  2003  . Abdominal aortic aneurysm repair  2005     Home Meds: Prior to Admission medications   Medication Sig Start Date End Date Taking? Authorizing Lexi Conaty  amiodarone (PACERONE) 200 MG tablet Take one tablet by mouth once a week 08/02/14  Yes Vesta Mixer, MD  Melatonin 10 MG TABS Take 1 tablet by mouth at bedtime as needed.   Yes Historical Boruch Manuele, MD  albuterol (PROVENTIL HFA;VENTOLIN HFA) 108 (90 BASE) MCG/ACT inhaler Inhale 2 puffs into the lungs every 6 (six) hours as needed for wheezing or shortness of breath. 07/30/14   Ellyn Hack, MD  ALPRAZolam Prudy Feeler) 1 MG tablet Take 1 tablet (1 mg total) by mouth at bedtime as needed for anxiety. 10/11/14   Ellyn Hack, MD  BREO ELLIPTA 100-25 MCG/INH AEPB Take 1 puff by mouth daily.  08/17/14   Historical Devlynn Knoff, MD    clopidogrel (PLAVIX) 75 MG tablet Take 1 tablet (75 mg total) by mouth daily. 09/24/14   Ellyn Hack, MD  docusate sodium (COLACE) 100 MG capsule Take 100 mg by mouth daily as needed.     Historical Queenie Aufiero, MD  finasteride (PROSCAR) 5 MG tablet Take 1 tablet (5 mg total) by mouth daily. 10/11/14   Ellyn Hack, MD  Multiple Vitamin (MULTIVITAMIN) tablet Take 1 tablet by mouth daily.    Historical Dhyana Bastone, MD  potassium chloride 20 MEQ TBCR Take 10 mEq by mouth daily. 08/29/13   Vesta Mixer, MD  pravastatin (PRAVACHOL) 40 MG tablet Take 1 tablet (40 mg total) by mouth daily. 10/11/14   Ellyn Hack, MD  timolol (TIMOPTIC) 0.5 % ophthalmic solution Place 1 drop into both eyes at bedtime.     Historical Westly Hinnant, MD  torsemide (DEMADEX) 20 MG tablet Take 1 tablet (20 mg total) by mouth daily. Patient taking differently: Take 20 mg by mouth daily. Do not take more than 1 tablet daily for more than 3 days in a row 08/14/14   Vesta Mixer, MD    Inpatient Medications:  . amiodarone  200 mg Oral Weekly  . antiseptic oral rinse  7 mL Mouth Rinse q12n4p  . aspirin EC  81 mg Oral Daily  . chlorhexidine  15 mL Mouth Rinse BID  . clopidogrel  75 mg Oral Daily  . colchicine  0.6 mg Oral Daily  . finasteride  5 mg Oral Daily  . ipratropium-albuterol  3 mL Nebulization Q4H  . mometasone-formoterol  2 puff Inhalation BID  . multivitamin with minerals  1 tablet Oral Daily  . piperacillin-tazobactam (ZOSYN)  IV  3.375 g Intravenous Q12H  . pravastatin  40 mg Oral Daily  . predniSONE  60 mg Oral Q breakfast  . sodium chloride  3 mL Intravenous Q12H  . timolol  1 drop Both Eyes Daily  . [START ON 10/23/2014] vancomycin  1,000 mg Intravenous Q48H   . sodium chloride 60 mL/hr at 10/24/2014 2237  . heparin 12 Units/kg/hr (10/31/2014 2236)    Allergies: No Known Allergies  Social History   Social History  . Marital Status: Widowed    Spouse Name: N/A  . Number of Children: N/A  . Years  of Education: N/A   Occupational History  . Textile industry     Retired   Social History Main Topics  . Smoking status: Never Smoker   . Smokeless tobacco: Never Used  . Alcohol Use: No  . Drug Use: No  . Sexual Activity: Not Currently   Other Topics Concern  . Not on file   Social History Narrative   Lives alone, does all housework and yardwork.   Son lives nearby.     Family History  Problem Relation Age of Onset  . Family history unknown: Yes     Review of Systems: Review of Systems  Constitutional: Positive for fever, chills, weight loss and malaise/fatigue. Negative for diaphoresis.       T max 101.4  HENT: Negative for congestion.   Eyes: Negative for blurred vision, discharge and redness.  Respiratory: Positive for cough and shortness of breath. Negative for hemoptysis, sputum production and wheezing.   Cardiovascular: Negative for chest pain, palpitations, orthopnea, claudication, leg swelling and PND.  Skin: Negative for rash.  Neurological: Positive for weakness. Negative for headaches.     Labs:  Recent Labs  10/14/2014 1921 10/29/2014 2348 10/22/14 0654  TROPONINI 17.10* 19.39* 17.78*   Lab Results  Component Value Date   WBC 7.7 10/22/2014   HGB 10.4* 10/22/2014   HCT 30.7* 10/22/2014   MCV 94.4 10/22/2014   PLT 130* 10/22/2014    Recent Labs Lab 10/20/2014 1921 10/22/14 0654  NA 135 136  K 3.4* 3.3*  CL 100* 104  CO2 20* 22  BUN 55* 56*  CREATININE 3.02* 2.91*  CALCIUM 8.8* 8.0*  PROT 7.5  --   BILITOT 1.3*  --   ALKPHOS 84  --   ALT 14*  --   AST 81*  --   GLUCOSE 159* 133*   Lab Results  Component Value Date   CHOL 158 01/09/2014   HDL 39 01/09/2014   LDLCALC 99 01/09/2014   TRIG 102 01/09/2014   No results found for: DDIMER  Radiology/Studies:  Dg Chest Port 1 View  10/18/2014   CLINICAL DATA:  Patient has afever, diarrhea, cough. Patient presents with congestion and coughing up yellow sputum. Hx htn, aaa, cabg, cad,  bronchitis, pneumonia  EXAM: PORTABLE CHEST - 1 VIEW  COMPARISON:  07/30/2014  FINDINGS: Stable changes from previous CABG surgery. Cardiac silhouette is top-normal  size. Aorta is uncoiled. No mediastinal or hilar masses or convincing adenopathy.  Lungs are clear.  No pleural effusion or pneumothorax.  Bony thorax is demineralized grossly intact.  IMPRESSION: No acute cardiopulmonary disease.   Electronically Signed   By: Amie Portland M.D.   On: 10/13/2014 20:15    EKG: NSR, 92 bpm, LBBB  Weights: Filed Weights   10/15/2014 1911  Weight: 167 lb (75.751 kg)     Physical Exam: Blood pressure 98/69, pulse 63, temperature 98.2 F (36.8 C), temperature source Oral, resp. rate 19, height  (1.676 m), weight 167 lb (75.751 kg), SpO2 100 %. Body mass index is 26.97 kg/(m^2). General: Well developed, well nourished, in no acute distress. Head: Normocephalic, atraumatic, sclera non-icteric, no xanthomas, nares are without discharge.  Neck: Negative for carotid bruits. JVD not elevated. Lungs: Diffuse rhonchi bilaterally. Breathing is unlabored. Heart: RRR with S1 S2. II/VI systolic murmur. No rubs, or gallops appreciated. Abdomen: Soft, non-tender, non-distended with normoactive bowel sounds. No hepatomegaly. No rebound/guarding. No obvious abdominal masses. Msk:  Strength and tone appear normal for age. Extremities: No clubbing or cyanosis. No edema.  Distal pedal pulses are 2+ and equal bilaterally. Neuro: Alert and oriented X 3. No facial asymmetry. No focal deficit. Moves all extremities spontaneously. Psych:  Responds to questions appropriately with a normal affect.    Assessment and Plan:  81 y.o. male with h/o CAD s/p CABG in 1995, s/p PCI/stenting in 2004, chronic systolic CHF with EF 20-25% by echo on 08/04/2012, PAF not on long term full dose anticoagulation secondary to alveolar hemorrhage, pulmonary HTN, AAA repair, prior CEA, question of prior stroke, gout, HTN, HLD, orthostatic  hypotension, and CKD stage III who presented to [facility] on 9/18 with a one month history of diarrhea and 2-3 day history of increased cough and fever. Troponin was found to be 17.10-->19.39-->17.78.  1. NSTEMI/CAD: -No anginal symptoms -In the setting of acute diarrhea GI illness ongoing for 3-4 weeks and possible sepsis  -Medical management with heparin gtt x 48 hours given patient's advanced age  -Check echo to evaluate LV function and wall motion -Aspirin was held in April/May of 2016 in the setting of alveloar hemorrhage  -His son had previously restarted his Plavix 2/2 ? History of stroke -Continue Plavix -BP soft, thus not on beta blocker, start when able -Continue statin    2. Acute GI illness/sepsis: -ABX per IM -GI on board -Cultures pending  3 Pulmonary HTN: -Check echo as above -Soft BP and acute on CKD preclude diuresis at this time -Start when able  4. Acute on CKD stage III: -Likely pre-renal in the setting of dehydration 2/2 diarrhea  -Improving with hydration  -Avoid nephrotoxins   5. Chronic systolic CHF: -Not volume overloaded at this time -Check echo as above -Restart HF meds when able  6. PAF: -Currently in sinus rhythm -Not on long term full dose anticoagulation 2/2 history of hemorrhage -CHADSVASc at least 7 (CHF, HTN, age x 2, stroke, vascular disease)    Signed, Eula Listen, PA-C Pager: [phone] 10/22/2014, 7:56 AM

## 2014-10-22 NOTE — Progress Notes (Signed)
ANTICOAGULATION CONSULT NOTE - Follow Up Consult  Pharmacy Consult for heparin gtt dosing and monitoring Indication: chest pain/ACS  No Known Allergies  Patient Measurements: Height:  (167.6 cm) Weight: 167 lb (75.751 kg) IBW/kg (Calculated) : 63.8 Heparin Dosing Weight: 75.8  Vital Signs: Temp: 98.2 F (36.8 C) (09/18 2315) Temp Source: Oral (09/18 2315) BP: 98/69 mmHg (09/19 0429) Pulse Rate: 63 (09/19 0429)  Labs:  Recent Labs  10/15/2014 1921 10/18/2014 2348 10/22/14 0654  HGB 12.4*  --  10.4*  HCT 36.8*  --  30.7*  PLT 165  --  130*  APTT 35  --   --   LABPROT 16.9*  --   --   INR 1.35  --   --   HEPARINUNFRC  --   --  0.33  CREATININE 3.02*  --  2.91*  TROPONINI 17.10* 19.39* 17.78*    Estimated Creatinine Clearance: 12.5 mL/min (by C-G formula based on Cr of 2.91).  Assessment: 81yo patient being treated with NSTEMI and sepsis. Pharmacy consulted for heparin gtt dosing and monitoring. Heparin level at 0654 was with in goal range at 0.33.   Goal of Therapy:  Heparin level 0.3-0.7 units/ml Monitor platelets by anticoagulation protocol: Yes   Plan:  Continue Heparin infusion at 900 units/hr.  Check anti-Xa level in 8 hours and daily while on heparin Continue to monitor H&H and platelets. Pharmacy will adjust and monitor based on heparin level at 1500 today.   Sheema M Hallaji 10/22/2014,8:12 AM

## 2014-10-22 NOTE — Consult Note (Signed)
[facility]  [address] [place], [place] [address] Phone: [phone] Fax : [phone]  Consultation  Referring Provider:     No ref. provider found Primary Care Physician:  Brayton El, MD Primary Gastroenterologist:      Reason for Consultation:     Diarrhea  Date of Admission:  10/20/2014 Date of Consultation:  10/22/2014         HPI:   [name] is a 81 y.o. male who was admitted with a MI. The patient reports that he has been having diarrhea for the last one month. He reports that prior to that he was constipated. There is no report of any weight loss, fevers, chills, nausea or vomiting.  The patient was found to have a  Elevated troponin. He states that he drinks milk often. He is having a milkshake while I am speaking to him in his hospital room. The patient's daughter states that she notices he has diarrhea for 2 to 3 days after drinking milk but then the diarrhea goes away. There is no report of any abdominal pain. He also reports that he has had colonoscopies in the past but not in the last many years. He also reports that he does not want to have another colonoscopy.  Past Medical History  Diagnosis Date  . AAA (abdominal aortic aneurysm) 2005    Open AAA repair  . S/P CABG (coronary artery bypass graft) 2003    At [facility]; PCI 2004; no LHC since 2004  . Echocardiogram abnormal 11/10    Mild LVH, EF 55%, mild AI, diastolic dysfunction  . Hypertension     Unspecified  . Hyperlipidemia     Mixed  . History of orthostatic hypotension   . Carotid artery disease     Right basal ganglia CVA 11/10; CTA neck showed 90% RICA stenosis, 75-80% LICA stenosis  . Coronary artery disease     a. s/p CABG 1995 b. PCI in 2004  . Hx of bronchitis   . Pneumonia     HISTORY  . Gout   . History of AAA (abdominal aortic aneurysm) repair 07/09/2014    2008   . CKD (chronic kidney disease)     baseline creatinine of 1.8  . Gout     Past Surgical History    Procedure Laterality Date  . Coronary artery bypass graft  2003  . Abdominal aortic aneurysm repair  2005    Prior to Admission medications   Medication Sig Start Date End Date Taking? Authorizing Provider  amiodarone (PACERONE) 200 MG tablet Take one tablet by mouth once a week 08/02/14  Yes Vesta Mixer, MD  Melatonin 10 MG TABS Take 1 tablet by mouth at bedtime as needed.   Yes Historical Provider, MD  albuterol (PROVENTIL HFA;VENTOLIN HFA) 108 (90 BASE) MCG/ACT inhaler Inhale 2 puffs into the lungs every 6 (six) hours as needed for wheezing or shortness of breath. 07/30/14   Ellyn Hack, MD  ALPRAZolam Prudy Feeler) 1 MG tablet Take 1 tablet (1 mg total) by mouth at bedtime as needed for anxiety. 10/11/14   Ellyn Hack, MD  BREO ELLIPTA 100-25 MCG/INH AEPB Take 1 puff by mouth daily.  08/17/14   Historical Provider, MD  clopidogrel (PLAVIX) 75 MG tablet Take 1 tablet (75 mg total) by mouth daily. 09/24/14   Ellyn Hack, MD  docusate sodium (COLACE) 100 MG capsule Take 100 mg by mouth daily as needed.  Historical Provider, MD  finasteride (PROSCAR) 5 MG tablet Take 1 tablet (5 mg total) by mouth daily. 10/11/14   Ellyn Hack, MD  Multiple Vitamin (MULTIVITAMIN) tablet Take 1 tablet by mouth daily.    Historical Provider, MD  potassium chloride 20 MEQ TBCR Take 10 mEq by mouth daily. 08/29/13   Vesta Mixer, MD  pravastatin (PRAVACHOL) 40 MG tablet Take 1 tablet (40 mg total) by mouth daily. 10/11/14   Ellyn Hack, MD  timolol (TIMOPTIC) 0.5 % ophthalmic solution Place 1 drop into both eyes at bedtime.     Historical Provider, MD  torsemide (DEMADEX) 20 MG tablet Take 1 tablet (20 mg total) by mouth daily. Patient taking differently: Take 20 mg by mouth daily. Do not take more than 1 tablet daily for more than 3 days in a row 08/14/14   Vesta Mixer, MD    Family History  Problem Relation Age of Onset  . Family history unknown: Yes     Social History  Substance Use  Topics  . Smoking status: Never Smoker   . Smokeless tobacco: Never Used  . Alcohol Use: No    Allergies as of 2014/11/01  . (No Known Allergies)    Review of Systems:    All systems reviewed and negative except where noted in HPI.   Physical Exam:  Vital signs in last 24 hours: Temp:  [97.5 F (36.4 C)-101.4 F (38.6 C)] 97.5 F (36.4 C) (09/19 1111) Pulse Rate:  [63-130] 63 (09/19 1118) Resp:  [12-21] 18 (09/19 1111) BP: (84-108)/(59-73) 90/63 mmHg (09/19 1118) SpO2:  [91 %-100 %] 99 % (09/19 1512) Weight:  [167 lb (75.751 kg)] 167 lb (75.751 kg) (09/18 1911) Last BM Date: 10/22/14 General:   Pleasant, cooperative in NAD Head:  Normocephalic and atraumatic. Eyes:   No icterus.   Conjunctiva pink. PERRLA. Ears:  Normal auditory acuity. Neck:  Supple; no masses or thyroidomegaly Lungs: Respirations even and unlabored. Lungs clear to auscultation bilaterally.   No wheezes, crackles, or rhonchi.  Heart:  Regular rate and rhythm;  Without murmur, clicks, rubs or gallops Abdomen:  Soft, nondistended, nontender. Normal bowel sounds. No appreciable masses or hepatomegaly.  No rebound or guarding.  Rectal:  Not performed. Msk:  Symmetrical without gross deformities.  Strength  Extremities:  Without edema, cyanosis or clubbing. Neurologic:  Alert and oriented x3;  grossly normal neurologically. Skin:  Intact without significant lesions or rashes. Cervical Nodes:  No significant cervical adenopathy. Psych:  Alert and cooperative. Normal affect.  LAB RESULTS:  Recent Labs  11-01-2014 1921 10/22/14 0654  WBC 9.5 7.7  HGB 12.4* 10.4*  HCT 36.8* 30.7*  PLT 165 130*   BMET  Recent Labs  2014-11-01 1921 10/22/14 0654  NA 135 136  K 3.4* 3.3*  CL 100* 104  CO2 20* 22  GLUCOSE 159* 133*  BUN 55* 56*  CREATININE 3.02* 2.91*  CALCIUM 8.8* 8.0*   LFT  Recent Labs  11-01-14 1921  PROT 7.5  ALBUMIN 3.6  AST 81*  ALT 14*  ALKPHOS 84  BILITOT 1.3*   PT/INR  Recent  Labs  11/01/2014 1921  LABPROT 16.9*  INR 1.35    STUDIES: US Renal  10/22/2014   CLINICAL DATA:  Acute renal failure  EXAM: RENAL / URINARY TRACT ULTRASOUND COMPLETE  COMPARISON:  CT abdomen 12/27/2003  FINDINGS: Right Kidney:  Length: 13.2 cm. Increased renal cortical echogenicity. 6.3 x 6.5 x 7.6 cm anechoic right  lower pole renal mass with increased through transmission most consistent with a cyst. 3.6 x 5.1 x 2.1 cm anechoic right mid pole renal mass with increased through transmission most consistent with a cyst. 3.9 x 5.4 x 5.5 cm hypoechoic right upper pole renal mass with debris layering dependently without internal Doppler flow most consistent with a mildly complicated cyst. No solid mass or hydronephrosis visualized.  Left Kidney:  Length: 9.3 cm. Echogenicity within normal limits. No mass or hydronephrosis visualized.  Bladder:  Appears normal for degree of bladder distention.  IMPRESSION: 1. No obstructive uropathy. 2. Mildly complicated cyst in the upper pole of the right kidney. Follow-up renal ultrasound in 6 months recommended. 3. Multiple right renal simple cysts.   Electronically Signed   By: Elige Ko   On: 10/22/2014 09:46   Dg Chest Port 1 View  19-Nov-2014   CLINICAL DATA:  Patient has afever, diarrhea, cough. Patient presents with congestion and coughing up yellow sputum. Hx htn, aaa, cabg, cad, bronchitis, pneumonia  EXAM: PORTABLE CHEST - 1 VIEW  COMPARISON:  07/30/2014  FINDINGS: Stable changes from previous CABG surgery. Cardiac silhouette is top-normal size. Aorta is uncoiled. No mediastinal or hilar masses or convincing adenopathy.  Lungs are clear.  No pleural effusion or pneumothorax.  Bony thorax is demineralized grossly intact.  IMPRESSION: No acute cardiopulmonary disease.   Electronically Signed   By: Amie Portland M.D.   On: 11-19-2014 20:15      Impression / Plan:   [name] is a 81 y.o. y/o male with one month of diarrhea. The patient was admitted with  a heart attack. He stool studies have been negative for any pathogens. The patient will avoid milk products to see if his diarrhea improves. The patient has been explained the plan and agrees with it.  Thank you for involving me in the care of this patient.      LOS: 1 day   Darlina Rumpf, MD  10/22/2014, 7:01 PM   Note: This dictation was prepared with Dragon dictation along with smaller phrase technology. Any transcriptional errors that result from this process are unintentional.   2

## 2014-10-22 NOTE — Progress Notes (Signed)
Admission skin assessment done with Carolann Littler, RN

## 2014-10-22 NOTE — Progress Notes (Signed)
*  PRELIMINARY RESULTS* Echocardiogram 2D Echocardiogram has been performed.  Garrel Ridgel Stills 10/22/2014, 11:25 AM

## 2014-10-22 NOTE — Progress Notes (Signed)
MD was made aware of pt's not voiding and bladder scan only 217 mls ,reading , order received to inserting foley if bladder scan >350 mls, will continue to monitor

## 2014-10-23 LAB — CBC
HEMATOCRIT: 33.4 % — AB (ref 40.0–52.0)
HEMOGLOBIN: 10.9 g/dL — AB (ref 13.0–18.0)
MCH: 30.9 pg (ref 26.0–34.0)
MCHC: 32.8 g/dL (ref 32.0–36.0)
MCV: 94.3 fL (ref 80.0–100.0)
Platelets: 154 10*3/uL (ref 150–440)
RBC: 3.54 MIL/uL — AB (ref 4.40–5.90)
RDW: 14.7 % — ABNORMAL HIGH (ref 11.5–14.5)
WBC: 9.6 10*3/uL (ref 3.8–10.6)

## 2014-10-23 LAB — BLOOD GAS, VENOUS
ACID-BASE DEFICIT: 3.9 mmol/L — AB (ref 0.0–2.0)
Bicarbonate: 21.6 mEq/L (ref 21.0–28.0)
PATIENT TEMPERATURE: 37
pCO2, Ven: 40 mmHg — ABNORMAL LOW (ref 44.0–60.0)
pH, Ven: 7.34 (ref 7.320–7.430)

## 2014-10-23 LAB — BASIC METABOLIC PANEL
Anion gap: 12 (ref 5–15)
BUN: 66 mg/dL — AB (ref 6–20)
CHLORIDE: 102 mmol/L (ref 101–111)
CO2: 19 mmol/L — AB (ref 22–32)
CREATININE: 2.82 mg/dL — AB (ref 0.61–1.24)
Calcium: 8 mg/dL — ABNORMAL LOW (ref 8.9–10.3)
GFR calc non Af Amer: 17 mL/min — ABNORMAL LOW (ref >60)
GFR, EST AFRICAN AMERICAN: 20 mL/min — AB (ref >60)
Glucose, Bld: 169 mg/dL — ABNORMAL HIGH (ref 65–99)
POTASSIUM: 3.2 mmol/L — AB (ref 3.5–5.1)
SODIUM: 133 mmol/L — AB (ref 135–145)

## 2014-10-23 LAB — URINE CULTURE: Culture: NO GROWTH

## 2014-10-23 LAB — HEPARIN LEVEL (UNFRACTIONATED)
HEPARIN UNFRACTIONATED: 0.27 [IU]/mL — AB (ref 0.30–0.70)
HEPARIN UNFRACTIONATED: 0.42 [IU]/mL (ref 0.30–0.70)
Heparin Unfractionated: 0.45 IU/mL (ref 0.30–0.70)

## 2014-10-23 LAB — TROPONIN I: Troponin I: 15.62 ng/mL — ABNORMAL HIGH (ref <0.031)

## 2014-10-23 MED ORDER — HEPARIN (PORCINE) IN NACL 100-0.45 UNIT/ML-% IJ SOLN
1050.0000 [IU]/h | INTRAMUSCULAR | Status: DC
  Administered 2014-10-23 – 2014-10-24 (×2): 1050 [IU]/h via INTRAVENOUS
  Filled 2014-10-23 (×4): qty 250

## 2014-10-23 MED ORDER — HEPARIN BOLUS VIA INFUSION
1100.0000 [IU] | Freq: Once | INTRAVENOUS | Status: AC
  Administered 2014-10-23: 1100 [IU] via INTRAVENOUS
  Filled 2014-10-23: qty 1100

## 2014-10-23 NOTE — Progress Notes (Signed)
ANTICOAGULATION CONSULT NOTE - Initial Consult  Pharmacy Consult for heparin gtt dosing and monitoring Indication: chest pain/ACS  No Known Allergies  Patient Measurements: Height:  (167.6 cm) Weight: 167 lb (75.751 kg) IBW/kg (Calculated) : 63.8 Heparin Dosing Weight: 75.8 kg  Vital Signs: Temp: 97.8 F (36.6 C) (09/20 0434) BP: 100/81 mmHg (09/20 0434) Pulse Rate: 69 (09/20 0434)  Labs:  Recent Labs  10-22-14 1921 2014/10/22 2348 10/22/14 0654 10/22/14 1243 10/22/14 1453 10/23/14 0443  HGB 12.4*  --  10.4*  --   --   --   HCT 36.8*  --  30.7*  --   --   --   PLT 165  --  130*  --   --   --   APTT 35  --   --   --   --   --   LABPROT 16.9*  --   --   --   --   --   INR 1.35  --   --   --   --   --   HEPARINUNFRC  --   --  0.33  --  0.31 0.27*  CREATININE 3.02*  --  2.91*  --   --   --   TROPONINI 17.10* 19.39* 17.78* 15.62*  --   --     Estimated Creatinine Clearance: 12.5 mL/min (by C-G formula based on Cr of 2.91).   Medical History: Past Medical History  Diagnosis Date  . AAA (abdominal aortic aneurysm) 2005    Open AAA repair  . S/P CABG (coronary artery bypass graft) 2003    At [facility]; PCI 2004; no LHC since 2004  . Echocardiogram abnormal 11/10    Mild LVH, EF 55%, mild AI, diastolic dysfunction  . Hypertension     Unspecified  . Hyperlipidemia     Mixed  . History of orthostatic hypotension   . Carotid artery disease     Right basal ganglia CVA 11/10; CTA neck showed 90% RICA stenosis, 75-80% LICA stenosis  . Coronary artery disease     a. s/p CABG 1995 b. PCI in 2004  . Hx of bronchitis   . Pneumonia     HISTORY  . Gout   . History of AAA (abdominal aortic aneurysm) repair 07/09/2014    2008   . CKD (chronic kidney disease)     baseline creatinine of 1.8  . Gout     Assessment: Pharmacy consulted to dose heparin gtt for chest pain/ACS. Patient was not taking anticoagulants prior to admission.   Goal of Therapy:  Heparin level 0.3-0.7  units/ml Monitor platelets by anticoagulation protocol: Yes   Plan:  Give 4000 units bolus x 1 Start heparin infusion at 900 units/hr Check anti-Xa level in 8 hours and daily while on heparin Continue to monitor H&H and platelets   9/19:  HL @ 7:00 = 0.33           HL @ 15:00 = 0.31  Will continue this pt on current rate of 900 units/hr and recheck HL on 9/20 with AM labs.   09/20 AM anti-Xa 0.27. 1100 unit bolus and increase rate to 1050 units/hr. Recheck in 8 hours.  Kaydan Wong S 10/23/2014,5:49 AM

## 2014-10-23 NOTE — Progress Notes (Signed)
PT Cancellation Note  Patient Details Name: [name] MRN: [id number] DOB: May 21, 1912   Cancelled Treatment:    Reason Eval/Treat Not Completed: Patient declined, no reason specified (Pt states per MD order to rest in bed and start therapy tomorrow.)   Georgina Peer 10/23/2014, 2:23 PM

## 2014-10-23 NOTE — Progress Notes (Signed)
ANTICOAGULATION CONSULT NOTE - FOLLOW UP   Pharmacy Consult for heparin gtt dosing and monitoring Indication: chest pain/ACS  No Known Allergies  Patient Measurements: Height:  (167.6 cm) Weight: 167 lb (75.751 kg) IBW/kg (Calculated) : 63.8 Heparin Dosing Weight: 75.8 kg  Vital Signs: Temp: 98.9 F (37.2 C) (09/20 1146) BP: 99/73 mmHg (09/20 1146) Pulse Rate: 66 (09/20 1146)  Labs:  Recent Labs  10/31/2014 1921 10/27/2014 2348  10/22/14 0654 10/22/14 1243 10/22/14 1453 10/23/14 0443 10/23/14 1349  HGB 12.4*  --   --  10.4*  --   --  10.9*  --   HCT 36.8*  --   --  30.7*  --   --  33.4*  --   PLT 165  --   --  130*  --   --  154  --   APTT 35  --   --   --   --   --   --   --   LABPROT 16.9*  --   --   --   --   --   --   --   INR 1.35  --   --   --   --   --   --   --   HEPARINUNFRC  --   --   < > 0.33  --  0.31 0.27* 0.45  CREATININE 3.02*  --   --  2.91*  --   --  2.82*  --   TROPONINI 17.10* 19.39*  --  17.78* 15.62*  --   --   --   < > = values in this interval not displayed.  Estimated Creatinine Clearance: 12.9 mL/min (by C-G formula based on Cr of 2.82).   Medical History: Past Medical History  Diagnosis Date  . AAA (abdominal aortic aneurysm) 2005    Open AAA repair  . S/P CABG (coronary artery bypass graft) 2003    At [facility]; PCI 2004; no LHC since 2004  . Echocardiogram abnormal 11/10    Mild LVH, EF 55%, mild AI, diastolic dysfunction  . Hypertension     Unspecified  . Hyperlipidemia     Mixed  . History of orthostatic hypotension   . Carotid artery disease     Right basal ganglia CVA 11/10; CTA neck showed 90% RICA stenosis, 75-80% LICA stenosis  . Coronary artery disease     a. s/p CABG 1995 b. PCI in 2004  . Hx of bronchitis   . Pneumonia     HISTORY  . Gout   . History of AAA (abdominal aortic aneurysm) repair 07/09/2014    2008   . CKD (chronic kidney disease)     baseline creatinine of 1.8  . Gout     Assessment: Pharmacy  consulted to dose heparin gtt for chest pain/ACS. Patient was not taking anticoagulants prior to admission.   Goal of Therapy:  Heparin level 0.3-0.7 units/ml Monitor platelets by anticoagulation protocol: Yes   Plan:  Give 4000 units bolus x 1 Start heparin infusion at 900 units/hr Check anti-Xa level in 8 hours and daily while on heparin Continue to monitor H&H and platelets   9/19:  HL @ 7:00 = 0.33           HL @ 15:00 = 0.31  Will continue this pt on current rate of 900 units/hr and recheck HL on 9/20 with AM labs.   09/20 AM anti-Xa 0.27. 1100 unit bolus and increase rate to 1050 units/hr.  Recheck in 8 hours.  9/20: anti Xa level 0.45. Will continue current rate and will order another heparin level for 22:00. CBC with am labs.   James,Teldrin D 10/23/2014,2:40 PM

## 2014-10-23 NOTE — Progress Notes (Signed)
Patient was made an initial appointment at the outpatient [facility] on November 08, 2014 at 11:00am. Thank you.

## 2014-10-23 NOTE — Care Management Important Message (Signed)
Important Message  Patient Details  Name: [name] MRN: [id number] Date of Birth: 1912/10/02   Medicare Important Message Given:  Yes-second notification given    [name] 10/23/2014, 10:58 AM

## 2014-10-23 NOTE — Progress Notes (Signed)
ANTICOAGULATION CONSULT NOTE - FOLLOW UP   Pharmacy Consult for heparin gtt dosing and monitoring Indication: chest pain/ACS  No Known Allergies  Patient Measurements: Height:  (167.6 cm) Weight: 167 lb (75.751 kg) IBW/kg (Calculated) : 63.8 Heparin Dosing Weight: 75.8 kg  Vital Signs: Temp: 97.5 F (36.4 C) (09/20 1924) Temp Source: Oral (09/20 1924) BP: 109/74 mmHg (09/20 1924) Pulse Rate: 73 (09/20 1924)  Labs:  Recent Labs  10/31/2014 1921 10/15/2014 2348 10/22/14 0654 10/22/14 1243  10/23/14 0443 10/23/14 1349 10/23/14 2228  HGB 12.4*  --  10.4*  --   --  10.9*  --   --   HCT 36.8*  --  30.7*  --   --  33.4*  --   --   PLT 165  --  130*  --   --  154  --   --   APTT 35  --   --   --   --   --   --   --   LABPROT 16.9*  --   --   --   --   --   --   --   INR 1.35  --   --   --   --   --   --   --   HEPARINUNFRC  --   --  0.33  --   < > 0.27* 0.45 0.42  CREATININE 3.02*  --  2.91*  --   --  2.82*  --   --   TROPONINI 17.10* 19.39* 17.78* 15.62*  --   --   --   --   < > = values in this interval not displayed.  Estimated Creatinine Clearance: 12.9 mL/min (by C-G formula based on Cr of 2.82).   Medical History: Past Medical History  Diagnosis Date  . AAA (abdominal aortic aneurysm) 2005    Open AAA repair  . S/P CABG (coronary artery bypass graft) 2003    At [facility]; PCI 2004; no LHC since 2004  . Echocardiogram abnormal 11/10    Mild LVH, EF 55%, mild AI, diastolic dysfunction  . Hypertension     Unspecified  . Hyperlipidemia     Mixed  . History of orthostatic hypotension   . Carotid artery disease     Right basal ganglia CVA 11/10; CTA neck showed 90% RICA stenosis, 75-80% LICA stenosis  . Coronary artery disease     a. s/p CABG 1995 b. PCI in 2004  . Hx of bronchitis   . Pneumonia     HISTORY  . Gout   . History of AAA (abdominal aortic aneurysm) repair 07/09/2014    2008   . CKD (chronic kidney disease)     baseline creatinine of 1.8  . Gout      Assessment: Pharmacy consulted to dose heparin gtt for chest pain/ACS. Patient was not taking anticoagulants prior to admission.   Goal of Therapy:  Heparin level 0.3-0.7 units/ml Monitor platelets by anticoagulation protocol: Yes   Plan:  Give 4000 units bolus x 1 Start heparin infusion at 900 units/hr Check anti-Xa level in 8 hours and daily while on heparin Continue to monitor H&H and platelets   9/19:  HL @ 7:00 = 0.33           HL @ 15:00 = 0.31  Will continue this pt on current rate of 900 units/hr and recheck HL on 9/20 with AM labs.   09/20 AM anti-Xa 0.27. 1100 unit bolus and  increase rate to 1050 units/hr. Recheck in 8 hours.  9/20: anti Xa level 0.45. Will continue current rate and will order another heparin level for 22:00. CBC with am labs.   9/20  PM anti-Xa 0.42. No change. Recheck with CBC with AM labs.  McBane,Matthew S 10/23/2014,11:41 PM

## 2014-10-23 NOTE — Progress Notes (Signed)
[facility] [place] [facility]   PATIENT NAME: [name]    MR#:  [id number]  DATE OF BIRTH:  November 24, 1912  SUBJECTIVE:  CHIEF COMPLAINT:   Chief Complaint  Patient presents with  . Fever  . Cough  . Diarrhea     Feels much better today, never had any chest pain. Diarrhea is better after avoiding milk products.  REVIEW OF SYSTEMS:  CONSTITUTIONAL: No fever, fatigue or weakness.  EYES: No blurred or double vision.  EARS, NOSE, AND THROAT: No tinnitus or ear pain.  RESPIRATORY: No cough, shortness of breath, wheezing or hemoptysis.  CARDIOVASCULAR: No chest pain, orthopnea, edema.  GASTROINTESTINAL: No nausea, vomiting, positive for diarrhea , no abdominal pain.  GENITOURINARY: No dysuria, hematuria.  ENDOCRINE: No polyuria, nocturia,  HEMATOLOGY: No anemia, easy bruising or bleeding SKIN: No rash or lesion. MUSCULOSKELETAL: right great toe joint pain or arthritis.   NEUROLOGIC: No tingling, numbness, had generalized weakness.  PSYCHIATRY: No anxiety or depression.   ROS  DRUG ALLERGIES:  No Known Allergies  VITALS:  Blood pressure 109/74, pulse 73, temperature 97.5 F (36.4 C), temperature source Oral, resp. rate 20, height  (1.676 m), weight 75.751 kg (167 lb), SpO2 96 %.  PHYSICAL EXAMINATION:   GENERAL: 81 y.o.-year-old patient lying in the bed with no acute distress.  EYES: Pupils equal, round, reactive to light and accommodation. No scleral icterus. Extraocular muscles intact.  HEENT: Head atraumatic, normocephalic. Oropharynx and nasopharynx clear.  NECK: Supple, no jugular venous distention. No thyroid enlargement, no tenderness.  LUNGS: Coarse rhonchi heard bilaterally over both lungs more prominent posteriorly. Moving air bilaterally. No bruits heard on exam. No crackles heard. No use of accessory muscles of respiration.  CARDIOVASCULAR: S1, S2 normal. No rubs, or gallops. 3/6 systolic murmur present ABDOMEN:  Soft, nontender, appears distended. Bowel sounds present. No organomegaly or mass.  EXTREMITIES: No cyanosis, or clubbing. No pedal edema noted on the left foot. However the right foot big toe is erythematous and tender to touch with some erythema and swelling spreading onto the dorsal side of the foot NEUROLOGIC: Cranial nerves II through XII are intact. Muscle strength 5/5 in all extremities. Sensation intact. Gait not checked. No focal neurological deficits PSYCHIATRIC: The patient is alert and oriented x 3.  SKIN: No obvious rash, lesion, or ulcer. Other than the erythema on the right foot, no rash noted  Physical Exam LABORATORY PANEL:   CBC  Recent Labs Lab 10/23/14 0443  WBC 9.6  HGB 10.9*  HCT 33.4*  PLT 154   ------------------------------------------------------------------------------------------------------------------  Chemistries   Recent Labs Lab 10/05/2014 1921  10/23/14 0443  NA 135  < > 133*  K 3.4*  < > 3.2*  CL 100*  < > 102  CO2 20*  < > 19*  GLUCOSE 159*  < > 169*  BUN 55*  < > 66*  CREATININE 3.02*  < > 2.82*  CALCIUM 8.8*  < > 8.0*  AST 81*  --   --   ALT 14*  --   --   ALKPHOS 84  --   --   BILITOT 1.3*  --   --   < > = values in this interval not displayed. ------------------------------------------------------------------------------------------------------------------  Cardiac Enzymes  Recent Labs Lab 10/22/14 0654 10/22/14 1243  TROPONINI 17.78* 15.62*   ------------------------------------------------------------------------------------------------------------------  RADIOLOGY:  US Renal  10/22/2014   CLINICAL DATA:  Acute renal failure  EXAM: RENAL / URINARY TRACT ULTRASOUND COMPLETE  COMPARISON:  CT abdomen 12/27/2003  FINDINGS: Right Kidney:  Length: 13.2 cm. Increased renal cortical echogenicity. 6.3 x 6.5 x 7.6 cm anechoic right lower pole renal mass with increased through transmission most consistent with a cyst. 3.6 x 5.1 x  2.1 cm anechoic right mid pole renal mass with increased through transmission most consistent with a cyst. 3.9 x 5.4 x 5.5 cm hypoechoic right upper pole renal mass with debris layering dependently without internal Doppler flow most consistent with a mildly complicated cyst. No solid mass or hydronephrosis visualized.  Left Kidney:  Length: 9.3 cm. Echogenicity within normal limits. No mass or hydronephrosis visualized.  Bladder:  Appears normal for degree of bladder distention.  IMPRESSION: 1. No obstructive uropathy. 2. Mildly complicated cyst in the upper pole of the right kidney. Follow-up renal ultrasound in 6 months recommended. 3. Multiple right renal simple cysts.   Electronically Signed   By: Elige Ko   On: 10/22/2014 09:46    ASSESSMENT AND PLAN:   [name] is a 81 y.o. male with a known history of history of coronary reactive disease status post bypass graft surgery and PCI, hypertension, AAA repair, chronic bronchitis not on home oxygen, congestive heart failure with systolic dysfunction, last known ejection fraction of 30% presents to the hospital secondary to worsening right foot big toe pain with redness, fevers, worsening diarrhea and cough.  #1 non-ST segment elevation MI-patient is asymptomatic,  -Significant known cardiac history -monitor telemetry, cardiology consult appreciated  -IV heparin drip , recycle troponins -Blood pressure is on the lower side, so not on beta blockers. Continue statin -Echocardiogram done. Known history of severe aortic stenosis - no aggressive work ups due to old age.  #2 acute gouty attack-on low-dose prednisone at home, change to 60 mg daily at this time. -Colchicine daily  #3 sepsis-blood cultures, sputum cultures and urine cultures ordered at this time. -Chest x-ray with no significant infiltrate, but has respiratory symptoms with productive cough.   likely bronchitis -We'll cover with broad-spectrum antibiotics for now pending  cultures- will taper soon. -Could be from his acute gout as well  #4 acute renal failure on CKD-likely prerenal and ATN -Renal ultrasound without significant findings. Gentle hydration for a day. - nephrology consult. -Avoid nephrotoxins. -Baseline creatinine is around 1.8 - pt had some shortness of breath and congestion, stopped IVfluids.  #5 subacute diarrhea-going on for almost a month. -No recent use of antibiotics. Stool cultures and stool for C. difficile ordered- negative -Gastroenterology consult appreciated-suggest to avoid milk product.  #6 hypertension-hypotensive in the hospital. Not on any blood pressure medications. -Monitor at this time  #7 chronic systolic CHF-not fluid overloaded. -Due to his diarrhea, volume depleted. With acute renal failure we'll hold his torsemide. -Gentle hydration and follow up -Repeat echocardiogram - some SOB, stopped IV fluids.  #8 DVT prophylaxis-on heparin drip   All the records are reviewed and case discussed with Care Management/Social Workerr. Management plans discussed with the patient, family and they are in agreement.  CODE STATUS: Yes for CPR- No intubation or ventilator.  TOTAL TIME TAKING CARE OF THIS PATIENT: 35 minutes.     POSSIBLE D/C IN 1-2 DAYS, DEPENDING ON CLINICAL CONDITION.   Altamese Dilling M.D on 10/23/2014   Between 7am to 6pm - Pager - [phone]  After 6pm go to [email or web address] - password [email or web address] [facility]  [facility]  Office  [phone]  CC: Primary care physician; Brayton El, MD

## 2014-10-23 NOTE — Progress Notes (Signed)
Patient a@o . Resting in bed. No c/o pain. Airway patent no distress noted. Heparin infusing at 10.5. Telemetry reading nsr 72. Will continue to monitor [employer]

## 2014-10-23 NOTE — Progress Notes (Signed)
[facility]  ROUNDING NOTE   Subjective:  Pt developed some shortness of breath overnight. Was taken off IVFs. Did not receive lasix. Off O2 this AM. Still having some shortness of breath. Kidney function slightly better.   Objective:  Vital signs in last 24 hours:  Temp:  [97.5 F (36.4 C)-97.9 F (36.6 C)] 97.8 F (36.6 C) (09/20 0434) Pulse Rate:  [63-70] 69 (09/20 0434) Resp:  [18-20] 20 (09/20 0434) BP: (84-113)/(59-81) 100/81 mmHg (09/20 0434) SpO2:  [98 %-100 %] 99 % (09/20 0846)  Weight change:  Filed Weights   10/18/2014 1911  Weight: 75.751 kg (167 lb)    Intake/Output: I/O last 3 completed shifts: In: 2032.1 [P.O.:240; I.V.:1492.1; IV Piggyback:300] Out: 860 [Urine:860]   Intake/Output this shift:  Total I/O In: 240 [P.O.:240] Out: -   Physical Exam: General: NAD, laying in bed  Head: Normocephalic, atraumatic. Moist oral mucosal membranes  Eyes: Anicteric  Neck: Supple, trachea midline  Lungs:  Bilateral rhonchi, normal effort  Heart: Regular rate and rhythm  Abdomen:  Soft, nontender, BS present  Extremities:  1+ peripheral edema.  Neurologic: Nonfocal, moving all four extremities  Skin: No lesions       Basic Metabolic Panel:  Recent Labs Lab 10/13/2014 1921 10/22/14 0654 10/23/14 0443  NA 135 136 133*  K 3.4* 3.3* 3.2*  CL 100* 104 102  CO2 20* 22 19*  GLUCOSE 159* 133* 169*  BUN 55* 56* 66*  CREATININE 3.02* 2.91* 2.82*  CALCIUM 8.8* 8.0* 8.0*    Liver Function Tests:  Recent Labs Lab 10/04/2014 1921  AST 81*  ALT 14*  ALKPHOS 84  BILITOT 1.3*  PROT 7.5  ALBUMIN 3.6   No results for input(s): LIPASE, AMYLASE in the last 168 hours. No results for input(s): AMMONIA in the last 168 hours.  CBC:  Recent Labs Lab 10/10/2014 1921 10/22/14 0654 10/23/14 0443  WBC 9.5 7.7 9.6  NEUTROABS 7.3*  --   --   HGB 12.4* 10.4* 10.9*  HCT 36.8* 30.7* 33.4*  MCV 94.1 94.4 94.3  PLT 165 130* 154    Cardiac  Enzymes:  Recent Labs Lab 10/15/2014 1921 10/22/2014 2348 10/22/14 0654 10/22/14 1243  TROPONINI 17.10* 19.39* 17.78* 15.62*    BNP: Invalid input(s): POCBNP  CBG: No results for input(s): GLUCAP in the last 168 hours.  Microbiology: Results for orders placed or performed during the hospital encounter of 10/11/2014  Blood Culture (routine x 2)     Status: None (Preliminary result)   Collection Time: 10/05/2014  7:21 PM  Result Value Ref Range Status   Specimen Description BLOOD LEFT ASSIST CONTROL  Final   Special Requests BOTTLES DRAWN AEROBIC AND ANAEROBIC 5CC  Final   Culture NO GROWTH 2 DAYS  Final   Report Status PENDING  Incomplete  Blood Culture (routine x 2)     Status: None (Preliminary result)   Collection Time: 10/25/2014  7:21 PM  Result Value Ref Range Status   Specimen Description BLOOD LEFT ARM  Final   Special Requests BOTTLES DRAWN AEROBIC AND ANAEROBIC 4CC  Final   Culture NO GROWTH 2 DAYS  Final   Report Status PENDING  Incomplete  Stool culture     Status: None (Preliminary result)   Collection Time: 10/17/2014 11:36 PM  Result Value Ref Range Status   Specimen Description STOOL  Final   Special Requests Normal  Final   Culture   Final    NO SALMONELLA OR SHIGELLA ISOLATED  NO CAMPYLOBACTER DETECTED No Pathogenic E. coli detected    Report Status PENDING  Incomplete  C difficile quick scan w PCR reflex     Status: None   Collection Time: 10/22/2014 11:36 PM  Result Value Ref Range Status   C Diff antigen NEGATIVE NEGATIVE Final   C Diff toxin NEGATIVE NEGATIVE Final   C Diff interpretation Negative for C. difficile  Final  Urine culture     Status: None   Collection Time: 10/22/14  5:00 AM  Result Value Ref Range Status   Specimen Description URINE, RANDOM  Final   Special Requests NONE  Final   Culture NO GROWTH 1 DAY  Final   Report Status 10/23/2014 FINAL  Final  Culture, expectorated sputum-assessment     Status: None (Preliminary result)    Collection Time: 10/22/14  5:00 AM  Result Value Ref Range Status   Specimen Description SPUTUM  Final   Special Requests Normal  Final   Sputum evaluation   Final    Sputum specimen not acceptable for testing.  Please recollect.   CALLED LISA SCOTT AT 6962 10/22/14.PMH   Report Status PENDING  Incomplete  Culture, expectorated sputum-assessment     Status: None   Collection Time: 10/22/14  6:55 PM  Result Value Ref Range Status   Specimen Description SPUTUM  Final   Special Requests Normal  Final   Sputum evaluation THIS SPECIMEN IS ACCEPTABLE FOR SPUTUM CULTURE  Final   Report Status 10/22/2014 FINAL  Final    Coagulation Studies:  Recent Labs  11/01/2014 1921  LABPROT 16.9*  INR 1.35    Urinalysis:  Recent Labs  10/22/14 0500  COLORURINE YELLOW*  LABSPEC 1.014  PHURINE 5.0  GLUCOSEU NEGATIVE  HGBUR NEGATIVE  BILIRUBINUR NEGATIVE  KETONESUR NEGATIVE  PROTEINUR NEGATIVE  NITRITE NEGATIVE  LEUKOCYTESUR NEGATIVE      Imaging: US Renal  10/22/2014   CLINICAL DATA:  Acute renal failure  EXAM: RENAL / URINARY TRACT ULTRASOUND COMPLETE  COMPARISON:  CT abdomen 12/27/2003  FINDINGS: Right Kidney:  Length: 13.2 cm. Increased renal cortical echogenicity. 6.3 x 6.5 x 7.6 cm anechoic right lower pole renal mass with increased through transmission most consistent with a cyst. 3.6 x 5.1 x 2.1 cm anechoic right mid pole renal mass with increased through transmission most consistent with a cyst. 3.9 x 5.4 x 5.5 cm hypoechoic right upper pole renal mass with debris layering dependently without internal Doppler flow most consistent with a mildly complicated cyst. No solid mass or hydronephrosis visualized.  Left Kidney:  Length: 9.3 cm. Echogenicity within normal limits. No mass or hydronephrosis visualized.  Bladder:  Appears normal for degree of bladder distention.  IMPRESSION: 1. No obstructive uropathy. 2. Mildly complicated cyst in the upper pole of the right kidney. Follow-up renal  ultrasound in 6 months recommended. 3. Multiple right renal simple cysts.   Electronically Signed   By: Kathreen Devoid   On: 10/22/2014 09:46   Dg Chest Port 1 View  10/15/2014   CLINICAL DATA:  Patient has afever, diarrhea, cough. Patient presents with congestion and coughing up yellow sputum. Hx htn, aaa, cabg, cad, bronchitis, pneumonia  EXAM: PORTABLE CHEST - 1 VIEW  COMPARISON:  07/30/2014  FINDINGS: Stable changes from previous CABG surgery. Cardiac silhouette is top-normal size. Aorta is uncoiled. No mediastinal or hilar masses or convincing adenopathy.  Lungs are clear.  No pleural effusion or pneumothorax.  Bony thorax is demineralized grossly intact.  IMPRESSION: No acute cardiopulmonary  disease.   Electronically Signed   By: Lajean Manes M.D.   On: 10/24/2014 20:15     Medications:   . heparin 1,050 Units/hr (10/23/14 0710)   . acetylcysteine  2 mL Nebulization BID  . amiodarone  200 mg Oral Weekly  . antiseptic oral rinse  7 mL Mouth Rinse q12n4p  . aspirin EC  81 mg Oral Daily  . chlorhexidine  15 mL Mouth Rinse BID  . clopidogrel  75 mg Oral Daily  . colchicine  0.6 mg Oral Daily  . finasteride  5 mg Oral Daily  . ipratropium-albuterol  3 mL Nebulization Q4H  . mometasone-formoterol  2 puff Inhalation BID  . multivitamin with minerals  1 tablet Oral Daily  . piperacillin-tazobactam (ZOSYN)  IV  3.375 g Intravenous Q12H  . pravastatin  40 mg Oral Daily  . predniSONE  60 mg Oral Q breakfast  . sodium chloride  3 mL Intravenous Q12H  . timolol  1 drop Both Eyes Daily  . vancomycin  1,000 mg Intravenous Q48H   acetaminophen **OR** acetaminophen, albuterol, ALPRAZolam, HYDROcodone-homatropine  Assessment/ Plan:  81 y.o. male with a PMHx of abdominal aortic aneurysm status post open repair 2005, status post CABG with PCI in 6394, chronic systolic heart failure ejection fraction 20-25%, hypertension, hyperlipidemia, right carotid artery disease, gout, chronic kidney disease  stage III Baseline creatinine 1.5 with an EGFR 36 who presented to [facility] with fevers, chills, cough.   1. Acute renal failure/chronic kidney disease stage III baseline Cr 1.5/egfr 36: Renal ultrasound was unremarkable for hydronephrosis. Urinalysis unremarkable. Renal failure likely multifactorial with contributions from nausea, vomiting, diarrhea, underlying heart failure, and pneumonia. -Hydration off due to shortness of breath, if sats drop today, would be ok to give one time dose of lasix 6m IV x one, will monitor status for now and avoid lasix if possible.  Renal function only marginally better.  2. Anemia of CKD: hgb 10.9 today, no indication for procrit at present.  3. Mildly complicated right renal cyst/Acquired renal cyst.Could consider following as outpt, but not a significant acute issue at the moment.   LOS: 2 [name], [name] 9/20/201610:24 AM

## 2014-10-23 NOTE — Progress Notes (Signed)
Patient is back on RA and seems to be tolerating well throughout the day.  Patient refused to work with PT today, they will try again tomorrow.  No complaints of pain today.  Alert and oriented, VSS.

## 2014-10-24 DIAGNOSIS — I5042 Chronic combined systolic (congestive) and diastolic (congestive) heart failure: Secondary | ICD-10-CM

## 2014-10-24 LAB — GLUCOSE, CAPILLARY
Glucose-Capillary: 171 mg/dL — ABNORMAL HIGH (ref 65–99)
Glucose-Capillary: 181 mg/dL — ABNORMAL HIGH (ref 65–99)
Glucose-Capillary: 167 mg/dL — ABNORMAL HIGH (ref 65–99)

## 2014-10-24 LAB — CBC
HCT: 31.6 % — ABNORMAL LOW (ref 40.0–52.0)
Hemoglobin: 10.6 g/dL — ABNORMAL LOW (ref 13.0–18.0)
MCH: 31.2 pg (ref 26.0–34.0)
MCHC: 33.5 g/dL (ref 32.0–36.0)
MCV: 93 fL (ref 80.0–100.0)
PLATELETS: 162 10*3/uL (ref 150–440)
RBC: 3.4 MIL/uL — ABNORMAL LOW (ref 4.40–5.90)
RDW: 14.8 % — AB (ref 11.5–14.5)
WBC: 9.5 10*3/uL (ref 3.8–10.6)

## 2014-10-24 LAB — BASIC METABOLIC PANEL
Anion gap: 13 (ref 5–15)
BUN: 71 mg/dL — AB (ref 6–20)
CHLORIDE: 99 mmol/L — AB (ref 101–111)
CO2: 18 mmol/L — AB (ref 22–32)
CREATININE: 2.85 mg/dL — AB (ref 0.61–1.24)
Calcium: 8 mg/dL — ABNORMAL LOW (ref 8.9–10.3)
GFR calc Af Amer: 20 mL/min — ABNORMAL LOW (ref >60)
GFR calc non Af Amer: 17 mL/min — ABNORMAL LOW (ref >60)
GLUCOSE: 163 mg/dL — AB (ref 65–99)
Potassium: 3.2 mmol/L — ABNORMAL LOW (ref 3.5–5.1)
Sodium: 130 mmol/L — ABNORMAL LOW (ref 135–145)

## 2014-10-24 LAB — HEPARIN LEVEL (UNFRACTIONATED): Heparin Unfractionated: 0.37 IU/mL (ref 0.30–0.70)

## 2014-10-24 LAB — STOOL CULTURE: SPECIAL REQUESTS: NORMAL

## 2014-10-24 MED ORDER — GUAIFENESIN 100 MG/5ML PO SOLN
5.0000 mL | ORAL | Status: DC | PRN
  Administered 2014-10-24 – 2014-10-31 (×5): 100 mg via ORAL
  Filled 2014-10-24 (×5): qty 10

## 2014-10-24 MED ORDER — CEPASTAT 14.5 MG MT LOZG: 1.0000 | LOZENGE | OROMUCOSAL | Status: DC | PRN

## 2014-10-24 MED ORDER — DOXYCYCLINE HYCLATE 100 MG PO TABS
100.0000 mg | ORAL_TABLET | Freq: Two times a day (BID) | ORAL | Status: DC
  Administered 2014-10-24 – 2014-10-30 (×13): 100 mg via ORAL
  Filled 2014-10-24 (×14): qty 1

## 2014-10-24 NOTE — Care Management (Signed)
Discussed during progression of the need to assess room air exertional SATs to determine if there will be any home 02 needs at discharge.  PT consult is pending.  Anticipate discharge home with home health nursing, PT and Aide.  Family will provide assist in the home.  No agency preference.  Referral to [facility].  Patient will need home visit within 24 hours of discharge.

## 2014-10-24 NOTE — Progress Notes (Signed)
[facility]  ROUNDING NOTE   Subjective:  CR about the same today. UOP 950cc over the past 3 shifts.  Resting comfortably.   Objective:  Vital signs in last 24 hours:  Temp:  [97.4 F (36.3 C)-97.7 F (36.5 C)] 97.4 F (36.3 C) (09/21 1157) Pulse Rate:  [73-80] 80 (09/21 1157) Resp:  [18-20] 18 (09/21 1157) BP: (108-109)/(74-80) 108/78 mmHg (09/21 1157) SpO2:  [95 %-97 %] 96 % (09/21 1157)  Weight change:  Filed Weights   10/18/2014 1911  Weight: 75.751 kg (167 lb)    Intake/Output: I/O last 3 completed shifts: In: 857.7 [P.O.:480; I.V.:327.7; IV Piggyback:50] Out: 950 [Urine:950]   Intake/Output this shift:  Total I/O In: 360 [P.O.:360] Out: 350 [Urine:350]  Physical Exam: General: NAD, laying in bed  Head: Normocephalic, atraumatic. Moist oral mucosal membranes  Eyes: Anicteric  Neck: Supple, trachea midline  Lungs:  Bilateral rhonchi, normal effort  Heart: Regular rate and rhythm  Abdomen:  Soft, nontender, BS present  Extremities:  1+ peripheral edema.  Neurologic: Nonfocal, moving all four extremities  Skin: No lesions       Basic Metabolic Panel:  Recent Labs Lab 10/15/2014 1921 10/22/14 0654 10/23/14 0443 10/24/14 0428  NA 135 136 133* 130*  K 3.4* 3.3* 3.2* 3.2*  CL 100* 104 102 99*  CO2 20* 22 19* 18*  GLUCOSE 159* 133* 169* 163*  BUN 55* 56* 66* 71*  CREATININE 3.02* 2.91* 2.82* 2.85*  CALCIUM 8.8* 8.0* 8.0* 8.0*    Liver Function Tests:  Recent Labs Lab 10/19/2014 1921  AST 81*  ALT 14*  ALKPHOS 84  BILITOT 1.3*  PROT 7.5  ALBUMIN 3.6   No results for input(s): LIPASE, AMYLASE in the last 168 hours. No results for input(s): AMMONIA in the last 168 hours.  CBC:  Recent Labs Lab 10/04/2014 1921 10/22/14 0654 10/23/14 0443 10/24/14 0428  WBC 9.5 7.7 9.6 9.5  NEUTROABS 7.3*  --   --   --   HGB 12.4* 10.4* 10.9* 10.6*  HCT 36.8* 30.7* 33.4* 31.6*  MCV 94.1 94.4 94.3 93.0  PLT 165 130* 154 162    Cardiac  Enzymes:  Recent Labs Lab 10/27/2014 1921 10/04/2014 2348 10/22/14 0654 10/22/14 1243  TROPONINI 17.10* 19.39* 17.78* 15.62*    BNP: Invalid input(s): POCBNP  CBG:  Recent Labs Lab 10/24/14 0730 10/24/14 1158 10/24/14 1624  GLUCAP 171* 181* 167*    Microbiology: Results for orders placed or performed during the hospital encounter of 10/23/2014  Blood Culture (routine x 2)     Status: None (Preliminary result)   Collection Time: 10/25/2014  7:21 PM  Result Value Ref Range Status   Specimen Description BLOOD LEFT ASSIST CONTROL  Final   Special Requests BOTTLES DRAWN AEROBIC AND ANAEROBIC 5CC  Final   Culture NO GROWTH 3 DAYS  Final   Report Status PENDING  Incomplete  Blood Culture (routine x 2)     Status: None (Preliminary result)   Collection Time: 10/19/2014  7:21 PM  Result Value Ref Range Status   Specimen Description BLOOD LEFT ARM  Final   Special Requests BOTTLES DRAWN AEROBIC AND ANAEROBIC 4CC  Final   Culture NO GROWTH 3 DAYS  Final   Report Status PENDING  Incomplete  Stool culture     Status: None   Collection Time: 10/05/2014 11:36 PM  Result Value Ref Range Status   Specimen Description STOOL  Final   Special Requests Normal  Final   Culture  Final    NO SALMONELLA OR SHIGELLA ISOLATED NO CAMPYLOBACTER DETECTED No Pathogenic E. coli detected    Report Status 10/24/2014 FINAL  Final  C difficile quick scan w PCR reflex     Status: None   Collection Time: 10/27/2014 11:36 PM  Result Value Ref Range Status   C Diff antigen NEGATIVE NEGATIVE Final   C Diff toxin NEGATIVE NEGATIVE Final   C Diff interpretation Negative for C. difficile  Final  Urine culture     Status: None   Collection Time: 10/22/14  5:00 AM  Result Value Ref Range Status   Specimen Description URINE, RANDOM  Final   Special Requests NONE  Final   Culture NO GROWTH 1 DAY  Final   Report Status 10/23/2014 FINAL  Final  Culture, expectorated sputum-assessment     Status: None (Preliminary  result)   Collection Time: 10/22/14  5:00 AM  Result Value Ref Range Status   Specimen Description SPUTUM  Final   Special Requests Normal  Final   Sputum evaluation   Final    Sputum specimen not acceptable for testing.  Please recollect.   CALLED LISA SCOTT AT 1740 10/22/14.PMH   Report Status PENDING  Incomplete  Culture, expectorated sputum-assessment     Status: None   Collection Time: 10/22/14  6:55 PM  Result Value Ref Range Status   Specimen Description SPUTUM  Final   Special Requests Normal  Final   Sputum evaluation THIS SPECIMEN IS ACCEPTABLE FOR SPUTUM CULTURE  Final   Report Status 10/22/2014 FINAL  Final  Culture, respiratory (NON-Expectorated)     Status: None (Preliminary result)   Collection Time: 10/22/14  6:55 PM  Result Value Ref Range Status   Specimen Description SPUTUM  Final   Special Requests Normal Reflexed from [id number]  Final   Gram Stain   Final    GOOD SPECIMEN - 80-90% WBCS FEW WBC SEEN MANY GRAM POSITIVE COCCI IN CLUSTERS FEW GRAM POSITIVE RODS    Culture HOLDING FOR POSSIBLE PATHOGEN  Final   Report Status PENDING  Incomplete    Coagulation Studies:  Recent Labs  10/05/2014 1921  LABPROT 16.9*  INR 1.35    Urinalysis:  Recent Labs  10/22/14 0500  COLORURINE YELLOW*  LABSPEC 1.014  PHURINE 5.0  GLUCOSEU NEGATIVE  HGBUR NEGATIVE  BILIRUBINUR NEGATIVE  KETONESUR NEGATIVE  PROTEINUR NEGATIVE  NITRITE NEGATIVE  LEUKOCYTESUR NEGATIVE      Imaging: No results found.   Medications:     . amiodarone  200 mg Oral Weekly  . aspirin EC  81 mg Oral Daily  . clopidogrel  75 mg Oral Daily  . doxycycline  100 mg Oral Q12H  . finasteride  5 mg Oral Daily  . mometasone-formoterol  2 puff Inhalation BID  . multivitamin with minerals  1 tablet Oral Daily  . pravastatin  40 mg Oral Daily  . predniSONE  60 mg Oral Q breakfast  . sodium chloride  3 mL Intravenous Q12H  . timolol  1 drop Both Eyes Daily   acetaminophen **OR**  acetaminophen, albuterol, ALPRAZolam, guaiFENesin, HYDROcodone-homatropine, phenol-menthol  Assessment/ Plan:  81 y.o. male with a PMHx of abdominal aortic aneurysm status post open repair 2005, status post CABG with PCI in 8563, chronic systolic heart failure ejection fraction 20-25%, hypertension, hyperlipidemia, right carotid artery disease, gout, chronic kidney disease stage III Baseline creatinine 1.5 with an EGFR 36 who presented to [facility] with fevers, chills, cough.   1.  Acute renal failure/chronic kidney disease stage III baseline Cr 1.5/egfr 36: Renal ultrasound was unremarkable for hydronephrosis. Urinalysis unremarkable. Renal failure likely multifactorial with contributions from nausea, vomiting, diarrhea, underlying heart failure, and pneumonia. -UOP 950cc over the past 24 hours, off hydration for now.  No urgent indication for HD.  Continue to monitor urine output and creatinine.    2. Anemia of CKD: hgb 10.4, will need to continue to monitor this as outpt.   3. Mildly complicated right renal cyst/Acquired renal cyst.Could consider following as outpt, but not a significant acute issue at the moment.   LOS: 3 [name], [name] 9/21/20164:31 PM

## 2014-10-24 NOTE — Progress Notes (Signed)
Patient alert and oriented.  Up to chair today and walked in hall with PT.  Feeling better.  Less wheezing noted, non-productive cough.    Removed foley at 1330, continue to monitor.    No complaints of pain. One of his PIV removed d/t bleeding, still has one in his left AC.  Heparin D/C and also IV abx switched to PO.

## 2014-10-24 NOTE — Evaluation (Deleted)
Physical Therapy Evaluation Patient Details Name: [name] MRN: [id number] DOB: [birth year] Today's Date: 10/24/2014   History of Present Illness     Clinical Impression  Upon evaluation, pt was alert and oriented to person, place, time and was able to respond appropriately to all commands/questions. Pt demonstrated good capacity for functional mobility and at no point during the evaluation did he complain of chest pain, dizziness, SOB. O2 saturation was monitored throughout and values stayed above 95% SpO2%. Pt was modified independent with bed mobility (supine<>sit) using the handrail to assist with the transfer. Pt required CGA with all sit<> stand transfers and was also CGA with gait. Pt required VC for maintaining his body inside of the walker when ambulating. Pt requires further skilled acute PT services in order to improve functional mobility capacity and safety. Pt will benefit from HHPT services in order to maintain his functional status. Pt would also benefit from having a home health aid to come to his house a few days a week in order to help with ADLs.      Follow Up Recommendations      Equipment Recommendations       Recommendations for Other Services       Precautions / Restrictions        Mobility  Bed Mobility                  Transfers                    Ambulation/Gait                Stairs            Wheelchair Mobility    Modified Rankin (Stroke Patients Only)       Balance                                             Pertinent Vitals/Pain      Home Living                        Prior Function                 Hand Dominance        Extremity/Trunk Assessment                         Communication      Cognition                            General Comments      Exercises        Assessment/Plan    PT Assessment    PT Diagnosis     PT  Problem List    PT Treatment Interventions     PT Goals (Current goals can be found in the Care Plan section)      Frequency     Barriers to discharge        Co-evaluation               End of Session                 Time:  -      Charges:         PT G Codes:  Isaiah Blakes, SPT 10/24/2014, 4:11 PM

## 2014-10-24 NOTE — Progress Notes (Signed)
ANTICOAGULATION CONSULT NOTE - FOLLOW UP   Pharmacy Consult for heparin gtt dosing and monitoring Indication: chest pain/ACS  No Known Allergies  Patient Measurements: Height:  (167.6 cm) Weight: 167 lb (75.751 kg) IBW/kg (Calculated) : 63.8 Heparin Dosing Weight: 75.8 kg  Vital Signs: Temp: 97.7 F (36.5 C) (09/21 0428) Temp Source: Oral (09/21 0428) BP: 109/80 mmHg (09/21 0428) Pulse Rate: 80 (09/21 0428)  Labs:  Recent Labs  10/29/2014 1921 11/01/2014 2348 10/22/14 0654 10/22/14 1243  10/23/14 0443 10/23/14 1349 10/23/14 2228 10/24/14 0428  HGB 12.4*  --  10.4*  --   --  10.9*  --   --  10.6*  HCT 36.8*  --  30.7*  --   --  33.4*  --   --  31.6*  PLT 165  --  130*  --   --  154  --   --  162  APTT 35  --   --   --   --   --   --   --   --   LABPROT 16.9*  --   --   --   --   --   --   --   --   INR 1.35  --   --   --   --   --   --   --   --   HEPARINUNFRC  --   --  0.33  --   < > 0.27* 0.45 0.42 0.37  CREATININE 3.02*  --  2.91*  --   --  2.82*  --   --  2.85*  TROPONINI 17.10* 19.39* 17.78* 15.62*  --   --   --   --   --   < > = values in this interval not displayed.  Estimated Creatinine Clearance: 12.7 mL/min (by C-G formula based on Cr of 2.85).   Medical History: Past Medical History  Diagnosis Date  . AAA (abdominal aortic aneurysm) 2005    Open AAA repair  . S/P CABG (coronary artery bypass graft) 2003    At [facility]; PCI 2004; no LHC since 2004  . Echocardiogram abnormal 11/10    Mild LVH, EF 55%, mild AI, diastolic dysfunction  . Hypertension     Unspecified  . Hyperlipidemia     Mixed  . History of orthostatic hypotension   . Carotid artery disease     Right basal ganglia CVA 11/10; CTA neck showed 90% RICA stenosis, 75-80% LICA stenosis  . Coronary artery disease     a. s/p CABG 1995 b. PCI in 2004  . Hx of bronchitis   . Pneumonia     HISTORY  . Gout   . History of AAA (abdominal aortic aneurysm) repair 07/09/2014    2008   . CKD  (chronic kidney disease)     baseline creatinine of 1.8  . Gout     Assessment: Pharmacy consulted to dose heparin gtt for chest pain/ACS. Patient was not taking anticoagulants prior to admission.   Goal of Therapy:  Heparin level 0.3-0.7 units/ml Monitor platelets by anticoagulation protocol: Yes   Plan:  Give 4000 units bolus x 1 Start heparin infusion at 900 units/hr Check anti-Xa level in 8 hours and daily while on heparin Continue to monitor H&H and platelets   9/19:  HL @ 7:00 = 0.33           HL @ 15:00 = 0.31  Will continue this pt on current rate of 900  units/hr and recheck HL on 9/20 with AM labs.   09/20 AM anti-Xa 0.27. 1100 unit bolus and increase rate to 1050 units/hr. Recheck in 8 hours.  9/20: anti Xa level 0.45. Will continue current rate and will order another heparin level for 22:00. CBC with am labs.   9/20  PM anti-Xa 0.42. No change. Recheck with CBC with AM labs.   9/21 AM anti-Xa 0.37. No change. CBC and anti-Xa with tomorrow AM labs.  McBane,Matthew S 10/24/2014,5:29 AM

## 2014-10-24 NOTE — Progress Notes (Signed)
[facility] [place] at [facility]   PATIENT NAME: [name]    MR#:  [id number]  DATE OF BIRTH:  09/18/1912  SUBJECTIVE:  CHIEF COMPLAINT:   Chief Complaint  Patient presents with  . Fever  . Cough  . Diarrhea     Feels much better today, never had any chest pain. Diarrhea is better after avoiding milk products.   Walked with PT. C/o some dry cough.  REVIEW OF SYSTEMS:  CONSTITUTIONAL: No fever, fatigue or weakness.  EYES: No blurred or double vision.  EARS, NOSE, AND THROAT: No tinnitus or ear pain.  RESPIRATORY: positive cough,no shortness of breath, wheezing or hemoptysis.  CARDIOVASCULAR: No chest pain, orthopnea, edema.  GASTROINTESTINAL: No nausea, vomiting, positive for diarrhea , no abdominal pain.  GENITOURINARY: No dysuria, hematuria.  ENDOCRINE: No polyuria, nocturia,  HEMATOLOGY: No anemia, easy bruising or bleeding SKIN: No rash or lesion. MUSCULOSKELETAL: right great toe joint pain or arthritis.   NEUROLOGIC: No tingling, numbness, had generalized weakness.  PSYCHIATRY: No anxiety or depression.   ROS  DRUG ALLERGIES:  No Known Allergies  VITALS:  Blood pressure 113/84, pulse 81, temperature 97.5 F (36.4 C), temperature source Oral, resp. rate 18, height  (1.676 m), weight 75.751 kg (167 lb), SpO2 97 %.  PHYSICAL EXAMINATION:   GENERAL: 81 y.o.-year-old patient lying in the bed with no acute distress.  EYES: Pupils equal, round, reactive to light and accommodation. No scleral icterus. Extraocular muscles intact.  HEENT: Head atraumatic, normocephalic. Oropharynx and nasopharynx clear.  NECK: Supple, no jugular venous distention. No thyroid enlargement, no tenderness.  LUNGS: Coarse rhonchi heard bilaterally over both lungs, Moving air bilaterally.  mild crackles heard. No use of accessory muscles of respiration.  CARDIOVASCULAR: S1, S2 normal. No rubs, or gallops. 3/6 systolic murmur present ABDOMEN: Soft,  nontender, appears distended. Bowel sounds present. No organomegaly or mass.  EXTREMITIES: No cyanosis, or clubbing. No pedal edema noted on the left foot. However the right foot big toe is erythematous and tender to touch with some erythema and swelling spreading onto the dorsal side of the foot NEUROLOGIC: Cranial nerves II through XII are intact. Muscle strength 5/5 in all extremities. Sensation intact. Gait not checked. No focal neurological deficits PSYCHIATRIC: The patient is alert and oriented x 3.  SKIN: No obvious rash, lesion, or ulcer. Other than the erythema on the right foot, no rash noted  Physical Exam LABORATORY PANEL:   CBC  Recent Labs Lab 10/24/14 0428  WBC 9.5  HGB 10.6*  HCT 31.6*  PLT 162   ------------------------------------------------------------------------------------------------------------------  Chemistries   Recent Labs Lab 10/28/2014 1921  10/24/14 0428  NA 135  < > 130*  K 3.4*  < > 3.2*  CL 100*  < > 99*  CO2 20*  < > 18*  GLUCOSE 159*  < > 163*  BUN 55*  < > 71*  CREATININE 3.02*  < > 2.85*  CALCIUM 8.8*  < > 8.0*  AST 81*  --   --   ALT 14*  --   --   ALKPHOS 84  --   --   BILITOT 1.3*  --   --   < > = values in this interval not displayed. ------------------------------------------------------------------------------------------------------------------  Cardiac Enzymes  Recent Labs Lab 10/22/14 0654 10/22/14 1243  TROPONINI 17.78* 15.62*   ------------------------------------------------------------------------------------------------------------------  RADIOLOGY:  No results found.  ASSESSMENT AND PLAN:   [name] is a 81 y.o. male with a known  history of history of coronary reactive disease status post bypass graft surgery and PCI, hypertension, AAA repair, chronic bronchitis not on home oxygen, congestive heart failure with systolic dysfunction, last known ejection fraction of 30% presents to the hospital  secondary to worsening right foot big toe pain with redness, fevers, worsening diarrhea and cough.  #1 non-ST segment elevation MI-patient is asymptomatic,  -Significant known cardiac history -monitor telemetry, cardiology consult appreciated  -IV heparin drip discontinued after 48 hours, recycle troponins stayed in range of 17 -Blood pressure is on the lower side, so not on beta blockers. Continue statin -Echocardiogram done= low EF. Known history of severe aortic stenosis - no aggressive work ups due to old age.  #2 acute gouty attack-on low-dose prednisone at home, change to 60 mg daily at this time. -Colchicine daily- stopped now as given enough dose in renal failure.  #3 sepsis-blood cultures, sputum cultures and urine cultures ordered at this time. -Chest x-ray with no significant infiltrate, but has respiratory symptoms with productive cough.   likely bronchitis -Given cover with broad-spectrum antibiotics for now pending cultures- Changed to Doxy. -Could be from his acute gout as well  #4 acute renal failure on CKD-likely prerenal and ATN -Renal ultrasound without significant findings. Gentle hydration for a day. - nephrology consult. -Avoid nephrotoxins. -Baseline creatinine is around 1.8 - pt had some shortness of breath and congestion, stopped IVfluids. - creatinin remained same.  #5 subacute diarrhea-going on for almost a month. -No recent use of antibiotics. Stool cultures and stool for C. difficile ordered- negative -Gastroenterology consult appreciated-suggest to avoid milk product. - improved.  #6 hypertension-hypotensive in the hospital. Not on any blood pressure medications. -Monitor at this time  #7 chronic systolic CHF-not fluid overloaded. -Due to his diarrhea, volume depleted. With acute renal failure we'll hold his torsemide. -Gentle hydration and follow up -Repeat echocardiogram - some SOB, stopped IV fluids.- discussed today with family about fluid  restriction.  #8 DVT prophylaxis-on heparin drip   All the records are reviewed and case discussed with Care Management/Social Workerr. Management plans discussed with the patient, family and they are in agreement.  CODE STATUS: Yes for CPR- No intubation or ventilator.  TOTAL TIME TAKING CARE OF THIS PATIENT: 35 minutes.    POSSIBLE D/C IN 1-2 DAYS, DEPENDING ON CLINICAL CONDITION.   Altamese Dilling M.D on 10/24/2014   Between 7am to 6pm - Pager - [phone]  After 6pm go to [email or web address] - password [email or web address] [facility]  [facility]  Office  [phone]  CC: Primary care physician; Brayton El, MD

## 2014-10-24 NOTE — Progress Notes (Signed)
Patient: [name] / Admit Date: 10/19/2014 / Date of Encounter: 10/24/2014, 10:12 AM   Subjective: No chest pain, SOB, or palpitations. BP soft precluding usage of HF medications. Has cough this morning that is mostly nonproductive.    Review of Systems: Review of Systems  Constitutional: Positive for weight loss and malaise/fatigue. Negative for fever, chills and diaphoresis.  HENT: Negative for congestion.   Eyes: Negative for discharge and redness.  Respiratory: Negative for cough, hemoptysis, sputum production, shortness of breath and wheezing.   Cardiovascular: Positive for leg swelling. Negative for chest pain, palpitations, orthopnea, claudication and PND.       Right lower extremity swelling   Gastrointestinal: Negative for heartburn, nausea, vomiting and abdominal pain.  Musculoskeletal: Negative for falls.  Skin: Negative for rash.  Neurological: Positive for weakness. Negative for sensory change, speech change and focal weakness.  Endo/Heme/Allergies: Does not bruise/bleed easily.  Psychiatric/Behavioral: The patient is not nervous/anxious.     Objective: Telemetry: NSR, 80s Physical Exam: Blood pressure 109/80, pulse 80, temperature 97.7 F (36.5 C), temperature source Oral, resp. rate 18, height  (1.676 m), weight 167 lb (75.751 kg), SpO2 96 %. Body mass index is 26.97 kg/(m^2). General: Well developed, well nourished, in no acute distress. Head: Normocephalic, atraumatic, sclera non-icteric, no xanthomas, nares are without discharge. Neck: Negative for carotid bruits. JVP not elevated. Lungs: Clear bilaterally to auscultation without wheezes, rales, or rhonchi. Breathing is unlabored. Heart: RRR S1 S2 without murmurs, rubs, or gallops.  Abdomen: Soft, non-tender, non-distended with normoactive bowel sounds. No rebound/guarding. Extremities: No clubbing or cyanosis. No edema. Distal pedal pulses are 2+ and equal bilaterally. Neuro: Alert and oriented X 3.  Moves all extremities spontaneously. Psych:  Responds to questions appropriately with a normal affect.   Intake/Output Summary (Last 24 hours) at 10/24/14 1012 Last data filed at 10/24/14 0740  Gross per 24 hour  Intake 742.68 ml  Output    450 ml  Net 292.68 ml    Inpatient Medications:  . amiodarone  200 mg Oral Weekly  . aspirin EC  81 mg Oral Daily  . clopidogrel  75 mg Oral Daily  . colchicine  0.6 mg Oral Daily  . finasteride  5 mg Oral Daily  . mometasone-formoterol  2 puff Inhalation BID  . multivitamin with minerals  1 tablet Oral Daily  . piperacillin-tazobactam (ZOSYN)  IV  3.375 g Intravenous Q12H  . pravastatin  40 mg Oral Daily  . predniSONE  60 mg Oral Q breakfast  . sodium chloride  3 mL Intravenous Q12H  . timolol  1 drop Both Eyes Daily  . vancomycin  1,000 mg Intravenous Q48H   Infusions:    Labs:  Recent Labs  10/23/14 0443 10/24/14 0428  NA 133* 130*  K 3.2* 3.2*  CL 102 99*  CO2 19* 18*  GLUCOSE 169* 163*  BUN 66* 71*  CREATININE 2.82* 2.85*  CALCIUM 8.0* 8.0*    Recent Labs  10/05/2014 1921  AST 81*  ALT 14*  ALKPHOS 84  BILITOT 1.3*  PROT 7.5  ALBUMIN 3.6    Recent Labs  October 26, 2014 1921  10/23/14 0443 10/24/14 0428  WBC 9.5  < > 9.6 9.5  NEUTROABS 7.3*  --   --   --   HGB 12.4*  < > 10.9* 10.6*  HCT 36.8*  < > 33.4* 31.6*  MCV 94.1  < > 94.3 93.0  PLT 165  < > 154 162  < > =  values in this interval not displayed.  Recent Labs  10/16/2014 1921 10/27/2014 2348 10/22/14 0654 10/22/14 1243  TROPONINI 17.10* 19.39* 17.78* 15.62*   Invalid input(s): POCBNP  Recent Labs  10/06/2014 2348  HGBA1C 5.6     Weights: Filed Weights   11/01/2014 1911  Weight: 167 lb (75.751 kg)     Radiology/Studies:  US Renal  10/22/2014   CLINICAL DATA:  Acute renal failure  EXAM: RENAL / URINARY TRACT ULTRASOUND COMPLETE  COMPARISON:  CT abdomen 12/27/2003  FINDINGS: Right Kidney:  Length: 13.2 cm. Increased renal cortical echogenicity. 6.3  x 6.5 x 7.6 cm anechoic right lower pole renal mass with increased through transmission most consistent with a cyst. 3.6 x 5.1 x 2.1 cm anechoic right mid pole renal mass with increased through transmission most consistent with a cyst. 3.9 x 5.4 x 5.5 cm hypoechoic right upper pole renal mass with debris layering dependently without internal Doppler flow most consistent with a mildly complicated cyst. No solid mass or hydronephrosis visualized.  Left Kidney:  Length: 9.3 cm. Echogenicity within normal limits. No mass or hydronephrosis visualized.  Bladder:  Appears normal for degree of bladder distention.  IMPRESSION: 1. No obstructive uropathy. 2. Mildly complicated cyst in the upper pole of the right kidney. Follow-up renal ultrasound in 6 months recommended. 3. Multiple right renal simple cysts.   Electronically Signed   By: Elige Ko   On: 10/22/2014 09:46   Dg Chest Port 1 View  10/04/2014   CLINICAL DATA:  Patient has afever, diarrhea, cough. Patient presents with congestion and coughing up yellow sputum. Hx htn, aaa, cabg, cad, bronchitis, pneumonia  EXAM: PORTABLE CHEST - 1 VIEW  COMPARISON:  07/30/2014  FINDINGS: Stable changes from previous CABG surgery. Cardiac silhouette is top-normal size. Aorta is uncoiled. No mediastinal or hilar masses or convincing adenopathy.  Lungs are clear.  No pleural effusion or pneumothorax.  Bony thorax is demineralized grossly intact.  IMPRESSION: No acute cardiopulmonary disease.   Electronically Signed   By: Amie Portland M.D.   On: 10/11/2014 20:15     Assessment and Plan  81 y.o. male with h/o CAD s/p CABG in 1995, s/p PCI/stenting in 2004, chronic systolic CHF with EF 20-25% by echo on 08/04/2012, PAF not on long term full dose anticoagulation secondary to alveolar hemorrhage, pulmonary HTN, AAA repair, prior CEA, question of prior stroke, gout, HTN, HLD, orthostatic hypotension, and CKD stage III who presented to [facility] on [facility] with a one month history of  diarrhea and 2-3 day history of increased cough and fever. Troponin was found to be 17.10-->19.39-->17.78.  1. NSTEMI/CAD: -Never with anginal symptoms -In the setting of acute diarrhea GI illness ongoing for 3-4 weeks and possible sepsis, improved now  -Has received heparin gtt x 48 hours  -Echo unchanged from prior with EF 20-25%, GR2DD, moderate aortic stenosis, moderate MR, PASP 66 mm Hg -Aspirin was held in April/May of 2016 in the setting of alveloar hemorrhage  -His son had previously restarted his Plavix 2/2 ? History of stroke -Continue Plavix -BP soft, thus not on beta blocker, start when able; no BP room for traditional afterload reduction. -Continue statin   2. Acute GI illness/sepsis: -ABX per IM -GI on board -Cultures pending  3 Pulmonary HTN: -Echo as above -Soft BP and acute on CKD preclude diuresis at this time -Start when able  4. Acute on CKD stage III: -Likely pre-renal in the setting of dehydration 2/2 diarrhea  -Continue to monitor  -  Avoid nephrotoxins   5. Chronic systolic CHF: -Not volume overloaded at this time -Echo as above -Restart HF meds when able, BP soft in the low 100's to 90's systolic precluding initiation of medications  -Would look to start Lasix first in an effort to treat his pulmonary HTN if renal function allows for this  6. PAF: -Currently in sinus rhythm -Not on long term full dose anticoagulation 2/2 history of hemorrhage -CHADSVASc at least 7 (CHF, HTN, age x 2, stroke, vascular disease)   Signed, Eula Listen, PA-C Pager: [phone] 10/24/2014, 10:12 AM  I have seen, examined and evaluated the patient this AM along with Mr. Shea Evans on AM rounds.  After reviewing all the available data and chart,  I agree with his findings, examination as well as impression recommendations.  Patient feels better with no further angina. Appears Euvolemic  BP precludes re-institution of home CHF meds -- can probably restart home diuretic to  avoid volume overloading.    Continue to ambulate & use Incentive spirometer - has mild rhonchi on chest exam that cleared with cough.    Anticipate potential d/c in 1-2 days dependent upon his mobility recovery & other medical issues.  Marykay Lex, M.D., M.S. Interventional Cardiologist   Pager # [phone]

## 2014-10-24 NOTE — Evaluation (Signed)
Physical Therapy Evaluation Patient Details Name: [name] DOBEK MRN: [id number] DOB: 1912-04-24 Today's Date: 10/24/2014   History of Present Illness  Pt admitted to hospital after c/o fever, chills, diarrhea, and a cough. Testing revealed that pt had an NSTEMI. Troponin levels also found to be elevated (peak at 19.3). Plan is to not pursue any aggressive cardiac intervention at this point due to advanced age.   Clinical Impression  Upon evaluation, pt was alert and oriented to person, place, time and was able to respond appropriately to all commands/questions. Pt demonstrated good capacity for functional mobility and at no point during the evaluation did he complain of chest pain, dizziness, SOB. O2 saturation was monitored throughout and values stayed above 95% SpO2%. Pt was modified independent with bed mobility (supine<>sit) using the handrail to assist with the transfer. Pt required CGA with all sit<> stand transfers and was also CGA with gait. Pt required VC for maintaining his body inside of the walker when ambulating. Pt requires further skilled acute PT services in order to improve functional mobility capacity and safety. Pt will benefit from HHPT services in order to maintain his functional status. Pt would also benefit from having a home health aid to come to his house a few days a week in order to help with ADLs.     Follow Up Recommendations Home health PT;Supervision - Intermittent (HH aide, HHRN)    Equipment Recommendations  Rolling walker with 5" wheels    Recommendations for Other Services       Precautions / Restrictions Precautions Precautions: Fall Restrictions Weight Bearing Restrictions: No      Mobility  Bed Mobility Overal bed mobility: Modified Independent             General bed mobility comments: no assistance given for supine<> sitting on EOB, pt did use bed rails for assistance  Transfers Overall transfer level: Needs assistance Equipment used:  Rolling walker (2 wheeled) Transfers: Sit to/from Stand Sit to Stand: Min guard         General transfer comment: Pt demonstrated good functional strength with his ability to perform sit<>stand transfer. Pt does demonstrate some impulsiveness and needed to be reminded not to stand up without PT there to guard.   Ambulation/Gait Ambulation/Gait assistance: Min guard Ambulation Distance (Feet): 100 Feet Assistive device: Rolling walker (2 wheeled)       General Gait Details: Overalll, pt's gait was good and was able to maintain a good pace throughout the 100 ft of ambulation. Pt needed to be reminded intermittenlty to keep his body inside the walker.   Stairs            Wheelchair Mobility    Modified Rankin (Stroke Patients Only)       Balance Overall balance assessment: Needs assistance Sitting-balance support: No upper extremity supported;Feet supported Sitting balance-Leahy Scale: Good Sitting balance - Comments: Pt able to maintain sitting balance with BUE in full shoudler Flexion ROM. Demonstrated a slight posterior and L lateral lean when arms not supporting his trunk in sitting.    Standing balance support: Bilateral upper extremity supported Standing balance-Leahy Scale: Fair Standing balance comment: No instances of LOB noted.  Pt states that he does not use assist device at home                             Pertinent Vitals/Pain Pain Assessment: No/denies pain    Home Living Family/patient expects to be  discharged to:: Private residence Living Arrangements: Alone Available Help at Discharge: Family Type of Home: House Home Access: Stairs to enter Entrance Stairs-Rails: Left Entrance Stairs-Number of Steps: 3 steps Home Layout: One level Home Equipment: Walker - 4 wheels;Cane - quad;Wheelchair - manual;Grab bars - tub/shower;Grab bars - toilet;Shower seat      Prior Function Level of Independence: Independent         Comments: pt  states that he still drives and performs all ADLs independently      Hand Dominance        Extremity/Trunk Assessment   Upper Extremity Assessment: Overall WFL for tasks assessed           Lower Extremity Assessment: Overall WFL for tasks assessed         Communication   Communication: HOH  Cognition Arousal/Alertness: Awake/alert Behavior During Therapy: WFL for tasks assessed/performed Overall Cognitive Status: Within Functional Limits for tasks assessed                      General Comments      Exercises        Assessment/Plan    PT Assessment Patient needs continued PT services  PT Diagnosis Difficulty walking   PT Problem List Decreased activity tolerance;Decreased balance;Decreased mobility  PT Treatment Interventions DME instruction;Gait training;Stair training;Functional mobility training;Therapeutic activities;Therapeutic exercise;Balance training;Patient/family education   PT Goals (Current goals can be found in the Care Plan section) Acute Rehab PT Goals Patient Stated Goal: to go home  PT Goal Formulation: With patient Time For Goal Achievement: 11/07/14 Potential to Achieve Goals: Good    Frequency Min 2X/week   Barriers to discharge        Co-evaluation               End of Session Equipment Utilized During Treatment: Gait belt Activity Tolerance: Patient tolerated treatment well Patient left: in bed;with bed alarm set;with family/visitor present Nurse Communication: Mobility status         Time: 1010-1040 PT Time Calculation (min) (ACUTE ONLY): 30 min   Charges:         PT G Codes:        Evaluation completed, documented by Isaiah Blakes, SPT.  This patient note, response to treatment and overall treatment plan has been reviewed and this clinician agrees with the information provided.  Kristen H. Manson Passey, PT, DPT, NCS 10/24/2014, 4:23 PM [phone]

## 2014-10-24 NOTE — Plan of Care (Signed)
Problem: Phase I Progression Outcomes Goal: Voiding-avoid urinary catheter unless indicated Outcome: Not Met (add Reason) Pt having urinary retention.  Foley in place at this time.  Pt has had history of urinary retention.   Lynnda Shields, RN

## 2014-10-25 DIAGNOSIS — R059 Cough, unspecified: Secondary | ICD-10-CM | POA: Insufficient documentation

## 2014-10-25 DIAGNOSIS — I350 Nonrheumatic aortic (valve) stenosis: Secondary | ICD-10-CM | POA: Insufficient documentation

## 2014-10-25 DIAGNOSIS — N179 Acute kidney failure, unspecified: Secondary | ICD-10-CM | POA: Insufficient documentation

## 2014-10-25 DIAGNOSIS — I5022 Chronic systolic (congestive) heart failure: Secondary | ICD-10-CM

## 2014-10-25 DIAGNOSIS — R05 Cough: Secondary | ICD-10-CM | POA: Insufficient documentation

## 2014-10-25 LAB — CBC
HCT: 32.8 % — ABNORMAL LOW (ref 40.0–52.0)
HEMOGLOBIN: 11 g/dL — AB (ref 13.0–18.0)
MCH: 31.2 pg (ref 26.0–34.0)
MCHC: 33.6 g/dL (ref 32.0–36.0)
MCV: 92.9 fL (ref 80.0–100.0)
Platelets: 160 10*3/uL (ref 150–440)
RBC: 3.53 MIL/uL — ABNORMAL LOW (ref 4.40–5.90)
RDW: 14.8 % — AB (ref 11.5–14.5)
WBC: 10 10*3/uL (ref 3.8–10.6)

## 2014-10-25 LAB — BASIC METABOLIC PANEL
Anion gap: 10 (ref 5–15)
BUN: 79 mg/dL — ABNORMAL HIGH (ref 6–20)
CALCIUM: 8.4 mg/dL — AB (ref 8.9–10.3)
CO2: 21 mmol/L — ABNORMAL LOW (ref 22–32)
CREATININE: 2.88 mg/dL — AB (ref 0.61–1.24)
Chloride: 98 mmol/L — ABNORMAL LOW (ref 101–111)
GFR calc non Af Amer: 17 mL/min — ABNORMAL LOW (ref >60)
GFR, EST AFRICAN AMERICAN: 19 mL/min — AB (ref >60)
Glucose, Bld: 172 mg/dL — ABNORMAL HIGH (ref 65–99)
Potassium: 3.2 mmol/L — ABNORMAL LOW (ref 3.5–5.1)
SODIUM: 129 mmol/L — AB (ref 135–145)

## 2014-10-25 MED ORDER — ALUM & MAG HYDROXIDE-SIMETH 200-200-20 MG/5ML PO SUSP
30.0000 mL | Freq: Four times a day (QID) | ORAL | Status: DC | PRN
  Administered 2014-10-25: 30 mL via ORAL
  Filled 2014-10-25: qty 30

## 2014-10-25 MED ORDER — HEPARIN SODIUM (PORCINE) 5000 UNIT/ML IJ SOLN
5000.0000 [IU] | Freq: Three times a day (TID) | INTRAMUSCULAR | Status: DC
  Administered 2014-10-25 – 2014-11-01 (×18): 5000 [IU] via SUBCUTANEOUS
  Filled 2014-10-25 (×18): qty 1

## 2014-10-25 MED ORDER — POTASSIUM CHLORIDE CRYS ER 20 MEQ PO TBCR
20.0000 meq | EXTENDED_RELEASE_TABLET | Freq: Two times a day (BID) | ORAL | Status: DC
  Administered 2014-10-25 – 2014-10-27 (×6): 20 meq via ORAL
  Filled 2014-10-25 (×7): qty 1

## 2014-10-25 MED ORDER — ENSURE ENLIVE PO LIQD
237.0000 mL | Freq: Two times a day (BID) | ORAL | Status: DC
  Administered 2014-10-26 – 2014-10-27 (×3): 237 mL via ORAL

## 2014-10-25 MED ORDER — TAMSULOSIN HCL 0.4 MG PO CAPS
0.4000 mg | ORAL_CAPSULE | Freq: Every day | ORAL | Status: DC
  Administered 2014-10-25 – 2014-10-30 (×6): 0.4 mg via ORAL
  Filled 2014-10-25 (×7): qty 1

## 2014-10-25 MED ORDER — SODIUM CHLORIDE 0.9 % IV SOLN
INTRAVENOUS | Status: DC
  Administered 2014-10-25: 14:00:00 via INTRAVENOUS

## 2014-10-25 NOTE — Progress Notes (Signed)
[facility] - [place] at [facility]   PATIENT NAME: [name]    MR#:  [id number]  DATE OF BIRTH:  Aug 29, 1912  SUBJECTIVE:  CHIEF COMPLAINT:   Chief Complaint  Patient presents with  . Fever  . Cough  . Diarrhea      never had any chest pain. Diarrhea is better after avoiding milk products.   Walked with PT. C/o some dry cough.   Have some bloating of stomach.  Feels very weak today- had urinary retention yesterday- so re-inserted foley.  REVIEW OF SYSTEMS:  CONSTITUTIONAL: No fever, fatigue or weakness.  EYES: No blurred or double vision.  EARS, NOSE, AND THROAT: No tinnitus or ear pain.  RESPIRATORY: positive cough,no shortness of breath, wheezing or hemoptysis.  CARDIOVASCULAR: No chest pain, orthopnea, edema.  GASTROINTESTINAL: No nausea, vomiting, positive for diarrhea , no abdominal pain.  GENITOURINARY: No dysuria, hematuria.  ENDOCRINE: No polyuria, nocturia,  HEMATOLOGY: No anemia, easy bruising or bleeding SKIN: No rash or lesion. MUSCULOSKELETAL: right great toe joint pain or arthritis.   NEUROLOGIC: No tingling, numbness, had generalized weakness.  PSYCHIATRY: No anxiety or depression.   ROS  DRUG ALLERGIES:  No Known Allergies  VITALS:  Blood pressure 101/79, pulse 66, temperature 98.4 F (36.9 C), temperature source Oral, resp. rate 18, height  (1.676 m), weight 75.751 kg (167 lb), SpO2 97 %.  PHYSICAL EXAMINATION:   GENERAL: 81 y.o.-year-old patient lying in the bed with no acute distress.  EYES: Pupils equal, round, reactive to light and accommodation. No scleral icterus. Extraocular muscles intact.  HEENT: Head atraumatic, normocephalic. Oropharynx and nasopharynx clear.  NECK: Supple, no jugular venous distention. No thyroid enlargement, no tenderness.  LUNGS: Coarse rhonchi heard bilaterally over both lungs, Moving air bilaterally.  mild crackles heard. No use of accessory muscles of respiration.   CARDIOVASCULAR: S1, S2 normal. No rubs, or gallops. 3/6 systolic murmur present ABDOMEN: Soft, nontender, appears distended. Bowel sounds present. No organomegaly or mass.  EXTREMITIES: No cyanosis, or clubbing. No pedal edema noted on the left foot. However the right foot big toe is erythematous and tender to touch with some erythema and swelling spreading onto the dorsal side of the foot NEUROLOGIC: Cranial nerves II through XII are intact. Muscle strength 5/5 in all extremities. Sensation intact. Gait not checked. No focal neurological deficits PSYCHIATRIC: The patient is alert and oriented x 3.  SKIN: No obvious rash, lesion, or ulcer. Other than the erythema on the right foot, no rash noted  Physical Exam LABORATORY PANEL:   CBC  Recent Labs Lab 10/25/14 0420  WBC 10.0  HGB 11.0*  HCT 32.8*  PLT 160   ------------------------------------------------------------------------------------------------------------------  Chemistries   Recent Labs Lab 10/28/2014 1921  10/25/14 0420  NA 135  < > 129*  K 3.4*  < > 3.2*  CL 100*  < > 98*  CO2 20*  < > 21*  GLUCOSE 159*  < > 172*  BUN 55*  < > 79*  CREATININE 3.02*  < > 2.88*  CALCIUM 8.8*  < > 8.4*  AST 81*  --   --   ALT 14*  --   --   ALKPHOS 84  --   --   BILITOT 1.3*  --   --   < > = values in this interval not displayed. ------------------------------------------------------------------------------------------------------------------  Cardiac Enzymes  Recent Labs Lab 10/22/14 0654 10/22/14 1243  TROPONINI 17.78* 15.62*   ------------------------------------------------------------------------------------------------------------------  RADIOLOGY:  No results found.  ASSESSMENT AND PLAN:   [name] is a 81 y.o. male with a known history of history of coronary reactive disease status post bypass graft surgery and PCI, hypertension, AAA repair, chronic bronchitis not on home oxygen, congestive heart  failure with systolic dysfunction, last known ejection fraction of 30% presents to the hospital secondary to worsening right foot big toe pain with redness, fevers, worsening diarrhea and cough.  #1 non-ST segment elevation MI-patient is asymptomatic,  -Significant known cardiac history -monitor telemetry, cardiology consult appreciated  -IV heparin drip discontinued after 48 hours, recycle troponins stayed in range of 17 -Blood pressure is on the lower side, so not on beta blockers. Continue statin -Echocardiogram done= low EF. Known history of severe aortic stenosis - no aggressive work ups due to old age.  #2 acute gouty attack-on low-dose prednisone at home, change to 60 mg daily at this time. -Colchicine daily- stopped now as given enough dose in renal failure.  #3 sepsis-blood cultures, sputum cultures and urine cultures ordered at this time. -Chest x-ray with no significant infiltrate, but has respiratory symptoms with productive cough.   likely bronchitis -Given cover with broad-spectrum antibiotics for now pending sputum cultures- Changed to Doxy. Blood cx negative.  #4 acute renal failure on CKD-likely prerenal and ATN -Renal ultrasound without significant findings. Gentle hydration for a day. - nephrology consult. -Avoid nephrotoxins. -Baseline creatinine is around 1.8 - pt had some shortness of breath and congestion ( 10/23/14), stopped IVfluids. - creatinin remained same. Now restarted slow IV fluids.  #5 subacute diarrhea-going on for almost a month. -No recent use of antibiotics. Stool cultures and stool for C. difficile ordered- negative -Gastroenterology consult appreciated-suggest to avoid milk product. - improved.  #6 hypertension-hypotensive in the hospital. Not on any blood pressure medications. -Monitor at this time  #7 chronic systolic CHF-not fluid overloaded. -Due to his diarrhea, volume depleted. With acute renal failure we'll hold his torsemide. -Gentle  hydration and follow up -Repeat echocardiogram - some SOB, stopped IV fluids.- discussed today with family about fluid restriction.  #8 DVT prophylaxis-on heparin subcutaneous.  #9 decreased intake ,weakness and bloating    Added maalox. Called Dietary consult.  #10 Urinary retention  Was unable to pass urine once foley removed on9/21/16   Reinserted foley. He was already on Proscar   Added flomax.  All the records are reviewed and case discussed with Care Management/Social Workerr. Management plans discussed with the patient, family and they are in agreement.  CODE STATUS: Yes for CPR- No intubation or ventilator.  TOTAL TIME TAKING CARE OF THIS PATIENT: 35 minutes.    POSSIBLE D/C IN  2-3 DAYS, DEPENDING ON CLINICAL CONDITION.   Altamese Dilling M.D on 10/25/2014   Between 7am to 6pm - Pager - [phone]  After 6pm go to [email or web address] - password [email or web address] [facility]  [facility]  Office  [phone]  CC: Primary care physician; Brayton El, MD

## 2014-10-25 NOTE — Care Management (Signed)
Patient's renal status is declining and patient endurance is markedly  Decreased and a room air sat of 83 is documented but unsure if it is at rest or exertional.  Patient admits that he is not feeling well today.  He and his family at present time, continue to verbalize committment for patient to return to home environment and provide round the clock assistance.

## 2014-10-25 NOTE — Progress Notes (Signed)
MD on the floor. Let Dr. Elisabeth Pigeon know that patients potassium and sodium are still low, that pt is still requiring foley and ronchi auscultated in lungs. Per MD, leave foley in for now, will reassess discontinuation of it closer to discharge. MD to put in orders for K and Na.

## 2014-10-25 NOTE — Progress Notes (Signed)
This RN and Mitzi Davenport, RN at bedside. Offering to walk with patient around the unit, needing to collect ambulatory SpO2 saturations. Pt declines at this time, reports he does not feel well. RN will try again later this afternoon, pt is aware.

## 2014-10-25 NOTE — Progress Notes (Signed)
[facility]  ROUNDING NOTE   Subjective:  Pt seen at bedside.   Still quite weak. Na down to 129.  Cr about the same.  UOP 1.3 liters over the past 24 hours.    Objective:  Vital signs in last 24 hours:  Temp:  [97.5 F (36.4 C)-98.4 F (36.9 C)] 98.4 F (36.9 C) (09/22 1143) Pulse Rate:  [66-81] 66 (09/22 1151) Resp:  [18-20] 18 (09/22 1143) BP: (88-121)/(68-87) 101/79 mmHg (09/22 1151) SpO2:  [83 %-100 %] 83 % (09/22 1151)  Weight change:  Filed Weights   10/31/2014 1911  Weight: 75.751 kg (167 lb)    Intake/Output: I/O last 3 completed shifts: In: 600 [P.O.:600] Out: 1350 [Urine:1350]   Intake/Output this shift:     Physical Exam: General: NAD, laying in bed  Head: Normocephalic, atraumatic. Moist oral mucosal membranes  Eyes: Anicteric  Neck: Supple, trachea midline  Lungs:  Bilateral rhonchi, normal effort  Heart: Regular rate and rhythm  Abdomen:  Soft, nontender, BS present  Extremities:  1+ peripheral edema.  Neurologic: Nonfocal, moving all four extremities  Skin: No lesions       Basic Metabolic Panel:  Recent Labs Lab 10/22/2014 1921 10/22/14 0654 10/23/14 0443 10/24/14 0428 10/25/14 0420  NA 135 136 133* 130* 129*  K 3.4* 3.3* 3.2* 3.2* 3.2*  CL 100* 104 102 99* 98*  CO2 20* 22 19* 18* 21*  GLUCOSE 159* 133* 169* 163* 172*  BUN 55* 56* 66* 71* 79*  CREATININE 3.02* 2.91* 2.82* 2.85* 2.88*  CALCIUM 8.8* 8.0* 8.0* 8.0* 8.4*    Liver Function Tests:  Recent Labs Lab 10/15/2014 1921  AST 81*  ALT 14*  ALKPHOS 84  BILITOT 1.3*  PROT 7.5  ALBUMIN 3.6   No results for input(s): LIPASE, AMYLASE in the last 168 hours. No results for input(s): AMMONIA in the last 168 hours.  CBC:  Recent Labs Lab 10/17/2014 1921 10/22/14 0654 10/23/14 0443 10/24/14 0428 10/25/14 0420  WBC 9.5 7.7 9.6 9.5 10.0  NEUTROABS 7.3*  --   --   --   --   HGB 12.4* 10.4* 10.9* 10.6* 11.0*  HCT 36.8* 30.7* 33.4* 31.6* 32.8*  MCV 94.1 94.4  94.3 93.0 92.9  PLT 165 130* 154 162 160    Cardiac Enzymes:  Recent Labs Lab 10/09/2014 1921 10/31/2014 2348 10/22/14 0654 10/22/14 1243  TROPONINI 17.10* 19.39* 17.78* 15.62*    BNP: Invalid input(s): POCBNP  CBG:  Recent Labs Lab 10/24/14 0730 10/24/14 1158 10/24/14 1624  GLUCAP 171* 181* 167*    Microbiology: Results for orders placed or performed during the hospital encounter of 10/11/2014  Blood Culture (routine x 2)     Status: None (Preliminary result)   Collection Time: 10/11/2014  7:21 PM  Result Value Ref Range Status   Specimen Description BLOOD LEFT ASSIST CONTROL  Final   Special Requests BOTTLES DRAWN AEROBIC AND ANAEROBIC 5CC  Final   Culture NO GROWTH 4 DAYS  Final   Report Status PENDING  Incomplete  Blood Culture (routine x 2)     Status: None (Preliminary result)   Collection Time: 10/25/2014  7:21 PM  Result Value Ref Range Status   Specimen Description BLOOD LEFT ARM  Final   Special Requests BOTTLES DRAWN AEROBIC AND ANAEROBIC 4CC  Final   Culture NO GROWTH 4 DAYS  Final   Report Status PENDING  Incomplete  Stool culture     Status: None   Collection Time: 10/13/2014 11:36  PM  Result Value Ref Range Status   Specimen Description STOOL  Final   Special Requests Normal  Final   Culture   Final    NO SALMONELLA OR SHIGELLA ISOLATED NO CAMPYLOBACTER DETECTED No Pathogenic E. coli detected    Report Status 10/24/2014 FINAL  Final  C difficile quick scan w PCR reflex     Status: None   Collection Time: 10/20/2014 11:36 PM  Result Value Ref Range Status   C Diff antigen NEGATIVE NEGATIVE Final   C Diff toxin NEGATIVE NEGATIVE Final   C Diff interpretation Negative for C. difficile  Final  Urine culture     Status: None   Collection Time: 10/22/14  5:00 AM  Result Value Ref Range Status   Specimen Description URINE, RANDOM  Final   Special Requests NONE  Final   Culture NO GROWTH 1 DAY  Final   Report Status 10/23/2014 FINAL  Final  Culture,  expectorated sputum-assessment     Status: None (Preliminary result)   Collection Time: 10/22/14  5:00 AM  Result Value Ref Range Status   Specimen Description SPUTUM  Final   Special Requests Normal  Final   Sputum evaluation   Final    Sputum specimen not acceptable for testing.  Please recollect.   CALLED LISA SCOTT AT 0350 10/22/14.PMH   Report Status PENDING  Incomplete  Culture, expectorated sputum-assessment     Status: None   Collection Time: 10/22/14  6:55 PM  Result Value Ref Range Status   Specimen Description SPUTUM  Final   Special Requests Normal  Final   Sputum evaluation THIS SPECIMEN IS ACCEPTABLE FOR SPUTUM CULTURE  Final   Report Status 10/22/2014 FINAL  Final  Culture, respiratory (NON-Expectorated)     Status: None (Preliminary result)   Collection Time: 10/22/14  6:55 PM  Result Value Ref Range Status   Specimen Description SPUTUM  Final   Special Requests Normal Reflexed from [id number]  Final   Gram Stain   Final    GOOD SPECIMEN - 80-90% WBCS FEW WBC SEEN MANY GRAM POSITIVE COCCI IN CLUSTERS FEW GRAM POSITIVE RODS    Culture   Final    LIGHT GROWTH GRAM NEGATIVE RODS MOLD SENT TO STATE FOR IDENTIFICATION IDENTIFICATION AND SUSCEPTIBILITIES TO FOLLOW    Report Status PENDING  Incomplete    Coagulation Studies: No results for input(s): LABPROT, INR in the last 72 hours.  Urinalysis: No results for input(s): COLORURINE, LABSPEC, PHURINE, GLUCOSEU, HGBUR, BILIRUBINUR, KETONESUR, PROTEINUR, UROBILINOGEN, NITRITE, LEUKOCYTESUR in the last 72 hours.  Invalid input(s): APPERANCEUR    Imaging: No results found.   Medications:     . amiodarone  200 mg Oral Weekly  . aspirin EC  81 mg Oral Daily  . clopidogrel  75 mg Oral Daily  . doxycycline  100 mg Oral Q12H  . feeding supplement (ENSURE ENLIVE)  237 mL Oral BID BM  . finasteride  5 mg Oral Daily  . mometasone-formoterol  2 puff Inhalation BID  . multivitamin with minerals  1 tablet Oral Daily  .  potassium chloride  20 mEq Oral BID  . pravastatin  40 mg Oral Daily  . predniSONE  60 mg Oral Q breakfast  . sodium chloride  3 mL Intravenous Q12H  . timolol  1 drop Both Eyes Daily   acetaminophen **OR** acetaminophen, albuterol, ALPRAZolam, alum & mag hydroxide-simeth, guaiFENesin, HYDROcodone-homatropine, phenol-menthol  Assessment/ Plan:  81 y.o. male with a PMHx of abdominal aortic aneurysm status  post open repair 2005, status post CABG with PCI in 3009, chronic systolic heart failure ejection fraction 20-25%, hypertension, hyperlipidemia, right carotid artery disease, gout, chronic kidney disease stage III Baseline creatinine 1.5 with an EGFR 36 who [facility] to [facility] with fevers, chills, cough.   1. Acute renal failure/chronic kidney disease stage III baseline Cr 1.5/egfr 36: Renal ultrasound was unremarkable for hydronephrosis. Urinalysis unremarkable. Renal failure likely multifactorial with contributions from nausea, vomiting, diarrhea, underlying heart failure, and pneumonia. -Cr hasn't significantly improved, good UOP noted however, Na dropping.  Will consider restarting fluids at a very slow rate at present.    2. Anemia of CKD: hgb up to 11, on indication for epogen.  3. Mildly complicated right renal cyst/Acquired renal cyst.Could consider following as outpt, but not a significant acute issue at the moment.  4.  Hyponatremia: na dropping, will start 0.9 NS at a slow rate.     LOS: 4 [name], [name] 9/22/20161:27 PM

## 2014-10-25 NOTE — Progress Notes (Addendum)
PT Cancellation Note  Patient Details Name: [name] MRN: [id number] DOB: August 11, 1912   Cancelled Treatment:    Reason Eval/Treat Not Completed: Fatigue/lethargy limiting ability to participate (Pt states that he does not have the energy to do anything today. )   Georgina Peer 10/25/2014, 1:07 PM   This patient note, response to treatment and overall treatment plan has been reviewed and this clinician agrees with the information provided.  Kristen H. Manson Passey, PT, DPT, NCS 10/25/2014, 2:48 PM [phone]

## 2014-10-25 NOTE — Progress Notes (Signed)
Patient foley was removed during day shift. Patient inability to void after 9 hours and bladder scan of 350mL was reported to Dr. Clint Guy. A new urinary foley catheter was reinserted per order. Patient denied any pain, N&V hemodynamically stable with VS WDL for patient. Patient rested for most of the night.

## 2014-10-25 NOTE — Progress Notes (Signed)
Patient: [name] / Admit Date: 10/04/2014 / Date of Encounter: 10/25/2014, 8:54 AM   Subjective: Cough, nonproductive, nothing coming up, mild SOB at rest Crackles with deep inspiration, Creatinine  2.8 Talk with family at bedside. Gout improving  Review of Systems: ROS Constitutional: weak HENT: Negative for congestion.  Eyes: Negative for blurred vision, discharge and redness.  Respiratory: Positive for cough and shortness of breath. Negative for hemoptysis, sputum production and wheezing.  Cardiovascular: Negative for chest pain, palpitations, orthopnea, claudication, leg swelling and PND.  Skin: Negative for rash.  Neurological: Positive for weakness. Negative for headaches.   Objective: Telemetry: NSR with LBBB Physical Exam: Blood pressure 121/87, pulse 81, temperature 97.7 F (36.5 C), temperature source Oral, resp. rate 20, height  (1.676 m), weight 167 lb (75.751 kg), SpO2 100 %. Body mass index is 26.97 kg/(m^2). General: Well developed, well nourished, in no acute distress. Head: Normocephalic, atraumatic, sclera non-icteric, no xanthomas, nares are without discharge. Neck: Negative for carotid bruits. JVP not elevated. Lungs: Clear bilaterally to auscultation without wheezes, rales, or rhonchi. Breathing is unlabored. Heart: RRR S1 S2 without murmurs, rubs, or gallops.  Abdomen: Soft, non-tender, non-distended with normoactive bowel sounds. No rebound/guarding. Extremities: No clubbing or cyanosis. No edema. Distal pedal pulses are 2+ and equal bilaterally. Neuro: Alert and oriented X 3. Moves all extremities spontaneously. Psych:  Responds to questions appropriately with a normal affect.   Intake/Output Summary (Last 24 hours) at 10/25/14 0854 Last data filed at 10/25/14 0523  Gross per 24 hour  Intake    360 ml  Output   1050 ml  Net   -690 ml    Inpatient Medications:  . amiodarone  200 mg Oral Weekly  . aspirin EC  81 mg Oral Daily  .  clopidogrel  75 mg Oral Daily  . doxycycline  100 mg Oral Q12H  . finasteride  5 mg Oral Daily  . mometasone-formoterol  2 puff Inhalation BID  . multivitamin with minerals  1 tablet Oral Daily  . pravastatin  40 mg Oral Daily  . predniSONE  60 mg Oral Q breakfast  . sodium chloride  3 mL Intravenous Q12H  . timolol  1 drop Both Eyes Daily   Infusions:    Labs:  Recent Labs  10/24/14 0428 10/25/14 0420  NA 130* 129*  K 3.2* 3.2*  CL 99* 98*  CO2 18* 21*  GLUCOSE 163* 172*  BUN 71* 79*  CREATININE 2.85* 2.88*  CALCIUM 8.0* 8.4*   No results for input(s): AST, ALT, ALKPHOS, BILITOT, PROT, ALBUMIN in the last 72 hours.  Recent Labs  10/24/14 0428 10/25/14 0420  WBC 9.5 10.0  HGB 10.6* 11.0*  HCT 31.6* 32.8*  MCV 93.0 92.9  PLT 162 160    Recent Labs  10/22/14 1243  TROPONINI 15.62*   Invalid input(s): POCBNP No results for input(s): HGBA1C in the last 72 hours.   Weights: Filed Weights   11/01/2014 1911  Weight: 167 lb (75.751 kg)     Radiology/Studies:  US Renal  10/22/2014   CLINICAL DATA:  Acute renal failure  EXAM: RENAL / URINARY TRACT ULTRASOUND COMPLETE  COMPARISON:  CT abdomen 12/27/2003  FINDINGS: Right Kidney:  Length: 13.2 cm. Increased renal cortical echogenicity. 6.3 x 6.5 x 7.6 cm anechoic right lower pole renal mass with increased through transmission most consistent with a cyst. 3.6 x 5.1 x 2.1 cm anechoic right mid pole renal mass with increased through transmission most  consistent with a cyst. 3.9 x 5.4 x 5.5 cm hypoechoic right upper pole renal mass with debris layering dependently without internal Doppler flow most consistent with a mildly complicated cyst. No solid mass or hydronephrosis visualized.  Left Kidney:  Length: 9.3 cm. Echogenicity within normal limits. No mass or hydronephrosis visualized.  Bladder:  Appears normal for degree of bladder distention.  IMPRESSION: 1. No obstructive uropathy. 2. Mildly complicated cyst in the upper pole  of the right kidney. Follow-up renal ultrasound in 6 months recommended. 3. Multiple right renal simple cysts.   Electronically Signed   By: Elige Ko   On: 10/22/2014 09:46   Dg Chest Port 1 View  02-Nov-2014   CLINICAL DATA:  Patient has afever, diarrhea, cough. Patient presents with congestion and coughing up yellow sputum. Hx htn, aaa, cabg, cad, bronchitis, pneumonia  EXAM: PORTABLE CHEST - 1 VIEW  COMPARISON:  07/30/2014  FINDINGS: Stable changes from previous CABG surgery. Cardiac silhouette is top-normal size. Aorta is uncoiled. No mediastinal or hilar masses or convincing adenopathy.  Lungs are clear.  No pleural effusion or pneumothorax.  Bony thorax is demineralized grossly intact.  IMPRESSION: No acute cardiopulmonary disease.   Electronically Signed   By: Amie Portland M.D.   On: 10/24/2014 20:15     Assessment and Plan  81 y.o. male with h/o CAD s/p CABG in 1995, s/p PCI/stenting in 2004, chronic systolic CHF with EF 20-25% by echo on 08/04/2012, PAF not on long term full dose anticoagulation secondary to alveolar hemorrhage, pulmonary HTN, AAA repair, prior CEA, question of prior stroke, gout, HTN, HLD, orthostatic hypotension, and CKD stage III who presented to [facility] on 9/18 with a one month history of diarrhea and 2-3 day history of increased cough and fever. Troponin was found to be 17.10-->19.39-->17.78.  1. NSTEMI/CAD: -No anginal symptoms Secondary to underlying CAD, aortic valve stenosis, BRONCHITIS, GI illlness ---medical management at this time  2. Acute GI illness/sepsis: -ABX per IM   3 Pulmonary HTN: Not on diuretic secondary to renal failure  4. Acute on CKD stage III: ATN, in setting of infection Renal following  5. Chronic systolic CHF: Most cardiac meds on hold, Not on diuretics secondary to renal failure  6. PAF: -Currently in sinus rhythm -Not on long term full dose anticoagulation 2/2 history of hemorrhage -CHADSVASc at least 7 (CHF, HTN, age x 2,  stroke, vascular disease)  7: Cough/bronchitis ABX  Signed, Dossie Arbour, MD 10/25/2014, 8:54 AM

## 2014-10-25 NOTE — Plan of Care (Signed)
Problem: Phase I Progression Outcomes Goal: Voiding-avoid urinary catheter unless indicated Outcome: Not Met (add Reason) Patient unable to void 9 hours after foley removal. Required another foley insertion

## 2014-10-25 NOTE — Progress Notes (Signed)
Patient requested to get up to bathroom. Asked to be given 25-19min of privacy. Pt educated on safety and fall prevention while in hospital but patient continues to decline aide or RN present while toileting. Pt educated on bathroom cord and verbalizes that he will pull it when he is done and that he will not get up without an aide or RN present. Aide is aware that patient is toileting.

## 2014-10-26 ENCOUNTER — Encounter: Admission: EM | Disposition: E | Payer: Self-pay | Source: Home / Self Care | Attending: Internal Medicine

## 2014-10-26 HISTORY — PX: PERIPHERAL VASCULAR CATHETERIZATION: SHX172C

## 2014-10-26 LAB — BASIC METABOLIC PANEL
Anion gap: 8 (ref 5–15)
BUN: 91 mg/dL — AB (ref 6–20)
CHLORIDE: 98 mmol/L — AB (ref 101–111)
CO2: 22 mmol/L (ref 22–32)
CREATININE: 3.03 mg/dL — AB (ref 0.61–1.24)
Calcium: 8.2 mg/dL — ABNORMAL LOW (ref 8.9–10.3)
GFR calc Af Amer: 18 mL/min — ABNORMAL LOW (ref >60)
GFR calc non Af Amer: 16 mL/min — ABNORMAL LOW (ref >60)
GLUCOSE: 157 mg/dL — AB (ref 65–99)
POTASSIUM: 4.2 mmol/L (ref 3.5–5.1)
Sodium: 128 mmol/L — ABNORMAL LOW (ref 135–145)

## 2014-10-26 LAB — CBC
HCT: 33.3 % — ABNORMAL LOW (ref 40.0–52.0)
HEMOGLOBIN: 11.2 g/dL — AB (ref 13.0–18.0)
MCH: 31.4 pg (ref 26.0–34.0)
MCHC: 33.7 g/dL (ref 32.0–36.0)
MCV: 93.2 fL (ref 80.0–100.0)
PLATELETS: 151 10*3/uL (ref 150–440)
RBC: 3.58 MIL/uL — AB (ref 4.40–5.90)
RDW: 14.8 % — ABNORMAL HIGH (ref 11.5–14.5)
WBC: 10.5 10*3/uL (ref 3.8–10.6)

## 2014-10-26 LAB — PHOSPHORUS: Phosphorus: 4 mg/dL (ref 2.5–4.6)

## 2014-10-26 LAB — PROTEIN / CREATININE RATIO, URINE
CREATININE, URINE: 109 mg/dL
PROTEIN CREATININE RATIO: 0.37 mg/mg{creat} — AB (ref 0.00–0.15)
TOTAL PROTEIN, URINE: 40 mg/dL

## 2014-10-26 SURGERY — DIALYSIS/PERMA CATHETER REPAIR
Anesthesia: LOCAL | Laterality: Right

## 2014-10-26 MED ORDER — LIDOCAINE-PRILOCAINE 2.5-2.5 % EX CREA: 1.0000 "application " | TOPICAL_CREAM | CUTANEOUS | Status: DC | PRN

## 2014-10-26 MED ORDER — ALTEPLASE 2 MG IJ SOLR
2.0000 mg | Freq: Once | INTRAMUSCULAR | Status: DC | PRN
  Filled 2014-10-26: qty 2

## 2014-10-26 MED ORDER — SODIUM CHLORIDE 0.9 % IV SOLN: 100.0000 mL | INTRAVENOUS | Status: DC | PRN

## 2014-10-26 MED ORDER — BOOST / RESOURCE BREEZE PO LIQD
1.0000 | Freq: Three times a day (TID) | ORAL | Status: DC
  Administered 2014-10-26 – 2014-10-28 (×6): 1 via ORAL

## 2014-10-26 MED ORDER — ATORVASTATIN CALCIUM 20 MG PO TABS
40.0000 mg | ORAL_TABLET | Freq: Every day | ORAL | Status: DC
  Administered 2014-10-26 – 2014-10-30 (×5): 40 mg via ORAL
  Filled 2014-10-26 (×5): qty 2

## 2014-10-26 MED ORDER — HEPARIN SODIUM (PORCINE) 1000 UNIT/ML DIALYSIS
1000.0000 [IU] | INTRAMUSCULAR | Status: DC | PRN
  Administered 2014-10-26 – 2014-10-28 (×2): 3600 [IU] via INTRAVENOUS_CENTRAL
  Filled 2014-10-26 (×3): qty 1

## 2014-10-26 MED ORDER — PENTAFLUOROPROP-TETRAFLUOROETH EX AERO: 1.0000 "application " | INHALATION_SPRAY | CUTANEOUS | Status: DC | PRN

## 2014-10-26 MED ORDER — LIDOCAINE HCL (PF) 1 % IJ SOLN
INTRAMUSCULAR | Status: DC | PRN
  Administered 2014-10-26: 5 mL via INTRADERMAL

## 2014-10-26 MED ORDER — LIDOCAINE HCL (PF) 1 % IJ SOLN
5.0000 mL | INTRAMUSCULAR | Status: DC | PRN
  Filled 2014-10-26: qty 5

## 2014-10-26 SURGICAL SUPPLY — 3 items
BIOPATCH WHT 1IN DISK W/4.0 H (GAUZE/BANDAGES/DRESSINGS) ×2 IMPLANT
KIT DIALYSIS CATH TRI 30X13 (CATHETERS) ×2 IMPLANT
PACK ANGIOGRAPHY (CUSTOM PROCEDURE TRAY) ×2 IMPLANT

## 2014-10-26 NOTE — Progress Notes (Signed)
Consents for dialysis catheter placement, hemodialysis procedure and [facility] dialysis have been signed by the patient and placed in his chart.

## 2014-10-26 NOTE — Consult Note (Signed)
[facility] Vascular Consult Note  MRN : [id number]  [name] is a 81 y.o. (Jul 02, 1912) male who presents with chief complaint of  Chief Complaint  Patient presents with  . Fever  . Cough  . Diarrhea  .  History of Present Illness: Patient is admitted to the hospital with an MI and has developed acute renal failure.  The patient reports swelling and decreased urine output.  He is still awake and alert and has good cognition.  The patient is critically ill with these multiple issues. The nephrology service has decided to initiate dialysis at this time, and we are asked to place a temporary dialysis catheter for immediate dialysis use.    Current Facility-Administered Medications  Medication Dose Route Frequency Provider Last Rate Last Dose  . acetaminophen (TYLENOL) tablet 650 mg  650 mg Oral Q6H PRN Enid Baas, MD       Or  . acetaminophen (TYLENOL) suppository 650 mg  650 mg Rectal Q6H PRN Enid Baas, MD      . albuterol (PROVENTIL) (2.5 MG/3ML) 0.083% nebulizer solution 3 mL  3 mL Inhalation Q6H PRN Enid Baas, MD      . ALPRAZolam Prudy Feeler) tablet 1 mg  1 mg Oral QHS PRN Enid Baas, MD      . alum & mag hydroxide-simeth (MAALOX/MYLANTA) 200-200-20 MG/5ML suspension 30 mL  30 mL Oral Q6H PRN Altamese Dilling, MD   30 mL at 10/25/14 1446  . amiodarone (PACERONE) tablet 200 mg  200 mg Oral Weekly Enid Baas, MD   200 mg at 10/22/14 0956  . aspirin EC tablet 81 mg  81 mg Oral Daily Enid Baas, MD   81 mg at 10/11/2014 0847  . clopidogrel (PLAVIX) tablet 75 mg  75 mg Oral Daily Enid Baas, MD   75 mg at 10/18/2014 0847  . doxycycline (VIBRA-TABS) tablet 100 mg  100 mg Oral Q12H Altamese Dilling, MD   100 mg at 10/10/2014 0846  . feeding supplement (ENSURE ENLIVE) (ENSURE ENLIVE) liquid 237 mL  237 mL Oral BID BM Altamese Dilling, MD   237 mL at 10/30/2014 1000  . finasteride (PROSCAR) tablet 5 mg  5 mg  Oral Daily Enid Baas, MD   5 mg at 10/08/2014 0846  . guaiFENesin (ROBITUSSIN) 100 MG/5ML solution 100 mg  5 mL Oral Q4H PRN Altamese Dilling, MD   100 mg at 10/24/14 2350  . heparin injection 5,000 Units  5,000 Units Subcutaneous 3 times per day Altamese Dilling, MD   5,000 Units at 10/25/2014 0546  . HYDROcodone-homatropine (HYCODAN) 5-1.5 MG/5ML syrup 5 mL  5 mL Oral Q6H PRN Enid Baas, MD      . mometasone-formoterol (DULERA) 100-5 MCG/ACT inhaler 2 puff  2 puff Inhalation BID Enid Baas, MD   2 puff at 10/17/2014 0848  . multivitamin with minerals tablet 1 tablet  1 tablet Oral Daily Enid Baas, MD   1 tablet at 10/27/2014 0846  . phenol-menthol (CEPASTAT) lozenge 1 lozenge  1 lozenge Buccal PRN Altamese Dilling, MD      . potassium chloride SA (K-DUR,KLOR-CON) CR tablet 20 mEq  20 mEq Oral BID Altamese Dilling, MD   20 mEq at 10/09/2014 0846  . pravastatin (PRAVACHOL) tablet 40 mg  40 mg Oral Daily Enid Baas, MD   40 mg at 10/24/2014 0846  . predniSONE (DELTASONE) tablet 60 mg  60 mg Oral Q breakfast Enid Baas, MD   60 mg at 10/29/2014 0846  . sodium  chloride 0.9 % injection 3 mL  3 mL Intravenous Q12H Enid Baas, MD   3 mL at 10/25/14 0948  . tamsulosin (FLOMAX) capsule 0.4 mg  0.4 mg Oral Daily Altamese Dilling, MD   0.4 mg at 10/15/2014 0847  . timolol (TIMOPTIC) 0.5 % ophthalmic solution 1 drop  1 drop Both Eyes Daily Enid Baas, MD   1 drop at 10/22/2014 0847    Past Medical History  Diagnosis Date  . AAA (abdominal aortic aneurysm) 2005    Open AAA repair  . S/P CABG (coronary artery bypass graft) 2003    At [facility]; PCI 2004; no LHC since 2004  . Echocardiogram abnormal 11/10    Mild LVH, EF 55%, mild AI, diastolic dysfunction  . Hypertension     Unspecified  . Hyperlipidemia     Mixed  . History of orthostatic hypotension   . Carotid artery disease     Right basal ganglia CVA 11/10; CTA neck showed 90% RICA  stenosis, 75-80% LICA stenosis  . Coronary artery disease     a. s/p CABG 1995 b. PCI in 2004  . Hx of bronchitis   . Pneumonia     HISTORY  . Gout   . History of AAA (abdominal aortic aneurysm) repair 07/09/2014    2008   . CKD (chronic kidney disease)     baseline creatinine of 1.8  . Gout     Past Surgical History  Procedure Laterality Date  . Coronary artery bypass graft  2003  . Abdominal aortic aneurysm repair  2005    Social History Social History  Substance Use Topics  . Smoking status: Never Smoker   . Smokeless tobacco: Never Used  . Alcohol Use: No  No IVDU  Family History Family History  Problem Relation Age of Onset  . Family history unknown: Yes  Son with carotid disease No known clotting disorders or bleeding disorders  No Known Allergies   REVIEW OF SYSTEMS (Negative unless checked)  Constitutional: Weight loss  Fever  Chills Cardiac: Chest pain   Chest pressure   Palpitations   Shortness of breath when laying flat   Shortness of breath at rest   Shortness of breath with exertion. Vascular:  Pain in legs with walking   Pain in legs at rest   Pain in legs when laying flat   Claudication   Pain in feet when walking  Pain in feet at rest  Pain in feet when laying flat   History of DVT   Phlebitis   Swelling in legs   Varicose veins   Non-healing ulcers Pulmonary:   Uses home oxygen   Productive cough   Hemoptysis   Wheeze  COPD   Asthma Neurologic:  Dizziness  Blackouts   Seizures   History of stroke   History of TIA  Aphasia   Temporary blindness   Dysphagia   Weakness or numbness in arms   Weakness or numbness in legs Musculoskeletal:  Arthritis   Joint swelling   Joint pain   Low back pain Hematologic:  Easy bruising  Easy bleeding   Hypercoagulable state   Anemic  Hepatitis Gastrointestinal:  Blood in stool   Vomiting blood  Gastroesophageal  reflux/heartburn   Difficulty swallowing. Genitourinary:  Chronic kidney disease   Difficult urination  Frequent urination  Burning with urination   Blood in urine Skin:  Rashes   Ulcers   Wounds Psychological:  History of anxiety    History of major depression.  Unable to obtain the review of systems due to the patient's severe systemic illness and altered mental status.       Physical Examination  Filed Vitals:   10/23/2014 1215 10/07/2014 1250 10/25/2014 1255 11/01/2014 1335  BP: 110/77   113/82  Pulse: 67   71  Temp: 97.5 F (36.4 C)     TempSrc: Oral     Resp: 18   14  Height:      Weight:      SpO2: 100% 100% 95% 100%   Body mass index is 26.97 kg/(m^2). Gen: WD/WN, appears younger than stated age Head: [place]/AT, No temporalis wasting Ear/Nose/Throat: Hearing grossly intact, nares w/o erythema or drainage,  Eyes: PERRLA, EOMI.  Neck: Supple, no nuchal rigidity.  No bruit or JVD.  Pulmonary:  Good air movement, clear to auscultation bilaterally.  Cardiac: RRR, normal S1, S2, no Murmurs, rubs or gallops.  Gastrointestinal: soft, non-tender/non-distended. No guarding/reflex.  Musculoskeletal: M/S 5/5 throughout.  Extremities without ischemic changes.  No deformity or atrophy. Moderate Edema in the lower extremities bilaterally Neurologic: awake and alert, moves all for extremities, follows commands, normal strength and tone Psychiatric: Difficult to assess due to the severity of patient's illness. Dermatologic: No rashes or ulcers noted.   Lymph : No Cervical, Axillary, or Inguinal lymphadenopathy.      CBC Lab Results  Component Value Date   WBC 10.5 10/08/2014   HGB 11.2* 10/31/2014   HCT 33.3* 10/09/2014   MCV 93.2 10/20/2014   PLT 151 10/04/2014    BMET    Component Value Date/Time   NA 128* 10/16/2014 0638   NA 133* 05/20/2014 0417   NA 142 04/06/2013 0935   K 4.2 10/12/2014 0638   K 4.2 05/20/2014 0417   CL 98* 10/13/2014 0638   CL  101 05/20/2014 0417   CO2 22 10/15/2014 0638   CO2 27 05/20/2014 0417   GLUCOSE 157* 10/14/2014 0638   GLUCOSE 125* 05/20/2014 0417   GLUCOSE 67 04/06/2013 0935   BUN 91* 10/22/2014 0638   BUN 39* 05/20/2014 0417   BUN 51* 04/06/2013 0935   CREATININE 3.03* 10/28/2014 0638   CREATININE 1.56* 05/20/2014 0417   CALCIUM 8.2* 10/10/2014 0638   CALCIUM 8.4* 05/20/2014 0417   GFRNONAA 16* 10/12/2014 0638   GFRNONAA 36* 05/20/2014 0417   GFRAA 18* 10/27/2014 0638   GFRAA 42* 05/20/2014 0417   Estimated Creatinine Clearance: 12 mL/min (by C-G formula based on Cr of 3.03).  COAG Lab Results  Component Value Date   INR 1.35 10/20/2014   INR 1.0 05/18/2014    Radiology US Renal  10/22/2014   CLINICAL DATA:  Acute renal failure  EXAM: RENAL / URINARY TRACT ULTRASOUND COMPLETE  COMPARISON:  CT abdomen 12/27/2003  FINDINGS: Right Kidney:  Length: 13.2 cm. Increased renal cortical echogenicity. 6.3 x 6.5 x 7.6 cm anechoic right lower pole renal mass with increased through transmission most consistent with a cyst. 3.6 x 5.1 x 2.1 cm anechoic right mid pole renal mass with increased through transmission most consistent with a cyst. 3.9 x 5.4 x 5.5 cm hypoechoic right upper pole renal mass with debris layering dependently without internal Doppler flow most consistent with a mildly complicated cyst. No solid mass or hydronephrosis visualized.  Left Kidney:  Length: 9.3 cm. Echogenicity within normal limits. No mass or hydronephrosis visualized.  Bladder:  Appears normal for degree of bladder distention.  IMPRESSION: 1. No obstructive uropathy. 2. Mildly complicated cyst in the upper pole  of the right kidney. Follow-up renal ultrasound in 6 months recommended. 3. Multiple right renal simple cysts.   Electronically Signed   By: Elige Ko   On: 10/22/2014 09:46   Dg Chest Port 1 View  10/27/2014   CLINICAL DATA:  Patient has afever, diarrhea, cough. Patient presents with congestion and coughing up  yellow sputum. Hx htn, aaa, cabg, cad, bronchitis, pneumonia  EXAM: PORTABLE CHEST - 1 VIEW  COMPARISON:  07/30/2014  FINDINGS: Stable changes from previous CABG surgery. Cardiac silhouette is top-normal size. Aorta is uncoiled. No mediastinal or hilar masses or convincing adenopathy.  Lungs are clear.  No pleural effusion or pneumothorax.  Bony thorax is demineralized grossly intact.  IMPRESSION: No acute cardiopulmonary disease.   Electronically Signed   By: Amie Portland M.D.   On: 11/01/2014 20:15      Assessment/Plan 1. ARF, will need temp cath today 2. MI, supportive treatment, poor perfusion likely a reason for his ARF as well. 3. Carotid stenosis, stable.  S/P CEA about 5 years ago.  Was in office last month for follow up and will continue to follow as outpatient.   We will proceed with temporary dialysis catheter placement at this time.  Risks and benefits discussed with patient and/or family, and the catheter will be placed to allow immediate initiation of dialysis.  If the patient's renal function does not improve throughout the hospital course, we will be happy to place a tunneled dialysis catheter for long term use prior to discharge.     DEW,JASON, MD  10/31/2014 2:17 PM

## 2014-10-26 NOTE — Op Note (Signed)
  OPERATIVE NOTE   PROCEDURE: 1. Ultrasound guidance for vascular access right femoral vein 2. Placement of a 30 cm Trialysis type dialysis catheter right femoral vein  PRE-OPERATIVE DIAGNOSIS: 1. Acute renal failure 2. MI 3. Carotid disease, stable  POST-OPERATIVE DIAGNOSIS: Same  SURGEON: Festus Barren, MD  ASSISTANT(S): None  ANESTHESIA: local  ESTIMATED BLOOD LOSS: Minimal   FINDING(S): 1. None  SPECIMEN(S): None  INDICATIONS:  Patient is a 81 yo WM  who presents with a myocardial infarction and has developed Acute renal failure.  He needs a dialysis catheter placed for immediate dialysis.  Risks and benefits were discussed, and informed consent was obtained..  DESCRIPTION: After obtaining full informed written consent, the patient was laid flat in the bed. The right groin was sterilely prepped and draped in a sterile surgical field was created. The right femoral vein was visualized with ultrasound and found to be widely patent. It was then accessed under direct guidance without difficulty with a Seldinger needle and a permanent image was recorded. A J-wire was then placed. After skin nick and dilatation, a 30 cm long triple lumen dialysis catheter was placed over the wire and the wire was removed. The lumens withdrew dark red nonpulsatile blood and flushed easily with sterile saline. The catheter was secured to the skin with 3 nylon sutures. Sterile dressing was placed.  COMPLICATIONS: None  CONDITION: Stable  DEW,JASON 10/25/2014 2:21 PM

## 2014-10-26 NOTE — Progress Notes (Signed)
SLP Cancellation Note  Patient Details Name: [name] MRN: [id number] DOB: Nov 30, 1912   Cancelled treatment:       Reason Eval/Treat Not Completed: Patient at procedure or test/unavailable Reviewed notes and spoke w/nsg. Pt is currently out of his room for a procedure. Pt is currently on a Dysphagia I diet w/thin liquids but w/complaints of food becoming stuck in his throat. Will f/u tomorrow w/full bedside swallow eval. Nsg in agreement.    Marquand,Maaran 11-08-14, 1:36 PM

## 2014-10-26 NOTE — Progress Notes (Signed)
[facility] [place] [facility]   PATIENT NAME: [name]    MR#:  [id number]  DATE OF BIRTH:  Oct 01, 1912  SUBJECTIVE:  CHIEF COMPLAINT:   Chief Complaint  Patient presents with  . Fever  . Cough  . Diarrhea     never had any chest pain.   Diarrhea is better after avoiding milk products.   Walked with PT. C/o some dry cough.   Have some bloating of stomach.   Feels very weak today- had urinary retention 10/24/14- so re-inserted foley.  renal function worse- so decided to start on HD.   C/o food getting stuck in upper chest.  REVIEW OF SYSTEMS:  CONSTITUTIONAL: No fever,positive for fatigue or weakness.  EYES: No blurred or double vision.  EARS, NOSE, AND THROAT: No tinnitus or ear pain.  RESPIRATORY: positive cough,no shortness of breath, wheezing or hemoptysis.  CARDIOVASCULAR: No chest pain, orthopnea, edema.  GASTROINTESTINAL: No nausea, vomiting, positive for diarrhea , no abdominal pain.  GENITOURINARY: No dysuria, hematuria.  ENDOCRINE: No polyuria, nocturia,  HEMATOLOGY: No anemia, easy bruising or bleeding SKIN: No rash or lesion. MUSCULOSKELETAL: right great toe joint pain or arthritis.   NEUROLOGIC: No tingling, numbness, had generalized weakness.  PSYCHIATRY: No anxiety or depression.   ROS  DRUG ALLERGIES:  No Known Allergies  VITALS:  Blood pressure 106/75, pulse 78, temperature 97.7 F (36.5 C), temperature source Oral, resp. rate 20, height  (1.676 m), weight 84 kg (185 lb 3 oz), SpO2 99 %.  PHYSICAL EXAMINATION:   GENERAL: 81 y.o.-year-old patient lying in the bed with no acute distress.  EYES: Pupils equal, round, reactive to light and accommodation. No scleral icterus. Extraocular muscles intact.  HEENT: Head atraumatic, normocephalic. Oropharynx and nasopharynx clear.  NECK: Supple, no jugular venous distention. No thyroid enlargement, no tenderness.  LUNGS: Coarse rhonchi heard bilaterally over both  lungs, Moving air bilaterally.  mild crackles heard. No use of accessory muscles of respiration.  CARDIOVASCULAR: S1, S2 normal. No rubs, or gallops. 3/6 systolic murmur present ABDOMEN: Soft, nontender, appears distended. Bowel sounds present. No organomegaly or mass.  EXTREMITIES: No cyanosis, or clubbing. No pedal edema noted on the left foot. Right foot had swelling around first toe- is much better now. NEUROLOGIC: Cranial nerves II through XII are intact. Muscle strength 4/5 in all extremities. Sensation intact. Gait not checked. No focal neurological deficits PSYCHIATRIC: The patient is alert and oriented x 3.  SKIN: No obvious rash, lesion, or ulcer.   Physical Exam LABORATORY PANEL:   CBC  Recent Labs Lab 2014-11-08 0638  WBC 10.5  HGB 11.2*  HCT 33.3*  PLT 151   ------------------------------------------------------------------------------------------------------------------  Chemistries   Recent Labs Lab 10/08/2014 1921  08-Nov-2014 0638  NA 135  < > 128*  K 3.4*  < > 4.2  CL 100*  < > 98*  CO2 20*  < > 22  GLUCOSE 159*  < > 157*  BUN 55*  < > 91*  CREATININE 3.02*  < > 3.03*  CALCIUM 8.8*  < > 8.2*  AST 81*  --   --   ALT 14*  --   --   ALKPHOS 84  --   --   BILITOT 1.3*  --   --   < > = values in this interval not displayed. ------------------------------------------------------------------------------------------------------------------  Cardiac Enzymes  Recent Labs Lab 10/22/14 0654 10/22/14 1243  TROPONINI 17.78* 15.62*   ------------------------------------------------------------------------------------------------------------------  RADIOLOGY:  No results found.  ASSESSMENT AND PLAN:   [name] is a 81 y.o. male with a known history of history of coronary reactive disease status post bypass graft surgery and PCI, hypertension, AAA repair, chronic bronchitis not on home oxygen, congestive heart failure with systolic dysfunction, last  known ejection fraction of 30% presents to the hospital secondary to worsening right foot big toe pain with redness, fevers, worsening diarrhea and cough.  #1 non-ST segment elevation MI-patient is asymptomatic,  -Significant known cardiac history -monitor telemetry, cardiology consult appreciated  -IV heparin drip discontinued after 48 hours, recycle troponins stayed in range of 17 -Blood pressure is on the lower side, so not on beta blockers. Continue statin -Echocardiogram done= low EF. Known history of severe aortic stenosis - no aggressive work ups due to old age. - No ACE inhi due to renal failure.  #2 acute gouty attack-on low-dose prednisone at home, change to 60 mg daily at this time. -Colchicine daily- stopped now as given enough dose in renal failure.  #3 sepsis-blood cultures, sputum cultures and urine cultures ordered at this time. -Chest x-ray with no significant infiltrate, but has respiratory symptoms with productive cough.   likely bronchitis- improving now. -Given cover with broad-spectrum antibiotics for now pending sputum cultures- Changed to Doxy. Blood cx negative.  #4 acute renal failure on CKD-likely prerenal and ATN -Renal ultrasound without significant findings. Gentle hydration for a day. - nephrology following daily. - Avoid nephrotoxins. - Baseline creatinine is around 1.8 - inspite of gentle hydration- had worsening renal func and low Urien out put (10/15/2014)     So Nephrology starting hemodialysis.  #5 subacute diarrhea-going on for almost a month. -No recent use of antibiotics. Stool cultures and stool for C. difficile ordered- negative -Gastroenterology consult appreciated-suggest to avoid milk product. - improved. Have some bloating- ordered maalox.  #6 hypertension-hypotensive in the hospital. Not on any blood pressure medications. -Monitor at this time  #7 chronic systolic CHF-not fluid overloaded. -Due to his diarrhea, volume depleted. With  acute renal failure we'll hold his torsemide. -Gentle hydration and follow up -Repeat echocardiogram - cardiology following.  #8 DVT prophylaxis-on heparin subcutaneous.  #9 decreased intake ,weakness and bloating    Added maalox. Called Dietary consult.     Has complain of food getting stuck in chest ( 10/25/2014)    Called SLP eval.  #10 Urinary retention  Was unable to pass urine once foley removed on9/21/16   Reinserted foley. He was already on Proscar   Added flomax.  All the records are reviewed and case discussed with Care Management/Social Workerr. Management plans discussed with the patient, family and they are in agreement.  CODE STATUS: Yes for CPR- No intubation or ventilator.  TOTAL TIME TAKING CARE OF THIS PATIENT: 35 minutes.    POSSIBLE D/C IN  3-4 DAYS, DEPENDING ON CLINICAL CONDITION.  Plan discussed with his daughter present in room.  Altamese Dilling M.D on 10/11/2014   Between 7am to 6pm - Pager - [phone]  After 6pm go to [email or web address] - password [email or web address]  [facility]  Office  [phone]  CC: Primary care physician; Brayton El, MD

## 2014-10-26 NOTE — Progress Notes (Signed)
[facility]  ROUNDING NOTE   Subjective:  Renal function actually worse. BUN up to 91, Cr currently up to 3.03. UOP dropped to 525cc.     Objective:  Vital signs in last 24 hours:  Temp:  [98.2 F (36.8 C)-98.5 F (36.9 C)] 98.2 F (36.8 C) (09/23 2542) Pulse Rate:  [66-72] 72 (09/23 0608) Resp:  [18] 18 (09/23 0608) BP: (88-118)/(68-89) 118/89 mmHg (09/23 0608) SpO2:  [96 %-99 %] 98 % (09/23 1054)  Weight change:  Filed Weights   10/07/2014 1911  Weight: 75.751 kg (167 lb)    Intake/Output: I/O last 3 completed shifts: In: 18 [P.O.:150; I.V.:480] Out: 1225 [Urine:1225]   Intake/Output this shift:     Physical Exam: General: NAD, laying in bed  Head: Normocephalic, atraumatic. Moist oral mucosal membranes  Eyes: Anicteric  Neck: Supple, trachea midline  Lungs:  Bilateral rhonchi, normal effort  Heart: Regular rate and rhythm  Abdomen:  Soft, nontender, BS present  Extremities:  1+ peripheral edema.  Neurologic: Nonfocal, moving all four extremities  Skin: No lesions       Basic Metabolic Panel:  Recent Labs Lab 10/22/14 0654 10/23/14 0443 10/24/14 0428 10/25/14 0420 10/27/2014 0638  NA 136 133* 130* 129* 128*  K 3.3* 3.2* 3.2* 3.2* 4.2  CL 104 102 99* 98* 98*  CO2 22 19* 18* 21* 22  GLUCOSE 133* 169* 163* 172* 157*  BUN 56* 66* 71* 79* 91*  CREATININE 2.91* 2.82* 2.85* 2.88* 3.03*  CALCIUM 8.0* 8.0* 8.0* 8.4* 8.2*    Liver Function Tests:  Recent Labs Lab 10/12/2014 1921  AST 81*  ALT 14*  ALKPHOS 84  BILITOT 1.3*  PROT 7.5  ALBUMIN 3.6   No results for input(s): LIPASE, AMYLASE in the last 168 hours. No results for input(s): AMMONIA in the last 168 hours.  CBC:  Recent Labs Lab 10/17/2014 1921 10/22/14 0654 10/23/14 0443 10/24/14 0428 10/25/14 0420 11/01/2014 0638  WBC 9.5 7.7 9.6 9.5 10.0 10.5  NEUTROABS 7.3*  --   --   --   --   --   HGB 12.4* 10.4* 10.9* 10.6* 11.0* 11.2*  HCT 36.8* 30.7* 33.4* 31.6* 32.8* 33.3*   MCV 94.1 94.4 94.3 93.0 92.9 93.2  PLT 165 130* 154 162 160 151    Cardiac Enzymes:  Recent Labs Lab 10/25/2014 1921 10/22/2014 2348 10/22/14 0654 10/22/14 1243  TROPONINI 17.10* 19.39* 17.78* 15.62*    BNP: Invalid input(s): POCBNP  CBG:  Recent Labs Lab 10/24/14 0730 10/24/14 1158 10/24/14 1624  GLUCAP 171* 181* 167*    Microbiology: Results for orders placed or performed during the hospital encounter of 10/05/2014  Blood Culture (routine x 2)     Status: None (Preliminary result)   Collection Time: 10/14/2014  7:21 PM  Result Value Ref Range Status   Specimen Description BLOOD LEFT ASSIST CONTROL  Final   Special Requests BOTTLES DRAWN AEROBIC AND ANAEROBIC 5CC  Final   Culture NO GROWTH 4 DAYS  Final   Report Status PENDING  Incomplete  Blood Culture (routine x 2)     Status: None (Preliminary result)   Collection Time: 10/07/2014  7:21 PM  Result Value Ref Range Status   Specimen Description BLOOD LEFT ARM  Final   Special Requests BOTTLES DRAWN AEROBIC AND ANAEROBIC 4CC  Final   Culture NO GROWTH 4 DAYS  Final   Report Status PENDING  Incomplete  Stool culture     Status: None   Collection Time:  10/19/2014 11:36 PM  Result Value Ref Range Status   Specimen Description STOOL  Final   Special Requests Normal  Final   Culture   Final    NO SALMONELLA OR SHIGELLA ISOLATED NO CAMPYLOBACTER DETECTED No Pathogenic E. coli detected    Report Status 10/24/2014 FINAL  Final  C difficile quick scan w PCR reflex     Status: None   Collection Time: 10/11/2014 11:36 PM  Result Value Ref Range Status   C Diff antigen NEGATIVE NEGATIVE Final   C Diff toxin NEGATIVE NEGATIVE Final   C Diff interpretation Negative for C. difficile  Final  Urine culture     Status: None   Collection Time: 10/22/14  5:00 AM  Result Value Ref Range Status   Specimen Description URINE, RANDOM  Final   Special Requests NONE  Final   Culture NO GROWTH 1 DAY  Final   Report Status 10/23/2014 FINAL   Final  Culture, expectorated sputum-assessment     Status: None (Preliminary result)   Collection Time: 10/22/14  5:00 AM  Result Value Ref Range Status   Specimen Description SPUTUM  Final   Special Requests Normal  Final   Sputum evaluation   Final    Sputum specimen not acceptable for testing.  Please recollect.   CALLED LISA Monserrat Vidaurri AT [phone] 10/22/14.PMH   Report Status PENDING  Incomplete  Culture, expectorated sputum-assessment     Status: None   Collection Time: 10/22/14  6:55 PM  Result Value Ref Range Status   Specimen Description SPUTUM  Final   Special Requests Normal  Final   Sputum evaluation THIS SPECIMEN IS ACCEPTABLE FOR SPUTUM CULTURE  Final   Report Status 10/22/2014 FINAL  Final  Culture, respiratory (NON-Expectorated)     Status: None (Preliminary result)   Collection Time: 10/22/14  6:55 PM  Result Value Ref Range Status   Specimen Description SPUTUM  Final   Special Requests Normal Reflexed from [id number]  Final   Gram Stain   Final    GOOD SPECIMEN - 80-90% WBCS FEW WBC SEEN MANY GRAM POSITIVE COCCI IN CLUSTERS FEW GRAM POSITIVE RODS    Culture   Final    LIGHT GROWTH GRAM NEGATIVE RODS MOLD SENT TO STATE FOR IDENTIFICATION IDENTIFICATION AND SUSCEPTIBILITIES TO FOLLOW    Report Status PENDING  Incomplete    Coagulation Studies: No results for input(s): LABPROT, INR in the last 72 hours.  Urinalysis: No results for input(s): COLORURINE, LABSPEC, PHURINE, GLUCOSEU, HGBUR, BILIRUBINUR, KETONESUR, PROTEINUR, UROBILINOGEN, NITRITE, LEUKOCYTESUR in the last 72 hours.  Invalid input(s): APPERANCEUR    Imaging: No results found.   Medications:   . sodium chloride Stopped (10/13/2014 1020)   . amiodarone  200 mg Oral Weekly  . aspirin EC  81 mg Oral Daily  . clopidogrel  75 mg Oral Daily  . doxycycline  100 mg Oral Q12H  . feeding supplement (ENSURE ENLIVE)  237 mL Oral BID BM  . finasteride  5 mg Oral Daily  . heparin subcutaneous  5,000 Units  Subcutaneous 3 times per day  . mometasone-formoterol  2 puff Inhalation BID  . multivitamin with minerals  1 tablet Oral Daily  . potassium chloride  20 mEq Oral BID  . pravastatin  40 mg Oral Daily  . predniSONE  60 mg Oral Q breakfast  . sodium chloride  3 mL Intravenous Q12H  . tamsulosin  0.4 mg Oral Daily  . timolol  1 drop Both Eyes Daily  acetaminophen **OR** acetaminophen, albuterol, ALPRAZolam, alum & mag hydroxide-simeth, guaiFENesin, HYDROcodone-homatropine, phenol-menthol  Assessment/ Plan:  81 y.o. male with a PMHx of abdominal aortic aneurysm status post open repair 2005, status post CABG with PCI in 5277, chronic systolic heart failure ejection fraction 20-25%, hypertension, hyperlipidemia, right carotid artery disease, gout, chronic kidney disease stage III Baseline creatinine 1.5 with an EGFR 36 who presented to [facility] with fevers, chills, cough.   1. Acute renal failure/chronic kidney disease stage III baseline Cr 1.5/egfr 36: Renal ultrasound was unremarkable for hydronephrosis. Urinalysis unremarkable. Renal failure likely multifactorial with contributions from nausea, vomiting, diarrhea, underlying heart failure, and pneumonia. -BUN and Cr worse at present, UOP only 525cc over the past 24 hours.  Will plan to place temporary HD catheter today and plan for a short session of HD.  Will stop IVFs.   2. Anemia of CKD: hgb up to 11, on indication for epogen.  3. Mildly complicated right renal cyst/Acquired renal cyst.Could consider following as outpt, but not a significant acute issue at the moment.  4.  Hyponatremia: Na hasn't significantly changed, most likely has SIADH.  Should partially correct with HD.    LOS: 5 LATEEF, MUNSOOR 9/23/201611:23 AM

## 2014-10-26 NOTE — Care Management (Signed)
Renal status is worsening.  Temporary dialysis catheter is to be placed today and hemodialysis will be started this day.  Spoke with attending about code status and he will modified the code status.  Patient  Will be a partial/limited code.

## 2014-10-26 NOTE — Progress Notes (Signed)
Physical Therapy Treatment Patient Details Name: [name] MRN: [id number] DOB: 04/23/1912 Today's Date: 10/22/2014    History of Present Illness Pt admitted to hospital after c/o fever, chills, diarrhea, and a cough. Testing revealed that pt had an NSTEMI. Troponin levels also found to be elevated (peak at 19.3). Plan is to not pursue any aggressive cardiac intervention at this point due to advanced age.     PT Comments    Pt states that he is feeling better today, he is not as exhausted as yesterday. States that he got some good sleep last night for the first time in a while and his appetite is decent. Pt did require more assistance with all mobility today as compared to his initial evaluation; pt may be experiencing some residual weakness from yesterday. Pt required min assist for bed mobility (supine <> sitting on EOB, and scooting up toward [facility]). Pt required min assist for sitting <>standing with RW. Pt ambulated x75 feet with min assist and RW. Pt's SpO2% was monitored continuously throughout session; SpO25 never fell bellow 89% on room air. At end of session pt and his daughter were educated on level of assistance pt is going to require if d/c to home. Pt requires 24 hour assistance and CGA- min assist during all mobility tasks/transfers and needs to use RW for all mobility in order to ensure safety and reduce risk of falls. Pt and daughter appreciated and acknowledged understanding of these recommendations.       Follow Up Recommendations  Home health PT;Supervision - Intermittent     Equipment Recommendations  Rolling walker with 5" wheels    Recommendations for Other Services       Precautions / Restrictions Precautions Precautions: Fall Restrictions Weight Bearing Restrictions: No    Mobility  Bed Mobility Overal bed mobility: Needs Assistance Bed Mobility: Sidelying to Sit;Supine to Sit;Sit to Supine     Supine to sit: Min assist (min assist with patient  holding on to PT arms for pulling up in to sittingl) Sit to supine: Min assist (min assist +2 for scooting pt up toward [facility] after lying supine)   General bed mobility comments: Pt did worse with bed mobility today as compared to his initial evaluation. Pt still seems to have some residual weakness from yesterday.   Transfers Overall transfer level: Needs assistance Equipment used: Rolling walker (2 wheeled) Transfers: Sit to/from Stand Sit to Stand: Min assist         General transfer comment: Pt still having some residual weakness from yesterday. Pt required min assit +1 for sit to stand transfer. No impulsive decision displayed today.   Ambulation/Gait Ambulation/Gait assistance: Min assist Ambulation Distance (Feet): 75 Feet Assistive device: Rolling walker (2 wheeled) Gait Pattern/deviations: Step-through pattern;Decreased step length - right;Decreased step length - left;Decreased stride length   Gait velocity interpretation: <1.8 ft/sec, indicative of risk for recurrent falls General Gait Details: Pt did a better job today staying close to his RW.    Stairs            Wheelchair Mobility    Modified Rankin (Stroke Patients Only)       Balance Overall balance assessment: Needs assistance Sitting-balance support: No upper extremity supported;Feet supported Sitting balance-Leahy Scale: Good     Standing balance support: Bilateral upper extremity supported Standing balance-Leahy Scale: Fair                      Cognition Arousal/Alertness: Awake/alert Behavior During Therapy:  WFL for tasks assessed/performed Overall Cognitive Status: Within Functional Limits for tasks assessed                      Exercises Total Joint Exercises Ankle Circles/Pumps: AROM;Both;20 reps;Seated Marching in Standing: AROM;Strengthening;20 reps;Seated Other Exercises Other Exercises: sit<>stand x 5 w/ min assist and RW. Pt instructions included pushing up  through bed as opposed to the RW when coming from sitting to standing.     General Comments        Pertinent Vitals/Pain Pain Assessment: No/denies pain    Home Living                      Prior Function            PT Goals (current goals can now be found in the care plan section) Acute Rehab PT Goals Patient Stated Goal: to go home  PT Goal Formulation: With patient Time For Goal Achievement: 11/07/14 Potential to Achieve Goals: Good Progress towards PT goals: Progressing toward goals    Frequency  Min 2X/week    PT Plan Current plan remains appropriate    Co-evaluation             End of Session Equipment Utilized During Treatment: Gait belt Activity Tolerance: Patient tolerated treatment well Patient left: in bed;with bed alarm set;with call bell/phone within reach;with family/visitor present     Time: 1610-9604 PT Time Calculation (min) (ACUTE ONLY): 24 min  Charges:                       G CodesGeorgina Peer 10/11/2014, 11:16 AM

## 2014-10-26 NOTE — Progress Notes (Signed)
Initial Nutrition Assessment    INTERVENTION:   Meals and Snacks: Cater to patient preferences, sending lactose free/no dairy on meal trays Medical Food Supplement Therapy: pt currently receiving EnsureEnlive on meal trays; this supplement is Lactose Free, however it is not dairy free (does contain milk ingredients). If pt tolerating (ie does not worsen diarrhea), recommend continuing. Limited options for milk ingredient free supplements in hospital but several options available as outpatient.   NUTRITION DIAGNOSIS:   Reassess on follow-up  GOAL:   Patient will meet greater than or equal to 90% of their needs  MONITOR:    (Energy Intake, Anthropometrics, Electrolyte.Renal Profile, Glucose Profile, Digestive System)  REASON FOR ASSESSMENT:   Consult  (lactose free )  ASSESSMENT:    Pt admitted with NSTEMI, fever, diarrhea; ARF, down for temp HD cath today with plans for short session of HD  Past Medical History  Diagnosis Date  . AAA (abdominal aortic aneurysm) 2005    Open AAA repair  . S/P CABG (coronary artery bypass graft) 2003    At [facility]; PCI 2004; no LHC since 2004  . Echocardiogram abnormal 11/10    Mild LVH, EF 55%, mild AI, diastolic dysfunction  . Hypertension     Unspecified  . Hyperlipidemia     Mixed  . History of orthostatic hypotension   . Carotid artery disease     Right basal ganglia CVA 11/10; CTA neck showed 90% RICA stenosis, 75-80% LICA stenosis  . Coronary artery disease     a. s/p CABG 1995 b. PCI in 2004  . Hx of bronchitis   . Pneumonia     HISTORY  . Gout   . History of AAA (abdominal aortic aneurysm) repair 07/09/2014    2008   . CKD (chronic kidney disease)     baseline creatinine of 1.8  . Gout      Diet Order:  DIET - DYS 1 Room service appropriate?: Yes; Fluid consistency:: Thin; no dairy or lactose; SLP consulted  Energy Intake: recorded po intake 58% on average, pt receiving Ensure on meal trays  Digestive System: diarrhea  for 1 month prior to admission; diarrhea improved after avoidance of lactose/dairy products per MD note  Electrolyte and Renal Profile:  Recent Labs Lab 10/24/14 0428 10/25/14 0420 10-28-2014 0638  BUN 71* 79* 91*  CREATININE 2.85* 2.88* 3.03*  NA 130* 129* 128*  K 3.2* 3.2* 4.2   Glucose Profile:   Recent Labs  10/24/14 0730 10/24/14 1158 10/24/14 1624  GLUCAP 171* 181* 167*   Meds: MVI, prednisone   Nutrition Focused Physical Exam:  Unable to complete Nutrition-Focused physical exam at this time. Pt down for procedure, not in room  Height:   Ht Readings from Last 1 Encounters:  10/22/2014  (1.676 m)    Weight: weight appears relatively stable (slight fluctuations up and down) since the beginning of the  year  Wt Readings from Last 1 Encounters:  10/20/2014 167 lb (75.751 kg)    Wt Readings from Last 10 Encounters:  10/14/2014 167 lb (75.751 kg)  10/11/14 174 lb 4.8 oz (79.062 kg)  08/09/14 162 lb 3.2 oz (73.573 kg)  08/02/14 164 lb 4 oz (74.503 kg)  07/30/14 164 lb 14.4 oz (74.798 kg)  07/09/14 169 lb (76.658 kg)  04/17/14 168 lb (76.204 kg)  06/15/14 170 lb (77.111 kg)  06/06/14 170 lb 6.4 oz (77.293 kg)  02/07/14 165 lb 4 oz (74.957 kg)    BMI:  Body mass index  is 26.97 kg/(m^2).  Estimated Nutritional Needs:   Kcal:  1914-7829 kcals (BEE 1310, 1.3 AF, 1.1-1.3 IF)  Protein:  91-114 g (1.2-1.5 gkg)   Fluid:  1000 mL plus UOP  HIGH Care Level  Romelle Starcher MS, RD, LDN [phone] Pager

## 2014-10-27 LAB — RENAL FUNCTION PANEL
ALBUMIN: 3.2 g/dL — AB (ref 3.5–5.0)
Anion gap: 11 (ref 5–15)
BUN: 89 mg/dL — AB (ref 6–20)
CALCIUM: 8.7 mg/dL — AB (ref 8.9–10.3)
CO2: 21 mmol/L — AB (ref 22–32)
CREATININE: 2.79 mg/dL — AB (ref 0.61–1.24)
Chloride: 98 mmol/L — ABNORMAL LOW (ref 101–111)
GFR calc Af Amer: 20 mL/min — ABNORMAL LOW (ref >60)
GFR calc non Af Amer: 17 mL/min — ABNORMAL LOW (ref >60)
GLUCOSE: 156 mg/dL — AB (ref 65–99)
PHOSPHORUS: 3.6 mg/dL (ref 2.5–4.6)
Potassium: 4.8 mmol/L (ref 3.5–5.1)
SODIUM: 130 mmol/L — AB (ref 135–145)

## 2014-10-27 LAB — CBC
HCT: 34.4 % — ABNORMAL LOW (ref 40.0–52.0)
Hemoglobin: 11.3 g/dL — ABNORMAL LOW (ref 13.0–18.0)
MCH: 30.7 pg (ref 26.0–34.0)
MCHC: 32.7 g/dL (ref 32.0–36.0)
MCV: 93.9 fL (ref 80.0–100.0)
PLATELETS: 157 10*3/uL (ref 150–440)
RBC: 3.66 MIL/uL — ABNORMAL LOW (ref 4.40–5.90)
RDW: 15.2 % — ABNORMAL HIGH (ref 11.5–14.5)
WBC: 16.2 10*3/uL — ABNORMAL HIGH (ref 3.8–10.6)

## 2014-10-27 LAB — HEPATITIS B CORE ANTIBODY, TOTAL: HEP B C TOTAL AB: POSITIVE — AB

## 2014-10-27 LAB — HEPATITIS B SURFACE ANTIBODY,QUALITATIVE: Hep B S Ab: NONREACTIVE

## 2014-10-27 LAB — HEPATITIS B SURFACE ANTIGEN: Hepatitis B Surface Ag: NEGATIVE

## 2014-10-27 NOTE — Progress Notes (Signed)
Skin checked w Eden Lathe RN on admission

## 2014-10-27 NOTE — Progress Notes (Signed)
[facility]  ROUNDING NOTE   Subjective:  Pt seen during second HD treatment.  Tolerating well thus far. No UF planned.  Remains short of breath and with a cough.   Objective:  Vital signs in last 24 hours:  Temp:  [97.3 F (36.3 C)-97.7 F (36.5 C)] 97.3 F (36.3 C) (09/24 0935) Pulse Rate:  [46-80] 76 (09/24 1100) Resp:  [12-25] 15 (09/24 1100) BP: (95-121)/(72-89) 103/87 mmHg (09/24 1100) SpO2:  [91 %-100 %] 99 % (09/24 0935) Weight:  [82.4 kg (181 lb 10.5 oz)-85 kg (187 lb 6.3 oz)] 85 kg (187 lb 6.3 oz) (09/24 1005)  Weight change:  Filed Weights   10/23/2014 1555 10/23/2014 1817 10/27/14 1005  Weight: 82.4 kg (181 lb 10.5 oz) 84 kg (185 lb 3 oz) 85 kg (187 lb 6.3 oz)    Intake/Output: I/O last 3 completed shifts: In: 740 [P.O.:300; I.V.:440] Out: 350 [Urine:350]   Intake/Output this shift:  Total I/O In: -  Out: 350 [Urine:350]  Physical Exam: General: NAD, laying in bed  Head: Normocephalic, atraumatic. Moist oral mucosal membranes  Eyes: Anicteric  Neck: Supple, trachea midline  Lungs:  Bilateral rhonchi, normal effort  Heart: Regular rate and rhythm  Abdomen:  Soft, nontender, BS present  Extremities:  1+ peripheral edema.  Neurologic: Nonfocal, moving all four extremities  Skin: No lesions       Basic Metabolic Panel:  Recent Labs Lab 10/23/14 0443 10/24/14 0428 10/25/14 0420 10/13/2014 0638 10/12/2014 1600 10/27/14 1010  NA 133* 130* 129* 128*  --  130*  K 3.2* 3.2* 3.2* 4.2  --  4.8  CL 102 99* 98* 98*  --  98*  CO2 19* 18* 21* 22  --  21*  GLUCOSE 169* 163* 172* 157*  --  156*  BUN 66* 71* 79* 91*  --  89*  CREATININE 2.82* 2.85* 2.88* 3.03*  --  2.79*  CALCIUM 8.0* 8.0* 8.4* 8.2*  --  8.7*  PHOS  --   --   --   --  4.0 3.6    Liver Function Tests:  Recent Labs Lab 10/30/2014 1921 10/27/14 1010  AST 81*  --   ALT 14*  --   ALKPHOS 84  --   BILITOT 1.3*  --   PROT 7.5  --   ALBUMIN 3.6 3.2*   No results for input(s):  LIPASE, AMYLASE in the last 168 hours. No results for input(s): AMMONIA in the last 168 hours.  CBC:  Recent Labs Lab 10/23/2014 1921  10/23/14 0443 10/24/14 0428 10/25/14 0420 10/08/2014 0638 10/27/14 1010  WBC 9.5  < > 9.6 9.5 10.0 10.5 16.2*  NEUTROABS 7.3*  --   --   --   --   --   --   HGB 12.4*  < > 10.9* 10.6* 11.0* 11.2* 11.3*  HCT 36.8*  < > 33.4* 31.6* 32.8* 33.3* 34.4*  MCV 94.1  < > 94.3 93.0 92.9 93.2 93.9  PLT 165  < > 154 162 160 151 157  < > = values in this interval not displayed.  Cardiac Enzymes:  Recent Labs Lab 11/01/2014 1921 10/20/2014 2348 10/22/14 0654 10/22/14 1243  TROPONINI 17.10* 19.39* 17.78* 15.62*    BNP: Invalid input(s): POCBNP  CBG:  Recent Labs Lab 10/24/14 0730 10/24/14 1158 10/24/14 1624  GLUCAP 171* 181* 167*    Microbiology: Results for orders placed or performed during the hospital encounter of 10/17/2014  Blood Culture (routine x 2)       Status: None (Preliminary result)   Collection Time: 10/12/2014  7:21 PM  Result Value Ref Range Status   Specimen Description BLOOD LEFT ASSIST CONTROL  Final   Special Requests BOTTLES DRAWN AEROBIC AND ANAEROBIC 5CC  Final   Culture NO GROWTH 4 DAYS  Final   Report Status PENDING  Incomplete  Blood Culture (routine x 2)     Status: None (Preliminary result)   Collection Time: 10/10/2014  7:21 PM  Result Value Ref Range Status   Specimen Description BLOOD LEFT ARM  Final   Special Requests BOTTLES DRAWN AEROBIC AND ANAEROBIC 4CC  Final   Culture NO GROWTH 4 DAYS  Final   Report Status PENDING  Incomplete  Stool culture     Status: None   Collection Time: 10/16/2014 11:36 PM  Result Value Ref Range Status   Specimen Description STOOL  Final   Special Requests Normal  Final   Culture   Final    NO SALMONELLA OR SHIGELLA ISOLATED NO CAMPYLOBACTER DETECTED No Pathogenic E. coli detected    Report Status 10/24/2014 FINAL  Final  C difficile quick scan w PCR reflex     Status: None    Collection Time: 10/16/2014 11:36 PM  Result Value Ref Range Status   C Diff antigen NEGATIVE NEGATIVE Final   C Diff toxin NEGATIVE NEGATIVE Final   C Diff interpretation Negative for C. difficile  Final  Urine culture     Status: None   Collection Time: 10/22/14  5:00 AM  Result Value Ref Range Status   Specimen Description URINE, RANDOM  Final   Special Requests NONE  Final   Culture NO GROWTH 1 DAY  Final   Report Status 10/23/2014 FINAL  Final  Culture, expectorated sputum-assessment     Status: None (Preliminary result)   Collection Time: 10/22/14  5:00 AM  Result Value Ref Range Status   Specimen Description SPUTUM  Final   Special Requests Normal  Final   Sputum evaluation   Final    Sputum specimen not acceptable for testing.  Please recollect.   CALLED LISA SCOTT AT [phone] 10/22/14.PMH   Report Status PENDING  Incomplete  Culture, expectorated sputum-assessment     Status: None   Collection Time: 10/22/14  6:55 PM  Result Value Ref Range Status   Specimen Description SPUTUM  Final   Special Requests Normal  Final   Sputum evaluation THIS SPECIMEN IS ACCEPTABLE FOR SPUTUM CULTURE  Final   Report Status 10/22/2014 FINAL  Final  Culture, respiratory (NON-Expectorated)     Status: None (Preliminary result)   Collection Time: 10/22/14  6:55 PM  Result Value Ref Range Status   Specimen Description SPUTUM  Final   Special Requests Normal Reflexed from [id number]  Final   Gram Stain   Final    GOOD SPECIMEN - 80-90% WBCS FEW WBC SEEN MANY GRAM POSITIVE COCCI IN CLUSTERS FEW GRAM POSITIVE RODS    Culture   Final    LIGHT GROWTH ACHROMOBACTER SPECIES MOLD SENT TO STATE FOR IDENTIFICATION    Report Status PENDING  Incomplete   Organism ID, Bacteria ACHROMOBACTER SPECIES  Final      Susceptibility   Achromobacter species - MIC*    AMPICILLIN Value in next row Resistant      RESISTANT>=32    CEFTAZIDIME Value in next row Sensitive      SENSITIVE4    CEFAZOLIN Value in next row  Resistant      RESISTANT>=64  CEFTRIAXONE Value in next row Resistant      RESISTANT>=64    CIPROFLOXACIN Value in next row Resistant      RESISTANT>=4    GENTAMICIN Value in next row Resistant      RESISTANT>=16    IMIPENEM Value in next row Sensitive      SENSITIVE1    TRIMETH/SULFA Value in next row Sensitive      SENSITIVE<=20    PIP/TAZO Value in next row Sensitive      SENSITIVE<=4    * LIGHT GROWTH ACHROMOBACTER SPECIES    Coagulation Studies: No results for input(s): LABPROT, INR in the last 72 hours.  Urinalysis: No results for input(s): COLORURINE, LABSPEC, PHURINE, GLUCOSEU, HGBUR, BILIRUBINUR, KETONESUR, PROTEINUR, UROBILINOGEN, NITRITE, LEUKOCYTESUR in the last 72 hours.  Invalid input(s): APPERANCEUR    Imaging: No results found.   Medications:     . amiodarone  200 mg Oral Weekly  . aspirin EC  81 mg Oral Daily  . atorvastatin  40 mg Oral q1800  . clopidogrel  75 mg Oral Daily  . doxycycline  100 mg Oral Q12H  . feeding supplement  1 Container Oral TID BM  . feeding supplement (ENSURE ENLIVE)  237 mL Oral BID BM  . finasteride  5 mg Oral Daily  . heparin subcutaneous  5,000 Units Subcutaneous 3 times per day  . mometasone-formoterol  2 puff Inhalation BID  . multivitamin with minerals  1 tablet Oral Daily  . potassium chloride  20 mEq Oral BID  . predniSONE  60 mg Oral Q breakfast  . sodium chloride  3 mL Intravenous Q12H  . tamsulosin  0.4 mg Oral Daily  . timolol  1 drop Both Eyes Daily   sodium chloride, sodium chloride, acetaminophen **OR** acetaminophen, albuterol, ALPRAZolam, alteplase, alum & mag hydroxide-simeth, guaiFENesin, heparin, HYDROcodone-homatropine, lidocaine (PF), lidocaine-prilocaine, pentafluoroprop-tetrafluoroeth, phenol-menthol  Assessment/ Plan:  81 y.o. male with a PMHx of abdominal aortic aneurysm status post open repair 2005, status post CABG with PCI in 9432, chronic systolic heart failure ejection fraction 20-25%,  hypertension, hyperlipidemia, right carotid artery disease, gout, chronic kidney disease stage III Baseline creatinine 1.5 with an EGFR 36 who presented to [facility] with fevers, chills, cough.   1. Acute renal failure/chronic kidney disease stage III baseline Cr 1.5/egfr 36: Renal ultrasound was unremarkable for hydronephrosis. Urinalysis unremarkable. Renal failure likely multifactorial with contributions from nausea, vomiting, diarrhea, underlying heart failure, and pneumonia. -Pt seen during second HD treatment, tolerating well, will plan for another HD treatment tomorrow.   2. Anemia of CKD: hgb 11.3 and acceptable, no indication for procrit.  3. Mildly complicated right renal cyst/Acquired renal cyst.Could consider following as outpt, but not a significant acute issue at the moment.  4.  Hyponatremia: Na up to 130 with HD, will recheck tomorrow again.     LOS: 6 LATEEF, MUNSOOR 9/24/201611:37 AM

## 2014-10-27 NOTE — Evaluation (Signed)
Clinical/Bedside Swallow Evaluation Patient Details  Name: [name] MRN: [id number] Date of Birth: [birth year]  Today's Date: 10/27/2014 Time: SLP Start Time (ACUTE ONLY): 0919 SLP Stop Time (ACUTE ONLY): 0945 SLP Time Calculation (min) (ACUTE ONLY): 26 min  Past Medical History:  Past Medical History  Diagnosis Date  . AAA (abdominal aortic aneurysm) 2005    Open AAA repair  . S/P CABG (coronary artery bypass graft) 2003    At [facility]; PCI 2004; no LHC since 2004  . Echocardiogram abnormal 11/10    Mild LVH, EF 55%, mild AI, diastolic dysfunction  . Hypertension     Unspecified  . Hyperlipidemia     Mixed  . History of orthostatic hypotension   . Carotid artery disease     Right basal ganglia CVA 11/10; CTA neck showed 90% RICA stenosis, 75-80% LICA stenosis  . Coronary artery disease     a. s/p CABG 1995 b. PCI in 2004  . Hx of bronchitis   . Pneumonia     HISTORY  . Gout   . History of AAA (abdominal aortic aneurysm) repair 07/09/2014    2008   . CKD (chronic kidney disease)     baseline creatinine of 1.8  . Gout    Past Surgical History:  Past Surgical History  Procedure Laterality Date  . Coronary artery bypass graft  2003  . Abdominal aortic aneurysm repair  2005   HPI:  Pt is a 81y/o male with a hx of MI. pt with no hx dysphagia noted. pt reports having difficulty swallowing and having sensation of food and liquid getting "stuck" at base of throat.    Assessment / Plan / Recommendation Clinical Impression  pt presents with a moderate pharyngeal dysphagia charactorized by pt wet vocal quality post intake of thin liquids via cup and straw. pt states a sensation of food and liquid becoming stuck post swallow and pointed to an area suspected to be portion of esophageal phase. ST notified MD per pt statements and recommended a GI consult. MD stated he will follow up with pt duing rounds. pt recommended to downgrade to NTL at this time as ssx aspiration improve and pt  states it si easier to drink.     Aspiration Risk  Mild    Diet Recommendation Dysphagia 1 (Puree);Nectar   Medication Administration: Whole meds with liquid Compensations: Follow solids with liquid    Other  Recommendations Recommended Consults: Consider GI evaluation Other Recommendations: Remove water pitcher   Follow Up Recommendations       Frequency and Duration min 3x week  1 week   Pertinent Vitals/Pain No pain reported    SLP Swallow Goals     Swallow Study Prior Functional Status       General Date of Onset: 11/01/2014 Other Pertinent Information: Pt is a 81y/o male with a hx of MI. pt with no hx dysphagia noted. pt reports having difficulty swallowing and having sensation of food and liquid getting "stuck" at base of throat.  Type of Study: Bedside swallow evaluation Diet Prior to this Study: Dysphagia 1 (puree);Thin liquids Temperature Spikes Noted: N/A Respiratory Status: Room air History of Recent Intubation: No Behavior/Cognition: Alert;Cooperative;Pleasant mood Oral Cavity - Dentition: Adequate natural dentition/normal for age Self-Feeding Abilities: Able to feed self Patient Positioning: Upright in bed Baseline Vocal Quality: Normal Volitional Cough: Strong Volitional Swallow: Able to elicit    Oral/Motor/Sensory Function Overall Oral Motor/Sensory Function: Appears within functional limits for tasks assessed   Ice  Chips Ice chips: Within functional limits   Thin Liquid Thin Liquid: Impaired Pharyngeal  Phase Impairments: Suspected delayed Swallow;Cough - Immediate;Wet Vocal Quality;Throat Clearing - Immediate    Nectar Thick Nectar Thick Liquid: Within functional limits Presentation: Cup   Honey Thick Honey Thick Liquid: Within functional limits Presentation: Cup   Puree Puree: Within functional limits Presentation: Self Fed   Solid   GO    Solid: Not tested       Joycelyn Rua, MA/CCC-SLP 10/27/2014,10:26 AM

## 2014-10-27 NOTE — Progress Notes (Signed)
[facility] -  at [facility]   PATIENT NAME: [name]    MR#:  [id number]  DATE OF BIRTH:  August 09, 1912  SUBJECTIVE:  CHIEF COMPLAINT:   Chief Complaint  Patient presents with  . Fever  . Cough  . Diarrhea      Diarrhea is better after avoiding milk products. C/o some dry cough. Had urinary retention 10/24/14- so re-inserted foley. HD today   C/o food getting stuck in upper chest. Chronic but now worse.  REVIEW OF SYSTEMS:  CONSTITUTIONAL: No fever,positive for fatigue or weakness.  EYES: No blurred or double vision.  EARS, NOSE, AND THROAT: No tinnitus or ear pain.  RESPIRATORY: positive cough,no shortness of breath, wheezing or hemoptysis.  CARDIOVASCULAR: No chest pain, orthopnea, edema.  GASTROINTESTINAL: No nausea, vomiting, positive for diarrhea , no abdominal pain.  GENITOURINARY: No dysuria, hematuria.  ENDOCRINE: No polyuria, nocturia,  HEMATOLOGY: No anemia, easy bruising or bleeding SKIN: No rash or lesion. MUSCULOSKELETAL: right great toe joint pain or arthritis.   NEUROLOGIC: No tingling, numbness, had generalized weakness.  PSYCHIATRY: No anxiety or depression.   ROS  DRUG ALLERGIES:  No Known Allergies  VITALS:  Blood pressure 108/83, pulse 86, temperature 97.9 F (36.6 C), temperature source Oral, resp. rate 16, height  (1.676 m), weight 85 kg (187 lb 6.3 oz), SpO2 99 %.  PHYSICAL EXAMINATION:   GENERAL: 81 y.o.-year-old patient lying in the bed with no acute distress.  EYES: Pupils equal, round, reactive to light and accommodation. No scleral icterus. Extraocular muscles intact.  HEENT: Head atraumatic, normocephalic. Oropharynx and nasopharynx clear.  NECK: Supple, no jugular venous distention. No thyroid enlargement, no tenderness.  LUNGS: Coarse rhonchi heard bilaterally over both lungs, Moving air bilaterally.  mild crackles heard. No use of accessory muscles of respiration.  CARDIOVASCULAR: S1, S2  normal. No rubs, or gallops. 3/6 systolic murmur present ABDOMEN: Soft, nontender, appears distended. Bowel sounds present. No organomegaly or mass.  EXTREMITIES: No cyanosis, or clubbing. No pedal edema noted on the left foot. Right foot had swelling around first toe- is much better now. NEUROLOGIC: Cranial nerves II through XII are intact. Muscle strength 4/5 in all extremities. Sensation intact. Gait not checked. No focal neurological deficits PSYCHIATRIC: The patient is alert and oriented x 3.  SKIN: No obvious rash, lesion, or ulcer.   Physical Exam LABORATORY PANEL:   CBC  Recent Labs Lab 10/27/14 1010  WBC 16.2*  HGB 11.3*  HCT 34.4*  PLT 157   ------------------------------------------------------------------------------------------------------------------  Chemistries   Recent Labs Lab 2014/10/27 1921  10/27/14 1010  NA 135  < > 130*  K 3.4*  < > 4.8  CL 100*  < > 98*  CO2 20*  < > 21*  GLUCOSE 159*  < > 156*  BUN 55*  < > 89*  CREATININE 3.02*  < > 2.79*  CALCIUM 8.8*  < > 8.7*  AST 81*  --   --   ALT 14*  --   --   ALKPHOS 84  --   --   BILITOT 1.3*  --   --   < > = values in this interval not displayed. ------------------------------------------------------------------------------------------------------------------  Cardiac Enzymes  Recent Labs Lab 10/22/14 0654 10/22/14 1243  TROPONINI 17.78* 15.62*   ------------------------------------------------------------------------------------------------------------------  RADIOLOGY:  No results found.  ASSESSMENT AND PLAN:   [name] is a 81 y.o. male with a known history of history of coronary reactive disease status post bypass graft surgery  and PCI, hypertension, AAA repair, chronic bronchitis not on home oxygen, congestive heart failure with systolic dysfunction, last known ejection fraction of 30% presents to the hospital secondary to worsening right foot big toe pain with redness, fevers,  worsening diarrhea and cough.  # Non-ST segment elevation MI-patient is asymptomatic,  -Significant known cardiac history -monitor telemetry, cardiology consult appreciated  -IV heparin drip discontinued after 48 hours, recycle troponins stayed in range of 17 -Blood pressure is on the lower side, so not on beta blockers. Continue statin -Echocardiogram done= low EF. Known history of severe aortic stenosis - no aggressive work ups due to old age. - No ACE inhibitors due to renal failure.  # Acute gouty attack-on low-dose prednisone at home, change to 60 mg daily at this time.  # sepsis-blood cultures, sputum cultures and urine cultures ordered at this time. -Chest x-ray with no significant infiltrate, but has respiratory symptoms with productive cough.   likely bronchitis- improving now. -Given cover with broad-spectrum antibiotics for now pending sputum cultures- Changed to Doxy. Blood cx negative.  # Acute renal failure on CKD-likely prerenal and ATN -Renal ultrasound without significant findings. -Now on HD  # subacute diarrhea-going on for almost a month. -No recent use of antibiotics. Stool cultures and stool for C. difficile ordered- negative -Gastroenterology consult appreciated-suggest to avoid milk product. - improved.  # Hypertension-hypotensive in the hospital. Not on any blood pressure medications. -Monitor at this time  # chronic systolic CHF-not fluid overloaded. On HD  # Dysphagia Speech eval Will consult GI as he is complaining of odynophagia now  # Urinary retention  Was unable to pass urine once foley removed on 10/24/14   Reinserted foley. He was already on Proscar   Added flomax.  All the records are reviewed and case discussed with Care Management/Social Workerr. Management plans discussed with the patient, family and they are in agreement.  CODE STATUS: Yes for CPR- No intubation or ventilator.  TOTAL TIME TAKING CARE OF THIS PATIENT: 35 minutes.     Milagros Loll R M.D on 10/27/2014   Between 7am to 6pm - Pager - [phone]  After 6pm go to [email or web address] - password [email or web address] [facility]  [facility]  Office  [phone]  CC: Primary care physician; Brayton El, MD

## 2014-10-28 ENCOUNTER — Inpatient Hospital Stay: Payer: Medicare Other

## 2014-10-28 LAB — BASIC METABOLIC PANEL WITH GFR
Anion gap: 10 (ref 5–15)
BUN: 65 mg/dL — ABNORMAL HIGH (ref 6–20)
CO2: 24 mmol/L (ref 22–32)
Calcium: 8.7 mg/dL — ABNORMAL LOW (ref 8.9–10.3)
Chloride: 101 mmol/L (ref 101–111)
Creatinine, Ser: 2.25 mg/dL — ABNORMAL HIGH (ref 0.61–1.24)
GFR calc Af Amer: 26 mL/min — ABNORMAL LOW (ref >60)
GFR calc non Af Amer: 22 mL/min — ABNORMAL LOW (ref >60)
Glucose, Bld: 193 mg/dL — ABNORMAL HIGH (ref 65–99)
Potassium: 5.2 mmol/L — ABNORMAL HIGH (ref 3.5–5.1)
Sodium: 135 mmol/L (ref 135–145)

## 2014-10-28 NOTE — Progress Notes (Signed)
[facility]  ROUNDING NOTE   Subjective:  Pt has had 2 HD sessions. States he's starting to feel a bit better. Still not eating very much. Still has some shortness of breath.  Objective:  Vital signs in last 24 hours:  Temp:  [97.6 F (36.4 C)-98.3 F (36.8 C)] 97.6 F (36.4 C) (09/25 0452) Pulse Rate:  [76-88] 88 (09/25 0809) Resp:  [11-21] 20 (09/25 0452) BP: (103-113)/(73-89) 107/85 mmHg (09/25 0809) SpO2:  [96 %-100 %] 100 % (09/25 0809) Weight:  [85 kg (187 lb 6.3 oz)] 85 kg (187 lb 6.3 oz) (09/24 1005)  Weight change: 2.6 kg (5 lb 11.7 oz) Filed Weights   10/22/2014 1555 10/19/2014 1817 10/27/14 1005  Weight: 82.4 kg (181 lb 10.5 oz) 84 kg (185 lb 3 oz) 85 kg (187 lb 6.3 oz)    Intake/Output: I/O last 3 completed shifts: In: 390 [P.O.:390] Out: 550 [Urine:550]   Intake/Output this shift:     Physical Exam: General: NAD, laying in bed  Head: Normocephalic, atraumatic. Moist oral mucosal membranes  Eyes: Anicteric  Neck: Supple, trachea midline  Lungs:  Bilateral rhonchi, normal effort  Heart: Regular rate and rhythm  Abdomen:  Soft, nontender, BS present  Extremities:  1+ peripheral edema.  Neurologic: Nonfocal, moving all four extremities  Skin: No lesions  Access: Right femoral temp dialysis catheter    Basic Metabolic Panel:  Recent Labs Lab 10/24/14 0428 10/25/14 0420 10/28/2014 0638 10/08/2014 1600 10/27/14 1010 10/28/14 0427  NA 130* 129* 128*  --  130* 135  K 3.2* 3.2* 4.2  --  4.8 5.2*  CL 99* 98* 98*  --  98* 101  CO2 18* 21* 22  --  21* 24  GLUCOSE 163* 172* 157*  --  156* 193*  BUN 71* 79* 91*  --  89* 65*  CREATININE 2.85* 2.88* 3.03*  --  2.79* 2.25*  CALCIUM 8.0* 8.4* 8.2*  --  8.7* 8.7*  PHOS  --   --   --  4.0 3.6  --     Liver Function Tests:  Recent Labs Lab 10/07/2014 1921 10/27/14 1010  AST 81*  --   ALT 14*  --   ALKPHOS 84  --   BILITOT 1.3*  --   PROT 7.5  --   ALBUMIN 3.6 3.2*   No results for  input(s): LIPASE, AMYLASE in the last 168 hours. No results for input(s): AMMONIA in the last 168 hours.  CBC:  Recent Labs Lab 10/19/2014 1921  10/23/14 0443 10/24/14 0428 10/25/14 0420 10/30/2014 0638 10/27/14 1010  WBC 9.5  < > 9.6 9.5 10.0 10.5 16.2*  NEUTROABS 7.3*  --   --   --   --   --   --   HGB 12.4*  < > 10.9* 10.6* 11.0* 11.2* 11.3*  HCT 36.8*  < > 33.4* 31.6* 32.8* 33.3* 34.4*  MCV 94.1  < > 94.3 93.0 92.9 93.2 93.9  PLT 165  < > 154 162 160 151 157  < > = values in this interval not displayed.  Cardiac Enzymes:  Recent Labs Lab 10/19/2014 1921 10/05/2014 2348 10/22/14 0654 10/22/14 1243  TROPONINI 17.10* 19.39* 17.78* 15.62*    BNP: Invalid input(s): POCBNP  CBG:  Recent Labs Lab 10/24/14 0730 10/24/14 1158 10/24/14 1624  GLUCAP 171* 181* 167*    Microbiology: Results for orders placed or performed during the hospital encounter of 10/10/2014  Blood Culture (routine x 2)  Status: None (Preliminary result)   Collection Time: 10/10/2014  7:21 PM  Result Value Ref Range Status   Specimen Description BLOOD LEFT ASSIST CONTROL  Final   Special Requests BOTTLES DRAWN AEROBIC AND ANAEROBIC 5CC  Final   Culture NO GROWTH 4 DAYS  Final   Report Status PENDING  Incomplete  Blood Culture (routine x 2)     Status: None (Preliminary result)   Collection Time: 10/22/2014  7:21 PM  Result Value Ref Range Status   Specimen Description BLOOD LEFT ARM  Final   Special Requests BOTTLES DRAWN AEROBIC AND ANAEROBIC 4CC  Final   Culture NO GROWTH 4 DAYS  Final   Report Status PENDING  Incomplete  Stool culture     Status: None   Collection Time: 10/30/2014 11:36 PM  Result Value Ref Range Status   Specimen Description STOOL  Final   Special Requests Normal  Final   Culture   Final    NO SALMONELLA OR SHIGELLA ISOLATED NO CAMPYLOBACTER DETECTED No Pathogenic E. coli detected    Report Status 10/24/2014 FINAL  Final  C difficile quick scan w PCR reflex     Status: None    Collection Time: 10/07/2014 11:36 PM  Result Value Ref Range Status   C Diff antigen NEGATIVE NEGATIVE Final   C Diff toxin NEGATIVE NEGATIVE Final   C Diff interpretation Negative for C. difficile  Final  Urine culture     Status: None   Collection Time: 10/22/14  5:00 AM  Result Value Ref Range Status   Specimen Description URINE, RANDOM  Final   Special Requests NONE  Final   Culture NO GROWTH 1 DAY  Final   Report Status 10/23/2014 FINAL  Final  Culture, expectorated sputum-assessment     Status: None (Preliminary result)   Collection Time: 10/22/14  5:00 AM  Result Value Ref Range Status   Specimen Description SPUTUM  Final   Special Requests Normal  Final   Sputum evaluation   Final    Sputum specimen not acceptable for testing.  Please recollect.   CALLED LISA SCOTT AT [phone] 10/22/14.PMH   Report Status PENDING  Incomplete  Culture, expectorated sputum-assessment     Status: None   Collection Time: 10/22/14  6:55 PM  Result Value Ref Range Status   Specimen Description SPUTUM  Final   Special Requests Normal  Final   Sputum evaluation THIS SPECIMEN IS ACCEPTABLE FOR SPUTUM CULTURE  Final   Report Status 10/22/2014 FINAL  Final  Culture, respiratory (NON-Expectorated)     Status: None (Preliminary result)   Collection Time: 10/22/14  6:55 PM  Result Value Ref Range Status   Specimen Description SPUTUM  Final   Special Requests Normal Reflexed from [id number]  Final   Gram Stain   Final    GOOD SPECIMEN - 80-90% WBCS FEW WBC SEEN MANY GRAM POSITIVE COCCI IN CLUSTERS FEW GRAM POSITIVE RODS    Culture   Final    LIGHT GROWTH ACHROMOBACTER SPECIES MOLD SENT TO STATE FOR IDENTIFICATION    Report Status PENDING  Incomplete   Organism ID, Bacteria ACHROMOBACTER SPECIES  Final      Susceptibility   Achromobacter species - MIC*    AMPICILLIN Value in next row Resistant      RESISTANT>=32    CEFTAZIDIME Value in next row Sensitive      SENSITIVE4    CEFAZOLIN Value in next  row Resistant      RESISTANT>=64  CEFTRIAXONE Value in next row Resistant      RESISTANT>=64    CIPROFLOXACIN Value in next row Resistant      RESISTANT>=4    GENTAMICIN Value in next row Resistant      RESISTANT>=16    IMIPENEM Value in next row Sensitive      SENSITIVE1    TRIMETH/SULFA Value in next row Sensitive      SENSITIVE<=20    PIP/TAZO Value in next row Sensitive      SENSITIVE<=4    * LIGHT GROWTH ACHROMOBACTER SPECIES    Coagulation Studies: No results for input(s): LABPROT, INR in the last 72 hours.  Urinalysis: No results for input(s): COLORURINE, LABSPEC, PHURINE, GLUCOSEU, HGBUR, BILIRUBINUR, KETONESUR, PROTEINUR, UROBILINOGEN, NITRITE, LEUKOCYTESUR in the last 72 hours.  Invalid input(s): APPERANCEUR    Imaging: No results found.   Medications:     . amiodarone  200 mg Oral Weekly  . aspirin EC  81 mg Oral Daily  . atorvastatin  40 mg Oral q1800  . clopidogrel  75 mg Oral Daily  . doxycycline  100 mg Oral Q12H  . feeding supplement  1 Container Oral TID BM  . feeding supplement (ENSURE ENLIVE)  237 mL Oral BID BM  . finasteride  5 mg Oral Daily  . heparin subcutaneous  5,000 Units Subcutaneous 3 times per day  . mometasone-formoterol  2 puff Inhalation BID  . multivitamin with minerals  1 tablet Oral Daily  . potassium chloride  20 mEq Oral BID  . predniSONE  60 mg Oral Q breakfast  . sodium chloride  3 mL Intravenous Q12H  . tamsulosin  0.4 mg Oral Daily  . timolol  1 drop Both Eyes Daily   sodium chloride, sodium chloride, acetaminophen **OR** acetaminophen, albuterol, ALPRAZolam, alteplase, alum & mag hydroxide-simeth, guaiFENesin, heparin, HYDROcodone-homatropine, lidocaine (PF), lidocaine-prilocaine, pentafluoroprop-tetrafluoroeth, phenol-menthol  Assessment/ Plan:  81 y.o. male with a PMHx of abdominal aortic aneurysm status post open repair 2005, status post CABG with PCI in 2585, chronic systolic heart failure ejection fraction 20-25%,  hypertension, hyperlipidemia, right carotid artery disease, gout, chronic kidney disease stage III Baseline creatinine 1.5 with an EGFR 36 who presented to [facility] with fevers, chills, cough.   1. Acute renal failure/chronic kidney disease stage III baseline Cr 1.5/egfr 36: Renal ultrasound was unremarkable for hydronephrosis. Urinalysis unremarkable. Renal failure likely multifactorial with contributions from nausea, vomiting, diarrhea, underlying heart failure, and pneumonia. -Pt has had HD x2, UOP still only 550cc over the past 24 hours, will therefore proceed with HD again today, reassess for need of HD again tomorrow, serologic work up still pending.  2. Anemia of CKD: hgb 11.3 at last check, would continue to periodically monitor CBC.  No acute indication for procrit at present.   3. Mildly complicated right renal cyst/Acquired renal cyst.Could consider following as outpt, but not a significant acute issue at the moment.  4.  Hyponatremia: Na up to 135 with HD, would cotninue to monitor.    LOS: 7 Priyansh Pry 9/25/20169:53 AM

## 2014-10-28 NOTE — Progress Notes (Signed)
Flutter valve provided for patient use.Demonstrates good effort and understanding

## 2014-10-28 NOTE — Progress Notes (Signed)
Pt back from dialysis. No report received from dialysis nurse.  Attempted to contact dialysis nurse twice for report but no one answered the phone.

## 2014-10-28 NOTE — Progress Notes (Signed)
[facility] [place] [facility]   PATIENT NAME: [name]    MR#:  [id number]  DATE OF BIRTH:  1912/08/24  SUBJECTIVE:  CHIEF COMPLAINT:   Chief Complaint  Patient presents with  . Fever  . Cough  . Diarrhea     Diarrhea is better after avoiding milk products. Now constipated. C/o some dry cough. Had urinary retention 10/24/14- so re-inserted foley. HD again today. Poor UOP.   C/o food getting stuck in upper chest. Chronic but now worse. Odynophagia.  REVIEW OF SYSTEMS:  CONSTITUTIONAL: No fever,positive for fatigue or weakness.  EYES: No blurred or double vision.  EARS, NOSE, AND THROAT: No tinnitus or ear pain.  RESPIRATORY: positive cough,no shortness of breath, wheezing or hemoptysis.  CARDIOVASCULAR: No chest pain, orthopnea, edema.  GASTROINTESTINAL: No nausea, vomiting, positive for diarrhea , no abdominal pain.  GENITOURINARY: No dysuria, hematuria.  ENDOCRINE: No polyuria, nocturia,  HEMATOLOGY: No anemia, easy bruising or bleeding SKIN: No rash or lesion. MUSCULOSKELETAL: right great toe joint pain or arthritis.   NEUROLOGIC: No tingling, numbness, had generalized weakness.  PSYCHIATRY: No anxiety or depression.   ROS  DRUG ALLERGIES:  No Known Allergies  VITALS:  Blood pressure 107/85, pulse 88, temperature 97.6 F (36.4 C), temperature source Oral, resp. rate 20, height  (1.676 m), weight 85 kg (187 lb 6.3 oz), SpO2 100 %.  PHYSICAL EXAMINATION:   GENERAL: 81 y.o.-year-old patient lying in the bed with no acute distress.  EYES: Pupils equal, round, reactive to light and accommodation. No scleral icterus. Extraocular muscles intact.  HEENT: Head atraumatic, normocephalic. Oropharynx and nasopharynx clear.  NECK: Supple, no jugular venous distention. No thyroid enlargement, no tenderness.  LUNGS: Coarse rhonchi heard bilaterally over both lungs, Moving air bilaterally.  mild crackles heard. No use of accessory  muscles of respiration.  CARDIOVASCULAR: S1, S2 normal. No rubs, or gallops. 3/6 systolic murmur present ABDOMEN: Soft, nontender, appears distended. Bowel sounds present. No organomegaly or mass.  EXTREMITIES: No cyanosis, or clubbing. No pedal edema noted on the left foot. Right foot had swelling around first toe- is much better now. NEUROLOGIC: Cranial nerves II through XII are intact. Muscle strength 4/5 in all extremities. Sensation intact. Gait not checked. No focal neurological deficits PSYCHIATRIC: The patient is alert and oriented x 3.  SKIN: No obvious rash, lesion, or ulcer.   Physical Exam LABORATORY PANEL:   CBC  Recent Labs Lab 10/27/14 1010  WBC 16.2*  HGB 11.3*  HCT 34.4*  PLT 157   ------------------------------------------------------------------------------------------------------------------  Chemistries   Recent Labs Lab 10/30/2014 1921  10/28/14 0427  NA 135  < > 135  K 3.4*  < > 5.2*  CL 100*  < > 101  CO2 20*  < > 24  GLUCOSE 159*  < > 193*  BUN 55*  < > 65*  CREATININE 3.02*  < > 2.25*  CALCIUM 8.8*  < > 8.7*  AST 81*  --   --   ALT 14*  --   --   ALKPHOS 84  --   --   BILITOT 1.3*  --   --   < > = values in this interval not displayed. ------------------------------------------------------------------------------------------------------------------  Cardiac Enzymes  Recent Labs Lab 10/22/14 0654 10/22/14 1243  TROPONINI 17.78* 15.62*   ------------------------------------------------------------------------------------------------------------------  RADIOLOGY:  No results found.  ASSESSMENT AND PLAN:   Paolo Okane is a 81 y.o. male with a known history of history of coronary reactive disease  status post bypass graft surgery and PCI, hypertension, AAA repair, chronic bronchitis not on home oxygen, congestive heart failure with systolic dysfunction, last known ejection fraction of 30% presents to the hospital secondary to  worsening right foot big toe pain with redness, fevers, worsening diarrhea and cough.  # Non-ST segment elevation MI-patient is asymptomatic,  -Significant known cardiac history -monitor telemetry, cardiology consult appreciated  -IV heparin drip discontinued after 48 hours, recycle troponins stayed in range of 17 -Blood pressure is on the lower side, so not on beta blockers. Continue statin -Echocardiogram done= low EF. Known history of severe aortic stenosis - no aggressive work ups due to old age. - No ACE inhibitors due to renal failure.  # Acute gouty attack-on low-dose prednisone at home, changeg to 60 mg daily at this time.  # sepsis-blood cultures, sputum cultures and urine cultures ordered at this time. -Chest x-ray with no significant infiltrate, but has respiratory symptoms with productive cough.   likely bronchitis- improving now. -Given cover with broad-spectrum antibiotics for now pending sputum cultures- Changed to Doxy. Blood cx negative.  # Acute renal failure on CKD-likely prerenal and ATN -Renal ultrasound without significant findings. -Now on HD  # subacute diarrhea-going on for almost a month. -No recent use of antibiotics. Stool cultures and stool for C. difficile ordered- negative -Gastroenterology consult appreciated-suggest to avoid milk product. - improved.  # Hypertension-hypotensive in the hospital. Not on any blood pressure medications. -Monitor at this time  # chronic systolic CHF-not fluid overloaded. On HD Check CXR as patient is SOB and coughing. Crackles on exam.  # Dysphagia - odynophagia Check barium swallow Will consult GI as he is complaining of odynophagia now. Discussed with Dr. Marva Panda.  # Urinary retention  Was unable to pass urine once foley removed on 10/24/14   Reinserted foley. He was already on Proscar   Added flomax.  All the records are reviewed and case discussed with Care Management/Social Workerr. Management plans  discussed with the patient, family and they are in agreement.  CODE STATUS: Yes for CPR- No intubation or ventilator.  TOTAL TIME TAKING CARE OF THIS PATIENT: 35 minutes.    Milagros Loll R M.D on 10/28/2014   Between 7am to 6pm - Pager - [phone]  After 6pm go to [email or web address] - password [email or web address]  [facility]  Office  [phone]  CC: Primary care physician; Brayton El, MD

## 2014-10-28 NOTE — Progress Notes (Signed)
Attempted to call dialysis several times to see when patient may be going down. No answer. Patient still does not want to take his daily medications until after dialysis.

## 2014-10-28 NOTE — Progress Notes (Signed)
Dr. Cherylann Ratel at bedside this morning. Patient to have dialysis again today. Foley to remain in place for now as patient had retention, and will also need urine output closely monitored as patient is new to dialysis. Daughter at bedside. Patient eating breakfast. No complaints at this time. Will continue to monitor.  Daniel Nissen, RN

## 2014-10-28 NOTE — Consult Note (Addendum)
GI Inpatient Consult Note Daniel Deem MD  Reason for Consult: Dysphagia   Attending Requesting Consult:Daniel Elpidio Anis, MD  Outpatient Primary Physician: Daniel El, MD  History of Present Illness: [name] is a 81 y.o. male who was admitted to the hospital on 09/20/2014 with a non-ST segment elevation MI, sepsis, acute gouty attack, acute renal failure and some subacute diarrhea. These issues have been managed however patient also expressed that he has been having some difficulty swallowing. He describes this as stopping and hurting in the lower cervical region. This is been going on for about 3 weeks. He has never had to regurgitate foods. Also relates that this issue is getting better as well. He did have a bedside swallow showing a moderate pharyngeal dysphagia and possible portion of the esophageal phase and a GI consult was recommended.  He states his appetite has been decreased for about 2-3 months. His weight has been stable. He is been experiencing no problems with nausea or vomiting. He describes this sensation in the low cervical region as a "bubble". He denies any heartburn. He states he had some diarrhea for a couple of weeks before admission but that has not been an issue since he has been feeling better. Has no history of peptic ulcer disease. He had a colonoscopy a long time ago and is unsure of the findings. He is never had an EGD. He takes no NSAIDs or aspirin products over-the-counter. Does take some of his medications about a half an hour before going to bed.  Past Medical History:  Past Medical History  Diagnosis Date  . AAA (abdominal aortic aneurysm) 2005    Open AAA repair  . S/P CABG (coronary artery bypass graft) 2003    At [facility]; PCI 2004; no LHC since 2004  . Echocardiogram abnormal 11/10    Mild LVH, EF 55%, mild AI, diastolic dysfunction  . Hypertension     Unspecified  . Hyperlipidemia     Mixed  . History of orthostatic hypotension   . Carotid artery  disease     Right basal ganglia CVA 11/10; CTA neck showed 90% RICA stenosis, 75-80% LICA stenosis  . Coronary artery disease     a. s/p CABG 1995 b. PCI in 2004  . Hx of bronchitis   . Pneumonia     HISTORY  . Gout   . History of AAA (abdominal aortic aneurysm) repair 07/09/2014    2008   . CKD (chronic kidney disease)     baseline creatinine of 1.8  . Gout     Problem List: Patient Active Problem List   Diagnosis Date Noted  . Acute renal failure syndrome   . Cough   . Aortic valve stenosis   . Diarrhea   . NSTEMI (non-ST elevated myocardial infarction) 10/24/14  . Need for immunization against influenza 10/11/2014  . Cannot sleep 10/11/2014  . Hyperlipidemia 10/11/2014  . BPH without obstruction/lower urinary tract symptoms 10/11/2014  . COPD (chronic obstructive pulmonary disease) with chronic bronchitis 08/09/2014  . Bronchiectasis 07/30/2014  . Constipation 07/30/2014  . Abnormal ECG 07/30/2014  . Absolute anemia 07/30/2014  . Atherosclerosis of coronary artery 07/30/2014  . Compromised kidney function 07/30/2014  . Dyslipidemia 07/30/2014  . Acute gouty arthritis 07/30/2014  . H/O acute myocardial infarction 07/30/2014  . H/O transient cerebral ischemia 07/30/2014  . Pulmonary alveolar hemorrhage 07/09/2014  . History of AAA (abdominal aortic aneurysm) repair 07/09/2014  . Chronic gout 07/09/2014  . Speech disturbance in adult  07/09/2014  . Atrial fibrillation 02/07/2014  . Tachycardia 10/25/2012  . Chronic systolic congestive heart failure 01/19/2011  . Aortic insufficiency 07/24/2010  . CAD, ARTERY BYPASS GRAFT 08/29/2009  . BRADYCARDIA, CHRONIC 08/29/2009  . HYPERLIPIDEMIA-MIXED 06/26/2008  . HYPERTENSION, UNSPECIFIED 06/26/2008  . CAROTID ARTERY DISEASE 06/26/2008  . PVD 06/26/2008  . Peripheral blood vessel disorder 06/26/2008    Past Surgical History: Past Surgical History  Procedure Laterality Date  . Coronary artery bypass graft  2003  .  Abdominal aortic aneurysm repair  2005    Allergies: No Known Allergies  Home Medications: Prescriptions prior to admission  Medication Sig Dispense Refill Last Dose  . amiodarone (PACERONE) 200 MG tablet Take one tablet by mouth once a week 30 tablet 6 Past Week at Unknown time  . Melatonin 10 MG TABS Take 1 tablet by mouth at bedtime as needed.   PRN at PRN  . albuterol (PROVENTIL HFA;VENTOLIN HFA) 108 (90 BASE) MCG/ACT inhaler Inhale 2 puffs into the lungs every 6 (six) hours as needed for wheezing or shortness of breath. 1 Inhaler 2 PRN at PRN  . ALPRAZolam (XANAX) 1 MG tablet Take 1 tablet (1 mg total) by mouth at bedtime as needed for anxiety. 90 tablet 0 PRN at PRN  . BREO ELLIPTA 100-25 MCG/INH AEPB Take 1 puff by mouth daily.   0 unknown at unknown  . clopidogrel (PLAVIX) 75 MG tablet Take 1 tablet (75 mg total) by mouth daily. 90 tablet 0 unknown at unknown  . docusate sodium (COLACE) 100 MG capsule Take 100 mg by mouth daily as needed.    PRN at PRN  . finasteride (PROSCAR) 5 MG tablet Take 1 tablet (5 mg total) by mouth daily. 90 tablet 0 unknown at unknown  . Multiple Vitamin (MULTIVITAMIN) tablet Take 1 tablet by mouth daily.   unknown at unknown  . potassium chloride 20 MEQ TBCR Take 10 mEq by mouth daily. 60 tablet 3 uknown at unknown  . pravastatin (PRAVACHOL) 40 MG tablet Take 1 tablet (40 mg total) by mouth daily. 90 tablet 0 unknown at unknown  . timolol (TIMOPTIC) 0.5 % ophthalmic solution Place 1 drop into both eyes at bedtime.    unknown at unknown  . torsemide (DEMADEX) 20 MG tablet Take 1 tablet (20 mg total) by mouth daily. (Patient taking differently: Take 20 mg by mouth daily. Do not take more than 1 tablet daily for more than 3 days in a row) 90 tablet 3 unknown at unknown   Home medication reconciliation was completed with the patient.   Scheduled Inpatient Medications:   . amiodarone  200 mg Oral Weekly  . aspirin EC  81 mg Oral Daily  . atorvastatin  40 mg  Oral q1800  . clopidogrel  75 mg Oral Daily  . doxycycline  100 mg Oral Q12H  . feeding supplement  1 Container Oral TID BM  . feeding supplement (ENSURE ENLIVE)  237 mL Oral BID BM  . finasteride  5 mg Oral Daily  . heparin subcutaneous  5,000 Units Subcutaneous 3 times per day  . mometasone-formoterol  2 puff Inhalation BID  . multivitamin with minerals  1 tablet Oral Daily  . predniSONE  60 mg Oral Q breakfast  . sodium chloride  3 mL Intravenous Q12H  . tamsulosin  0.4 mg Oral Daily  . timolol  1 drop Both Eyes Daily    Continuous Inpatient Infusions:     PRN Inpatient Medications:  sodium chloride, sodium chloride,  acetaminophen **OR** acetaminophen, albuterol, ALPRAZolam, alteplase, alum & mag hydroxide-simeth, guaiFENesin, heparin, HYDROcodone-homatropine, lidocaine (PF), lidocaine-prilocaine, pentafluoroprop-tetrafluoroeth, phenol-menthol  Family History: Family history is unknown by patient.   GI Family History: Noncontributory  Social History:   reports that he has never smoked. He has never used smokeless tobacco. He reports that he does not drink alcohol or use illicit drugs. The patient denies ETOH, tobacco, or drug use.   ROS  Review of Systems: 10 systems reviewed per admission history and physical, agree with same.  Physical Examination: BP 114/78 mmHg  Pulse 76  Temp(Src) 97.5 F (36.4 C) (Oral)  Resp 11  Ht  (1.676 m)  Wt 85 kg (187 lb 6.3 oz)  BMI 30.26 kg/m2  SpO2 37% Gen: 81 year old male no acute distress HEENT: Normocephalic atraumatic eyes are anicteric Neck: No JVD Chest: Clear to auscultation CV: Regular rate and rhythm Abd: Soft nontender nondistended bowel sounds positive and normoactive Ext: No clubbing cyanosis or edema Skin: Other:  Data: Lab Results  Component Value Date   WBC 16.2* 10/27/2014   HGB 11.3* 10/27/2014   HCT 34.4* 10/27/2014   MCV 93.9 10/27/2014   PLT 157 10/27/2014    Recent Labs Lab 10/25/14 0420  10/30/2014 0638 10/27/14 1010  HGB 11.0* 11.2* 11.3*   Lab Results  Component Value Date   NA 135 10/28/2014   K 5.2* 10/28/2014   CL 101 10/28/2014   CO2 24 10/28/2014   BUN 65* 10/28/2014   CREATININE 2.25* 10/28/2014   Lab Results  Component Value Date   ALT 14* 10/28/2014   AST 81* 10/10/2014   ALKPHOS 84 10/24/2014   BILITOT 1.3* 10/07/2014   No results for input(s): APTT, INR, PTT in the last 168 hours. CBC Latest Ref Rng 10/27/2014 10/25/2014 10/25/2014  WBC 3.8 - 10.6 K/uL 16.2(H) 10.5 10.0  Hemoglobin 13.0 - 18.0 g/dL 11.3(L) 11.2(L) 11.0(L)  Hematocrit 40.0 - 52.0 % 34.4(L) 33.3(L) 32.8(L)  Platelets 150 - 440 K/uL 157 151 160    STUDIES: Dg Chest 2 View  10/28/2014   CLINICAL DATA:  Fever, cough, diarrhea  EXAM: CHEST  2 VIEW  COMPARISON:  11/01/2014  FINDINGS: Lungs are clear.  Small right pleural effusion.  No pneumothorax.  Cardiomegaly.  Postsurgical changes related to prior CABG.  IMPRESSION: Small right pleural effusion.   Electronically Signed   By: Charline Bills M.D.   On: 10/28/2014 13:02   @  Assessment: 1. Dysphagia recent symptom development. he is not currently a candidate for sedated endoscopic procedure due to the recent non-ST elevated MI and sepsis. There is a broad differential diagnosis however at 89 he likely has some amount of stool dysmotility perhaps even issues such as achalasia although neoplasia is a consideration as well.  Recommendations: 1. We'll do a barium swallow. Full modified barium swallow by SLP after this. 2. I would encourage him to take medications at least an hour before going to bed as if there is some dysmotility in the esophagus some medications may not clearly esophagus well and would cause issues there. 3. Discussed with Dr.Sudini. 4. Following  Thank you for the consult. Please call with questions or concerns.  Daniel Deem, MD  10/28/2014 7:36 PM

## 2014-10-28 NOTE — Progress Notes (Deleted)
Per Dr. Gouru, place plavix on hold for possible biopsy later this week.  

## 2014-10-28 NOTE — Progress Notes (Signed)
Patient scheduled for Barium swallow test today on 10/28/14. Per Pam in Radiology, no one is available to perform the test. Per Dr. Elpidio Anis patient is ok to be moved to tomorrow 10/29/14, will make the patient NPO after midnight.

## 2014-10-29 ENCOUNTER — Inpatient Hospital Stay: Payer: Medicare Other

## 2014-10-29 ENCOUNTER — Encounter: Payer: Self-pay | Admitting: Vascular Surgery

## 2014-10-29 LAB — PROTEIN ELECTROPHORESIS, SERUM
A/G Ratio: 0.9 (ref 0.7–1.7)
ALPHA-1-GLOBULIN: 0.3 g/dL (ref 0.0–0.4)
ALPHA-2-GLOBULIN: 1 g/dL (ref 0.4–1.0)
Albumin ELP: 2.9 g/dL (ref 2.9–4.4)
BETA GLOBULIN: 0.8 g/dL (ref 0.7–1.3)
GAMMA GLOBULIN: 1 g/dL (ref 0.4–1.8)
GLOBULIN, TOTAL: 3.1 g/dL (ref 2.2–3.9)
Total Protein ELP: 6 g/dL (ref 6.0–8.5)

## 2014-10-29 LAB — MPO/PR-3 (ANCA) ANTIBODIES: Myeloperoxidase Abs: <9 U/mL (ref 0.0–9.0)

## 2014-10-29 LAB — BASIC METABOLIC PANEL
Anion gap: 10 (ref 5–15)
BUN: 49 mg/dL — AB (ref 6–20)
CHLORIDE: 101 mmol/L (ref 101–111)
CO2: 28 mmol/L (ref 22–32)
CREATININE: 1.79 mg/dL — AB (ref 0.61–1.24)
Calcium: 8.6 mg/dL — ABNORMAL LOW (ref 8.9–10.3)
GFR calc Af Amer: 34 mL/min — ABNORMAL LOW (ref >60)
GFR, EST NON AFRICAN AMERICAN: 30 mL/min — AB (ref >60)
GLUCOSE: 161 mg/dL — AB (ref 65–99)
POTASSIUM: 4.8 mmol/L (ref 3.5–5.1)
Sodium: 139 mmol/L (ref 135–145)

## 2014-10-29 LAB — CBC WITH DIFFERENTIAL/PLATELET
BAND NEUTROPHILS: 0 %
BASOS PCT: 0 %
Basophils Absolute: 0 10*3/uL (ref 0–0.1)
Blasts: 0 %
EOS PCT: 0 %
Eosinophils Absolute: 0 10*3/uL (ref 0–0.7)
HCT: 33.7 % — ABNORMAL LOW (ref 40.0–52.0)
Hemoglobin: 11.2 g/dL — ABNORMAL LOW (ref 13.0–18.0)
LYMPHS PCT: 12 %
Lymphs Abs: 1.9 10*3/uL (ref 1.0–3.6)
MCH: 31.4 pg (ref 26.0–34.0)
MCHC: 33.3 g/dL (ref 32.0–36.0)
MCV: 94.2 fL (ref 80.0–100.0)
MONO ABS: 1.3 10*3/uL — AB (ref 0.2–1.0)
MONOS PCT: 8 %
Metamyelocytes Relative: 0 %
Myelocytes: 0 %
NEUTROS ABS: 12.9 10*3/uL — AB (ref 1.4–6.5)
NEUTROS PCT: 80 %
NRBC: 4 /100{WBCs} — AB
OTHER: 0 %
PLATELETS: 123 10*3/uL — AB (ref 150–440)
PROMYELOCYTES ABS: 0 %
RBC: 3.58 MIL/uL — ABNORMAL LOW (ref 4.40–5.90)
RDW: 15.2 % — ABNORMAL HIGH (ref 11.5–14.5)
SMEAR REVIEW: DECREASED
WBC: 16.1 10*3/uL — ABNORMAL HIGH (ref 3.8–10.6)

## 2014-10-29 LAB — C4 COMPLEMENT: Complement C4, Body Fluid: 17 mg/dL (ref 14–44)

## 2014-10-29 LAB — KAPPA/LAMBDA LIGHT CHAINS
Kappa free light chain: 44.5 mg/L — ABNORMAL HIGH (ref 3.30–19.40)
Kappa, lambda light chain ratio: 2.22 — ABNORMAL HIGH (ref 0.26–1.65)
LAMDA FREE LIGHT CHAINS: 20.04 mg/L (ref 5.71–26.30)

## 2014-10-29 LAB — CULTURE, BLOOD (ROUTINE X 2)
CULTURE: NO GROWTH
Culture: NO GROWTH

## 2014-10-29 LAB — C3 COMPLEMENT: C3 Complement: 87 mg/dL (ref 82–167)

## 2014-10-29 LAB — GLOMERULAR BASEMENT MEMBRANE ANTIBODIES: GBM AB: 3 U (ref 0–20)

## 2014-10-29 MED ORDER — POLYETHYLENE GLYCOL 3350 17 G PO PACK
17.0000 g | PACK | Freq: Every day | ORAL | Status: DC | PRN
  Administered 2014-10-29: 17 g via ORAL
  Filled 2014-10-29 (×2): qty 1

## 2014-10-29 MED ORDER — DOCUSATE SODIUM 100 MG PO CAPS
200.0000 mg | ORAL_CAPSULE | Freq: Two times a day (BID) | ORAL | Status: DC
  Administered 2014-10-29 – 2014-10-30 (×3): 200 mg via ORAL
  Filled 2014-10-29 (×4): qty 2

## 2014-10-29 NOTE — Progress Notes (Signed)
[facility] - [place] at [facility]   PATIENT NAME: [name]    MR#:  [id number]  DATE OF BIRTH:  1912/03/04  SUBJECTIVE:  CHIEF COMPLAINT:   Chief Complaint  Patient presents with  . Fever  . Cough  . Diarrhea     Diarrhea is better after avoiding milk products. Now constipated. C/o some dry cough. Had urinary retention 10/24/14- so re-inserted foley. HD again today. Poor UOP. Some odynophagia  REVIEW OF SYSTEMS:  CONSTITUTIONAL: No fever,positive for fatigue or weakness.  EYES: No blurred or double vision.  EARS, NOSE, AND THROAT: No tinnitus or ear pain.  RESPIRATORY: positive cough,no shortness of breath, wheezing or hemoptysis.  CARDIOVASCULAR: No chest pain, orthopnea, edema.  GASTROINTESTINAL: No nausea, vomiting, positive for diarrhea , no abdominal pain.  GENITOURINARY: No dysuria, hematuria.  ENDOCRINE: No polyuria, nocturia,  HEMATOLOGY: No anemia, easy bruising or bleeding SKIN: No rash or lesion. MUSCULOSKELETAL: right great toe joint pain or arthritis.   NEUROLOGIC: No tingling, numbness, had generalized weakness.  PSYCHIATRY: No anxiety or depression.   ROS  DRUG ALLERGIES:  No Known Allergies  VITALS:  Blood pressure 106/76, pulse 81, temperature 98 F (36.7 C), temperature source Oral, resp. rate 18, height  (1.676 m), weight 82.146 kg (181 lb 1.6 oz), SpO2 95 %.  PHYSICAL EXAMINATION:   GENERAL: 81 y.o.-year-old patient lying in the bed with no acute distress.  EYES: Pupils equal, round, reactive to light and accommodation. No scleral icterus. Extraocular muscles intact.  HEENT: Head atraumatic, normocephalic. Oropharynx and nasopharynx clear.  NECK: Supple, no jugular venous distention. No thyroid enlargement, no tenderness.  LUNGS: Coarse rhonchi heard bilaterally over both lungs, Moving air bilaterally.  mild crackles heard. No use of accessory muscles of respiration.  CARDIOVASCULAR: S1, S2 normal. No  rubs, or gallops. 3/6 systolic murmur present ABDOMEN: Soft, nontender, appears distended. Bowel sounds present. No organomegaly or mass.  EXTREMITIES: No cyanosis, or clubbing. No pedal edema noted on the left foot. Right foot had swelling around first toe- is much better now. NEUROLOGIC: Cranial nerves II through XII are intact. Muscle strength 4/5 in all extremities. Sensation intact. Gait not checked. No focal neurological deficits PSYCHIATRIC: The patient is alert and oriented x 3.  SKIN: No obvious rash, lesion, or ulcer.   Physical Exam LABORATORY PANEL:   CBC  Recent Labs Lab 10/29/14 0418  WBC 16.1*  HGB 11.2*  HCT 33.7*  PLT 123*   ------------------------------------------------------------------------------------------------------------------  Chemistries   Recent Labs Lab 10/29/14 0418  NA 139  K 4.8  CL 101  CO2 28  GLUCOSE 161*  BUN 49*  CREATININE 1.79*  CALCIUM 8.6*   ------------------------------------------------------------------------------------------------------------------  Cardiac Enzymes  Recent Labs Lab 10/22/14 1243  TROPONINI 15.62*   ------------------------------------------------------------------------------------------------------------------  RADIOLOGY:  Dg Chest 2 View  10/28/2014   CLINICAL DATA:  Fever, cough, diarrhea  EXAM: CHEST  2 VIEW  COMPARISON:  11/01/2014  FINDINGS: Lungs are clear.  Small right pleural effusion.  No pneumothorax.  Cardiomegaly.  Postsurgical changes related to prior CABG.  IMPRESSION: Small right pleural effusion.   Electronically Signed   By: Charline Bills M.D.   On: 10/28/2014 13:02    ASSESSMENT AND PLAN:   Kartik Fernando is a 81 y.o. male with a known history of history of coronary reactive disease status post bypass graft surgery and PCI, hypertension, AAA repair, chronic bronchitis not on home oxygen, congestive heart failure with systolic dysfunction, last known ejection fraction of  30% presents to the hospital secondary to worsening right foot big toe pain with redness, fevers, worsening diarrhea and cough.  # Non-ST segment elevation MI-patient is asymptomatic,  -Significant known cardiac history, monitor telemetry, cardiology consult appreciated  -IV heparin drip discontinued after 48 hours, recycle troponins stayed in range of 17 -Blood pressure is on the lower side, so not on beta blockers. Continue statin -Echocardiogram done= low EF. Known history of severe aortic stenosis - No aggressive work ups due to old age. - No ACE inhibitors due to renal failure.  # Acute gouty attack Stop prednisone.  # Sepsis-blood cultures, sputum cultures and urine cultures ordered at this time. -Chest x-ray with no significant infiltrate, but has respiratory symptoms with productive cough.   likely bronchitis- improving now. - On Doxycycline. Finished prednisone 5 days. Stopped. Blood cx negative.  # Acute renal failure on CKD-likely prerenal and ATN -Renal ultrasound without significant findings. -Now on HD  # Subacute diarrhea-going on for almost a month. -No recent use of antibiotics. Stool cultures and stool for C. difficile ordered- negative -Gastroenterology consult appreciated-suggest to avoid milk product. - improved.  # Hypertension-hypotensive in the hospital. Not on any blood pressure medications. -Monitor at this time  # chronic systolic CHF-not fluid overloaded. On HD CXR showed mild fluid  # Dysphagia - odynophagia Check barium swallow Will consult GI as he is complaining of odynophagia now. Discussed with Dr. Marva Panda.  # Urinary retention  Was unable to pass urine once foley removed on 10/24/14   Reinserted foley. He was already on Proscar   Added flomax.  All the records are reviewed and case discussed with Care Management/Social Workerr. Management plans discussed with the patient, family and they are in agreement.  CODE STATUS: Yes for CPR- No  intubation or ventilator.  TOTAL TIME TAKING CARE OF THIS PATIENT: 35 minutes.    Milagros Loll R M.D on 10/29/2014   Between 7am to 6pm - Pager - [phone]  After 6pm go to [email or web address] - password [email or web address] [facility]  [facility]  Office  [phone]  CC: Primary care physician; Brayton El, MD

## 2014-10-29 NOTE — Progress Notes (Signed)
PT Cancellation Note  Patient Details Name: [name] MRN: [id number] DOB: 04/19/1912   Cancelled Treatment:    Reason Eval/Treat Not Completed: Patient at procedure or test/unavailable (Treatment session attempted. Patient currently off unit for MBSS, then to dialysis afterwards.  Will re-attempt at later time/date as available and medically appropriate.)  Kristen H. Manson Passey, PT, DPT, NCS 10/29/2014, 10:12 AM [phone]

## 2014-10-29 NOTE — Progress Notes (Signed)
HD tx start 

## 2014-10-29 NOTE — Progress Notes (Signed)
Post hd tx 

## 2014-10-29 NOTE — Progress Notes (Signed)
Subjective:  Pt has had several HD sessions. States he's starting to feel a lot better. Still not eating very much. No N/V reported. Chest pressure has resolved. Swallowing study planned today no shortness of breath this am Pain in rt foot has resolved  Objective:  Vital signs in last 24 hours:  Temp:  [97.5 F (36.4 C)-98.1 F (36.7 C)] 98.1 F (36.7 C) (09/26 0406) Pulse Rate:  [64-83] 77 (09/26 0406) Resp:  [11-20] 18 (09/26 0406) BP: (101-114)/(73-87) 106/78 mmHg (09/26 0406) SpO2:  [76 %-97 %] 95 % (09/26 0406) Weight:  [82.146 kg (181 lb 1.6 oz)] 82.146 kg (181 lb 1.6 oz) (09/25 2104)  Weight change: -2.854 kg (-6 lb 4.7 oz) Filed Weights   10/06/2014 1817 10/27/14 1005 10/28/14 2104  Weight: 84 kg (185 lb 3 oz) 85 kg (187 lb 6.3 oz) 82.146 kg (181 lb 1.6 oz)    Intake/Output: I/O last 3 completed shifts: In: 120 [P.O.:120] Out: 33 [Urine:450; Other:440]   Intake/Output this shift:     Physical Exam: General: NAD, laying in bed  Head: Normocephalic, atraumatic. Moist oral mucosal membranes  Eyes: Anicteric  Neck: Supple, trachea midline  Lungs:  Bilateral rhonchi, normal effort  Heart: Regular rate and rhythm  Abdomen:  Soft, nontender, BS present  Extremities:  1+ peripheral edema.  Neurologic: Nonfocal, moving all four extremities  Skin: No lesions  Access: Right femoral temp dialysis catheter    Basic Metabolic Panel:  Recent Labs Lab 10/25/14 0420 10/11/2014 0638 11/01/2014 1600 10/27/14 1010 10/28/14 0427 10/29/14 0418  NA 129* 128*  --  130* 135 139  K 3.2* 4.2  --  4.8 5.2* 4.8  CL 98* 98*  --  98* 101 101  CO2 21* 22  --  21* 24 28  GLUCOSE 172* 157*  --  156* 193* 161*  BUN 79* 91*  --  89* 65* 49*  CREATININE 2.88* 3.03*  --  2.79* 2.25* 1.79*  CALCIUM 8.4* 8.2*  --  8.7* 8.7* 8.6*  PHOS  --   --  4.0 3.6  --   --     Liver Function Tests:  Recent Labs Lab 10/27/14 1010  ALBUMIN 3.2*   No results for input(s): LIPASE, AMYLASE in  the last 168 hours. No results for input(s): AMMONIA in the last 168 hours.  CBC:  Recent Labs Lab 10/24/14 0428 10/25/14 0420 10/28/2014 0638 10/27/14 1010 10/29/14 0418  WBC 9.5 10.0 10.5 16.2* 16.1*  NEUTROABS  --   --   --   --  12.9*  HGB 10.6* 11.0* 11.2* 11.3* 11.2*  HCT 31.6* 32.8* 33.3* 34.4* 33.7*  MCV 93.0 92.9 93.2 93.9 94.2  PLT 162 160 151 157 123*    Cardiac Enzymes:  Recent Labs Lab 10/22/14 1243  TROPONINI 15.62*    BNP: Invalid input(s): POCBNP  CBG:  Recent Labs Lab 10/24/14 0730 10/24/14 1158 10/24/14 1624  GLUCAP 171* 181* 167*    Microbiology: Results for orders placed or performed during the hospital encounter of 10/07/2014  Blood Culture (routine x 2)     Status: None   Collection Time: 10/17/2014  7:21 PM  Result Value Ref Range Status   Specimen Description BLOOD LEFT ASSIST CONTROL  Final   Special Requests BOTTLES DRAWN AEROBIC AND ANAEROBIC 5CC  Final   Culture NO GROWTH 8 DAYS  Final   Report Status 10/29/2014 FINAL  Final  Blood Culture (routine x 2)     Status: None  Collection Time: 10/07/2014  7:21 PM  Result Value Ref Range Status   Specimen Description BLOOD LEFT ARM  Final   Special Requests BOTTLES DRAWN AEROBIC AND ANAEROBIC 4CC  Final   Culture NO GROWTH 8 DAYS  Final   Report Status 10/29/2014 FINAL  Final  Stool culture     Status: None   Collection Time: 10/16/2014 11:36 PM  Result Value Ref Range Status   Specimen Description STOOL  Final   Special Requests Normal  Final   Culture   Final    NO SALMONELLA OR SHIGELLA ISOLATED NO CAMPYLOBACTER DETECTED No Pathogenic E. coli detected    Report Status 10/24/2014 FINAL  Final  C difficile quick scan w PCR reflex     Status: None   Collection Time: 10/22/2014 11:36 PM  Result Value Ref Range Status   C Diff antigen NEGATIVE NEGATIVE Final   C Diff toxin NEGATIVE NEGATIVE Final   C Diff interpretation Negative for C. difficile  Final  Urine culture     Status: None    Collection Time: 10/22/14  5:00 AM  Result Value Ref Range Status   Specimen Description URINE, RANDOM  Final   Special Requests NONE  Final   Culture NO GROWTH 1 DAY  Final   Report Status 10/23/2014 FINAL  Final  Culture, expectorated sputum-assessment     Status: None (Preliminary result)   Collection Time: 10/22/14  5:00 AM  Result Value Ref Range Status   Specimen Description SPUTUM  Final   Special Requests Normal  Final   Sputum evaluation   Final    Sputum specimen not acceptable for testing.  Please recollect.   CALLED LISA SCOTT AT 3710 10/22/14.PMH   Report Status PENDING  Incomplete  Culture, expectorated sputum-assessment     Status: None   Collection Time: 10/22/14  6:55 PM  Result Value Ref Range Status   Specimen Description SPUTUM  Final   Special Requests Normal  Final   Sputum evaluation THIS SPECIMEN IS ACCEPTABLE FOR SPUTUM CULTURE  Final   Report Status 10/22/2014 FINAL  Final  Culture, respiratory (NON-Expectorated)     Status: None (Preliminary result)   Collection Time: 10/22/14  6:55 PM  Result Value Ref Range Status   Specimen Description SPUTUM  Final   Special Requests Normal Reflexed from [id number]  Final   Gram Stain   Final    GOOD SPECIMEN - 80-90% WBCS FEW WBC SEEN MANY GRAM POSITIVE COCCI IN CLUSTERS FEW GRAM POSITIVE RODS    Culture   Final    LIGHT GROWTH ACHROMOBACTER SPECIES MOLD SENT TO STATE FOR IDENTIFICATION    Report Status PENDING  Incomplete   Organism ID, Bacteria ACHROMOBACTER SPECIES  Final      Susceptibility   Achromobacter species - MIC*    AMPICILLIN Value in next row Resistant      RESISTANT>=32    CEFTAZIDIME Value in next row Sensitive      SENSITIVE4    CEFAZOLIN Value in next row Resistant      RESISTANT>=64    CEFTRIAXONE Value in next row Resistant      RESISTANT>=64    CIPROFLOXACIN Value in next row Resistant      RESISTANT>=4    GENTAMICIN Value in next row Resistant      RESISTANT>=16    IMIPENEM Value  in next row Sensitive      SENSITIVE1    TRIMETH/SULFA Value in next row Sensitive  SENSITIVE<=20    PIP/TAZO Value in next row Sensitive      SENSITIVE<=4    * LIGHT GROWTH ACHROMOBACTER SPECIES    Coagulation Studies: No results for input(s): LABPROT, INR in the last 72 hours.  Urinalysis: No results for input(s): COLORURINE, LABSPEC, PHURINE, GLUCOSEU, HGBUR, BILIRUBINUR, KETONESUR, PROTEINUR, UROBILINOGEN, NITRITE, LEUKOCYTESUR in the last 72 hours.  Invalid input(s): APPERANCEUR    Imaging: Dg Chest 2 View  10/28/2014   CLINICAL DATA:  Fever, cough, diarrhea  EXAM: CHEST  2 VIEW  COMPARISON:  10/12/2014  FINDINGS: Lungs are clear.  Small right pleural effusion.  No pneumothorax.  Cardiomegaly.  Postsurgical changes related to prior CABG.  IMPRESSION: Small right pleural effusion.   Electronically Signed   By: Julian Hy M.D.   On: 10/28/2014 13:02     Medications:     . amiodarone  200 mg Oral Weekly  . aspirin EC  81 mg Oral Daily  . atorvastatin  40 mg Oral q1800  . clopidogrel  75 mg Oral Daily  . doxycycline  100 mg Oral Q12H  . feeding supplement  1 Container Oral TID BM  . feeding supplement (ENSURE ENLIVE)  237 mL Oral BID BM  . finasteride  5 mg Oral Daily  . heparin subcutaneous  5,000 Units Subcutaneous 3 times per day  . mometasone-formoterol  2 puff Inhalation BID  . multivitamin with minerals  1 tablet Oral Daily  . predniSONE  60 mg Oral Q breakfast  . sodium chloride  3 mL Intravenous Q12H  . tamsulosin  0.4 mg Oral Daily  . timolol  1 drop Both Eyes Daily   sodium chloride, sodium chloride, acetaminophen **OR** acetaminophen, albuterol, ALPRAZolam, alteplase, alum & mag hydroxide-simeth, guaiFENesin, heparin, HYDROcodone-homatropine, lidocaine (PF), lidocaine-prilocaine, pentafluoroprop-tetrafluoroeth, phenol-menthol  Assessment/ Plan:  81 y.o. male with a PMHx of abdominal aortic aneurysm status post open repair 2005, status post CABG  with PCI in 7322, chronic systolic heart failure ejection fraction 20-25%, hypertension, hyperlipidemia, right carotid artery disease, gout, chronic kidney disease stage III Baseline creatinine 1.5 with an EGFR 36 who presented to [facility] with fevers, chills, cough.   1. Acute renal failure/chronic kidney disease stage III baseline Cr 1.5/egfr 36: Renal ultrasound was unremarkable for hydronephrosis. Urinalysis unremarkable. Renal failure likely multifactorial with contributions from nausea, vomiting, diarrhea, underlying heart failure, and pneumonia. - Plan for another HD today, then hold and see if renal function has recovered - Clinically, patient appears improved  2. Anemia of CKD: hgb 11.2 at last check, would continue to periodically monitor CBC.  No acute indication for procrit at present.   3. Mildly complicated right renal cyst/Acquired renal cyst.Could consider following as outpt, but not a significant acute issue at the moment.  4. Other issues- NSTEMI, Acute gout   LOS: 8 SINGH,HARMEET 9/26/20169:04 AM

## 2014-10-29 NOTE — Progress Notes (Signed)
Pre-hd tx 

## 2014-10-29 NOTE — Consult Note (Signed)
Subjective: Patient seen for dysphagia. Patietn states this problem began when he became ill before admission.  He also states this is improving.  No nausea or abdominal pain.  Objective: Vital signs in last 24 hours: Temp:  [97.9 F (36.6 C)-98.1 F (36.7 C)] 98 F (36.7 C) (09/26 2032) Pulse Rate:  [69-91] 91 (09/26 2032) Resp:  [13-23] 16 (09/26 1630) BP: (100-113)/(72-85) 105/72 mmHg (09/26 2032) SpO2:  [95 %-100 %] 98 % (09/26 1630) Weight:  [80.468 kg (177 lb 6.4 oz)-82.6 kg (182 lb 1.6 oz)] 80.468 kg (177 lb 6.4 oz) (09/26 1630) Blood pressure 105/72, pulse 91, temperature 98 F (36.7 C), temperature source Oral, resp. rate 16, height  (1.676 m), weight 80.468 kg (177 lb 6.4 oz), SpO2 98 %.   Intake/Output from previous day: 09/25 0701 - 09/26 0700 In: 120 [P.O.:120] Out: 690 [Urine:250]  Intake/Output this shift:     General appearance:  81 yo male no acute distress Resp:  Bilateral course rhonchi and wheezes Cardio:  rrr GI:  Soft nontender non distended, bs positive Extremities:     Lab Results: Results for orders placed or performed during the hospital encounter of 10/20/2014 (from the past 24 hour(s))  CBC with Differential/Platelet     Status: Abnormal   Collection Time: 10/29/14  4:18 AM  Result Value Ref Range   WBC 16.1 (H) 3.8 - 10.6 K/uL   RBC 3.58 (L) 4.40 - 5.90 MIL/uL   Hemoglobin 11.2 (L) 13.0 - 18.0 g/dL   HCT 16.1 (L) 09.6 - 04.5 %   MCV 94.2 80.0 - 100.0 fL   MCH 31.4 26.0 - 34.0 pg   MCHC 33.3 32.0 - 36.0 g/dL   RDW 40.9 (H) 81.1 - 91.4 %   Platelets 123 (L) 150 - 440 K/uL   Neutrophils Relative % 80 %   Lymphocytes Relative 12 %   Monocytes Relative 8 %   Eosinophils Relative 0 %   Basophils Relative 0 %   Band Neutrophils 0 %   Metamyelocytes Relative 0 %   Myelocytes 0 %   Promyelocytes Absolute 0 %   Blasts 0 %   nRBC 4 (H) 0 /100 WBC   Other 0 %   Neutro Abs 12.9 (H) 1.4 - 6.5 K/uL   Lymphs Abs 1.9 1.0 - 3.6 K/uL   Monocytes Absolute 1.3 (H) 0.2 - 1.0 K/uL   Eosinophils Absolute 0.0 0 - 0.7 K/uL   Basophils Absolute 0.0 0 - 0.1 K/uL   RBC Morphology MIXED RBC POPULATION    Smear Review PLATELETS APPEAR DECREASED   Basic metabolic panel     Status: Abnormal   Collection Time: 10/29/14  4:18 AM  Result Value Ref Range   Sodium 139 135 - 145 mmol/L   Potassium 4.8 3.5 - 5.1 mmol/L   Chloride 101 101 - 111 mmol/L   CO2 28 22 - 32 mmol/L   Glucose, Bld 161 (H) 65 - 99 mg/dL   BUN 49 (H) 6 - 20 mg/dL   Creatinine, Ser 7.82 (H) 0.61 - 1.24 mg/dL   Calcium 8.6 (L) 8.9 - 10.3 mg/dL   GFR calc non Af Amer 30 (L) >60 mL/min   GFR calc Af Amer 34 (L) >60 mL/min   Anion gap 10 5 - 15      Recent Labs  10/27/14 1010 10/29/14 0418  WBC 16.2* 16.1*  HGB 11.3* 11.2*  HCT 34.4* 33.7*  PLT 157 123*   BMET  Recent Labs  10/27/14 1010 10/28/14 0427 10/29/14 0418  NA 130* 135 139  K 4.8 5.2* 4.8  CL 98* 101 101  CO2 21* 24 28  GLUCOSE 156* 193* 161*  BUN 89* 65* 49*  CREATININE 2.79* 2.25* 1.79*  CALCIUM 8.7* 8.7* 8.6*   LFT  Recent Labs  10/27/14 1010  ALBUMIN 3.2*   PT/INR No results for input(s): LABPROT, INR in the last 72 hours. Hepatitis Panel No results for input(s): HEPBSAG, HCVAB, HEPAIGM, HEPBIGM in the last 72 hours. C-Diff No results for input(s): CDIFFTOX in the last 72 hours. No results for input(s): CDIFFPCR in the last 72 hours.   Studies/Results: Dg Chest 2 View  10/28/2014   CLINICAL DATA:  Fever, cough, diarrhea  EXAM: CHEST  2 VIEW  COMPARISON:  November 06, 2014  FINDINGS: Lungs are clear.  Small right pleural effusion.  No pneumothorax.  Cardiomegaly.  Postsurgical changes related to prior CABG.  IMPRESSION: Small right pleural effusion.   Electronically Signed   By: Charline Bills M.D.   On: 10/28/2014 13:02   Dg Esophagus  10/29/2014   CLINICAL DATA:  81 year old male with difficulty swallowing for the past 2 months. Initial encounter.  EXAM: ESOPHOGRAM/BARIUM  SWALLOW  TECHNIQUE: Single contrast examination was performed using  thin barium.  FLUOROSCOPY TIME:  Radiation Exposure Index (as provided by the fluoroscopic device): 2365.74 micro Gy cm2  COMPARISON:  None.  FINDINGS: No laryngeal penetration or aspiration.  Poor primary esophageal stripping wave.  No esophageal obstructing lesion.  Very small sliding-type hilar hernia with mild smooth narrowing just above this region.  A barium tablet was not ingested secondary to the fact that the patient was in the supine position and may have had difficulty swallowing the pill.  IMPRESSION: No laryngeal penetration or aspiration.  Poor primary esophageal stripping wave.  Very small sliding-type hilar hernia with mild smooth narrowing just above this region.   Electronically Signed   By: Lacy Duverney M.D.   On: 10/29/2014 13:28    Scheduled Inpatient Medications:   . amiodarone  200 mg Oral Weekly  . aspirin EC  81 mg Oral Daily  . atorvastatin  40 mg Oral q1800  . clopidogrel  75 mg Oral Daily  . docusate sodium  200 mg Oral BID  . doxycycline  100 mg Oral Q12H  . feeding supplement  1 Container Oral TID BM  . feeding supplement (ENSURE ENLIVE)  237 mL Oral BID BM  . finasteride  5 mg Oral Daily  . heparin subcutaneous  5,000 Units Subcutaneous 3 times per day  . mometasone-formoterol  2 puff Inhalation BID  . multivitamin with minerals  1 tablet Oral Daily  . sodium chloride  3 mL Intravenous Q12H  . tamsulosin  0.4 mg Oral Daily  . timolol  1 drop Both Eyes Daily    Continuous Inpatient Infusions:     PRN Inpatient Medications:  sodium chloride, sodium chloride, acetaminophen **OR** acetaminophen, albuterol, ALPRAZolam, alteplase, alum & mag hydroxide-simeth, guaiFENesin, heparin, HYDROcodone-homatropine, lidocaine (PF), lidocaine-prilocaine, pentafluoroprop-tetrafluoroeth, phenol-menthol, polyethylene glycol  Miscellaneous:   Assessment:  1. Dysphagia-ugis showing possible ring in the distal  esophagus.  Poor esophageal stripping wave.  Current clinical situation patient is not a candidate for sedated procedure.  Plan:  1) recommend GI fu in 3-4 weeks for possible further management of dysphagia.  Will sign off, reconsult as needed.   [name] MD 10/29/2014, 9:45 PM

## 2014-10-29 NOTE — Progress Notes (Signed)
Post diaylsis BP 107/73, HR 84. Per Dr. Elpidio Anis okay to administer amiodarone.

## 2014-10-29 NOTE — Care Management Important Message (Signed)
Important Message  Patient Details  Name: [name] MRN: [id number] Date of Birth: Oct 16, 1912   Medicare Important Message Given:  Yes-fourth notification given    [name] 10/29/2014, 9:31 AM

## 2014-10-29 NOTE — Progress Notes (Signed)
Per Dr. Elpidio Anis, can resume previous diet when patient returns from test, prior to dialysis. Patient prefers to take daily meds after dialysis.

## 2014-10-29 NOTE — Progress Notes (Signed)
HD tx completed.

## 2014-10-29 NOTE — Clinical Documentation Improvement (Signed)
Hospitalist   Please exercise your independent, professional judgment when responding. A specific answer is not anticipated or expected.  Please choose the clarification below as appropriate:  The attending physician is required to clarify conflicting documentation in the medical record.  The following documentation is noted in the MEDICAL RECORD NUMBER Diagnosis:  pneumonia documented multiple times but unclear if it is only  "history of" or if it were  "present on admission" or  "developed during this admission"   Please clarify the appropriate diagnosis for this patient.   Thank You, Cruzita Lederer Health Information Management [place] [phone]

## 2014-10-30 ENCOUNTER — Inpatient Hospital Stay: Payer: Medicare Other

## 2014-10-30 ENCOUNTER — Other Ambulatory Visit: Payer: Self-pay | Admitting: Family Medicine

## 2014-10-30 LAB — BLOOD GAS, ARTERIAL
ACID-BASE EXCESS: 1.3 mmol/L (ref 0.0–3.0)
Allens test (pass/fail): POSITIVE — AB
Bicarbonate: 25 mEq/L (ref 21.0–28.0)
FIO2: 0.21
O2 SAT: 86.2 %
PATIENT TEMPERATURE: 37
PCO2 ART: 36 mmHg (ref 32.0–48.0)
pH, Arterial: 7.45 (ref 7.350–7.450)
pO2, Arterial: 49 mmHg — ABNORMAL LOW (ref 83.0–108.0)

## 2014-10-30 MED ORDER — ENSURE ENLIVE PO LIQD: 237.0000 mL | Freq: Two times a day (BID) | ORAL | Status: DC

## 2014-10-30 MED ORDER — ONDANSETRON HCL 4 MG/2ML IJ SOLN
4.0000 mg | Freq: Four times a day (QID) | INTRAMUSCULAR | Status: DC | PRN
Start: 2014-10-30 — End: 2014-11-02
  Administered 2014-10-30: 4 mg via INTRAVENOUS
  Filled 2014-10-30: qty 2

## 2014-10-30 MED ORDER — SODIUM CHLORIDE 0.9 % IV BOLUS (SEPSIS): 250.0000 mL | Freq: Once | INTRAVENOUS | Status: DC

## 2014-10-30 MED ORDER — ALPRAZOLAM 0.5 MG PO TABS
0.5000 mg | ORAL_TABLET | Freq: Every day | ORAL | Status: DC | PRN
  Administered 2014-10-30: 0.5 mg via ORAL
  Filled 2014-10-30: qty 1

## 2014-10-30 MED ORDER — ALPRAZOLAM 1 MG PO TABS
1.0000 mg | ORAL_TABLET | Freq: Every day | ORAL | Status: DC
  Administered 2014-10-30: 1 mg via ORAL
  Filled 2014-10-30: qty 1

## 2014-10-30 NOTE — Progress Notes (Signed)
Pt also complaining for nausea - order for IV zofran  Q6h PRN. No complaints of pain. O2 97% on Room Air.

## 2014-10-30 NOTE — Progress Notes (Signed)
Subjective:  Pt has had several HD sessions. His daughter reports that after yesterday's dialysis session, he was somewhat agitated and restless. EGD shows Poor primary esophageal stripping wave His daughter reports that he has poor appetite. He is not eating any of the meals. She is requesting soft diet  Objective:  Vital signs in last 24 hours:  Temp:  [97.9 F (36.6 C)-98.1 F (36.7 C)] 97.9 F (36.6 C) (09/27 0446) Pulse Rate:  [69-91] 76 (09/27 0446) Resp:  [13-23] 18 (09/27 0446) BP: (100-113)/(72-85) 106/79 mmHg (09/27 0446) SpO2:  [95 %-100 %] 98 % (09/27 0446) Weight:  [80.468 kg (177 lb 6.4 oz)-82.6 kg (182 lb 1.6 oz)] 80.468 kg (177 lb 6.4 oz) (09/26 1630)  Weight change: 0.454 kg (1 lb) Filed Weights   10/29/14 1200 10/29/14 1558 10/29/14 1630  Weight: 82.6 kg (182 lb 1.6 oz) 81.4 kg (179 lb 7.3 oz) 80.468 kg (177 lb 6.4 oz)    Intake/Output: I/O last 3 completed shifts: In: 120 [P.O.:120] Out: 1960 [Urine:520; Other:1440]   Intake/Output this shift:     Physical Exam: General: NAD, laying in bed  Head: Normocephalic, atraumatic. Moist oral mucosal membranes  Eyes: Anicteric  Neck: Supple, trachea midline  Lungs:  Bilateral rhonchi, normal effort  Heart: Regular rate and rhythm  Abdomen:  Soft, nontender, BS present  Extremities:  1+ peripheral edema.  Neurologic: Nonfocal, moving all four extremities  Skin: No lesions  Access: Right femoral temp dialysis catheter    Basic Metabolic Panel:  Recent Labs Lab 10/25/14 0420 10/14/2014 0638 10/29/2014 1600 10/27/14 1010 10/28/14 0427 10/29/14 0418  NA 129* 128*  --  130* 135 139  K 3.2* 4.2  --  4.8 5.2* 4.8  CL 98* 98*  --  98* 101 101  CO2 21* 22  --  21* 24 28  GLUCOSE 172* 157*  --  156* 193* 161*  BUN 79* 91*  --  89* 65* 49*  CREATININE 2.88* 3.03*  --  2.79* 2.25* 1.79*  CALCIUM 8.4* 8.2*  --  8.7* 8.7* 8.6*  PHOS  --   --  4.0 3.6  --   --     Liver Function Tests:  Recent Labs Lab  10/27/14 1010  ALBUMIN 3.2*   No results for input(s): LIPASE, AMYLASE in the last 168 hours. No results for input(s): AMMONIA in the last 168 hours.  CBC:  Recent Labs Lab 10/24/14 0428 10/25/14 0420 10/04/2014 0638 10/27/14 1010 10/29/14 0418  WBC 9.5 10.0 10.5 16.2* 16.1*  NEUTROABS  --   --   --   --  12.9*  HGB 10.6* 11.0* 11.2* 11.3* 11.2*  HCT 31.6* 32.8* 33.3* 34.4* 33.7*  MCV 93.0 92.9 93.2 93.9 94.2  PLT 162 160 151 157 123*    Cardiac Enzymes: No results for input(s): CKTOTAL, CKMB, CKMBINDEX, TROPONINI in the last 168 hours.  BNP: Invalid input(s): POCBNP  CBG:  Recent Labs Lab 10/24/14 0730 10/24/14 1158 10/24/14 1624  GLUCAP 171* 181* 167*    Microbiology: Results for orders placed or performed during the hospital encounter of 10/11/2014  Blood Culture (routine x 2)     Status: None   Collection Time: 11/01/2014  7:21 PM  Result Value Ref Range Status   Specimen Description BLOOD LEFT ASSIST CONTROL  Final   Special Requests BOTTLES DRAWN AEROBIC AND ANAEROBIC 5CC  Final   Culture NO GROWTH 8 DAYS  Final   Report Status 10/29/2014 FINAL  Final  Blood Culture (  routine x 2)     Status: None   Collection Time: 10/07/2014  7:21 PM  Result Value Ref Range Status   Specimen Description BLOOD LEFT ARM  Final   Special Requests BOTTLES DRAWN AEROBIC AND ANAEROBIC 4CC  Final   Culture NO GROWTH 8 DAYS  Final   Report Status 10/29/2014 FINAL  Final  Stool culture     Status: None   Collection Time: 10/24/2014 11:36 PM  Result Value Ref Range Status   Specimen Description STOOL  Final   Special Requests Normal  Final   Culture   Final    NO SALMONELLA OR SHIGELLA ISOLATED NO CAMPYLOBACTER DETECTED No Pathogenic E. coli detected    Report Status 10/24/2014 FINAL  Final  C difficile quick scan w PCR reflex     Status: None   Collection Time: 10/09/2014 11:36 PM  Result Value Ref Range Status   C Diff antigen NEGATIVE NEGATIVE Final   C Diff toxin NEGATIVE  NEGATIVE Final   C Diff interpretation Negative for C. difficile  Final  Urine culture     Status: None   Collection Time: 10/22/14  5:00 AM  Result Value Ref Range Status   Specimen Description URINE, RANDOM  Final   Special Requests NONE  Final   Culture NO GROWTH 1 DAY  Final   Report Status 10/23/2014 FINAL  Final  Culture, expectorated sputum-assessment     Status: None (Preliminary result)   Collection Time: 10/22/14  5:00 AM  Result Value Ref Range Status   Specimen Description SPUTUM  Final   Special Requests Normal  Final   Sputum evaluation   Final    Sputum specimen not acceptable for testing.  Please recollect.   CALLED LISA SCOTT AT [phone] 10/22/14.PMH   Report Status PENDING  Incomplete  Culture, expectorated sputum-assessment     Status: None   Collection Time: 10/22/14  6:55 PM  Result Value Ref Range Status   Specimen Description SPUTUM  Final   Special Requests Normal  Final   Sputum evaluation THIS SPECIMEN IS ACCEPTABLE FOR SPUTUM CULTURE  Final   Report Status 10/22/2014 FINAL  Final  Culture, respiratory (NON-Expectorated)     Status: None (Preliminary result)   Collection Time: 10/22/14  6:55 PM  Result Value Ref Range Status   Specimen Description SPUTUM  Final   Special Requests Normal Reflexed from [id number]  Final   Gram Stain   Final    GOOD SPECIMEN - 80-90% WBCS FEW WBC SEEN MANY GRAM POSITIVE COCCI IN CLUSTERS FEW GRAM POSITIVE RODS    Culture   Final    LIGHT GROWTH ACHROMOBACTER SPECIES MOLD SENT TO STATE FOR IDENTIFICATION    Report Status PENDING  Incomplete   Organism ID, Bacteria ACHROMOBACTER SPECIES  Final      Susceptibility   Achromobacter species - MIC*    AMPICILLIN Value in next row Resistant      RESISTANT>=32    CEFTAZIDIME Value in next row Sensitive      SENSITIVE4    CEFAZOLIN Value in next row Resistant      RESISTANT>=64    CEFTRIAXONE Value in next row Resistant      RESISTANT>=64    CIPROFLOXACIN Value in next row  Resistant      RESISTANT>=4    GENTAMICIN Value in next row Resistant      RESISTANT>=16    IMIPENEM Value in next row Sensitive      SENSITIVE1  TRIMETH/SULFA Value in next row Sensitive      SENSITIVE<=20    PIP/TAZO Value in next row Sensitive      SENSITIVE<=4    * LIGHT GROWTH ACHROMOBACTER SPECIES    Coagulation Studies: No results for input(s): LABPROT, INR in the last 72 hours.  Urinalysis: No results for input(s): COLORURINE, LABSPEC, PHURINE, GLUCOSEU, HGBUR, BILIRUBINUR, KETONESUR, PROTEINUR, UROBILINOGEN, NITRITE, LEUKOCYTESUR in the last 72 hours.  Invalid input(s): APPERANCEUR    Imaging: Dg Chest 2 View  10/28/2014   CLINICAL DATA:  Fever, cough, diarrhea  EXAM: CHEST  2 VIEW  COMPARISON:  10/15/2014  FINDINGS: Lungs are clear.  Small right pleural effusion.  No pneumothorax.  Cardiomegaly.  Postsurgical changes related to prior CABG.  IMPRESSION: Small right pleural effusion.   Electronically Signed   By: Julian Hy M.D.   On: 10/28/2014 13:02   Dg Esophagus  10/29/2014   CLINICAL DATA:  81 year old male with difficulty swallowing for the past 2 months. Initial encounter.  EXAM: ESOPHOGRAM/BARIUM SWALLOW  TECHNIQUE: Single contrast examination was performed using  thin barium.  FLUOROSCOPY TIME:  Radiation Exposure Index (as provided by the fluoroscopic device): 2365.74 micro Gy cm2  COMPARISON:  None.  FINDINGS: No laryngeal penetration or aspiration.  Poor primary esophageal stripping wave.  No esophageal obstructing lesion.  Very small sliding-type hilar hernia with mild smooth narrowing just above this region.  A barium tablet was not ingested secondary to the fact that the patient was in the supine position and may have had difficulty swallowing the pill.  IMPRESSION: No laryngeal penetration or aspiration.  Poor primary esophageal stripping wave.  Very small sliding-type hilar hernia with mild smooth narrowing just above this region.   Electronically Signed    By: Genia Del M.D.   On: 10/29/2014 13:28     Medications:     . amiodarone  200 mg Oral Weekly  . aspirin EC  81 mg Oral Daily  . atorvastatin  40 mg Oral q1800  . clopidogrel  75 mg Oral Daily  . docusate sodium  200 mg Oral BID  . doxycycline  100 mg Oral Q12H  . feeding supplement  1 Container Oral TID BM  . feeding supplement (ENSURE ENLIVE)  237 mL Oral BID BM  . finasteride  5 mg Oral Daily  . heparin subcutaneous  5,000 Units Subcutaneous 3 times per day  . mometasone-formoterol  2 puff Inhalation BID  . multivitamin with minerals  1 tablet Oral Daily  . sodium chloride  3 mL Intravenous Q12H  . tamsulosin  0.4 mg Oral Daily  . timolol  1 drop Both Eyes Daily   sodium chloride, sodium chloride, acetaminophen **OR** acetaminophen, albuterol, ALPRAZolam, alteplase, alum & mag hydroxide-simeth, guaiFENesin, heparin, HYDROcodone-homatropine, lidocaine (PF), lidocaine-prilocaine, pentafluoroprop-tetrafluoroeth, phenol-menthol, polyethylene glycol  Assessment/ Plan:  81 y.o. male with a PMHx of abdominal aortic aneurysm status post open repair 2005, status post CABG with PCI in 2800, chronic systolic heart failure ejection fraction 20-25%, hypertension, hyperlipidemia, right carotid artery disease, gout, chronic kidney disease stage III Baseline creatinine 1.5 with an EGFR 36 who presented to [facility] with fevers, chills, cough.   1. Acute renal failure/chronic kidney disease stage III baseline Cr 1.5/egfr 36: Renal ultrasound was unremarkable for hydronephrosis. Urinalysis unremarkable. Renal failure likely multifactorial with contributions from nausea, vomiting, diarrhea, underlying heart failure, and pneumonia. - Hold further dialysis and see if renal function has recovered - Clinically, patient appears improved  2. Anemia  of CKD: hgb 11.2 at last check, would continue to periodically monitor CBC.  No acute indication for procrit at present.   3.  Mildly complicated right renal cyst/Acquired renal cyst.Could consider following as outpt, but not a significant acute issue at the moment.  4. Other issues- NSTEMI, Acute gout, dysphagia (Dr Gustavo Lah)   LOS: 9 SINGH,HARMEET 9/27/20169:24 AM

## 2014-10-30 NOTE — Progress Notes (Signed)
[facility] - [place] at [facility]   PATIENT NAME: [name]    MR#:  [id number]  DATE OF BIRTH:  July 25, 1912  SUBJECTIVE:  CHIEF COMPLAINT:   Chief Complaint  Patient presents with  . Fever  . Cough  . Diarrhea     Diarrhea is better after avoiding milk products. Now constipated. C/o some dry cough that is improved. Had urinary retention 10/24/14- so re-inserted foley. Poor UOP. Some odynophagia which is improving. No HD today.  REVIEW OF SYSTEMS:  CONSTITUTIONAL: No fever,positive for fatigue or weakness.  EYES: No blurred or double vision.  EARS, NOSE, AND THROAT: No tinnitus or ear pain.  RESPIRATORY: positive cough,no shortness of breath, wheezing or hemoptysis.  CARDIOVASCULAR: No chest pain, orthopnea, edema.  GASTROINTESTINAL: No nausea, vomiting, positive for diarrhea , no abdominal pain.  GENITOURINARY: No dysuria, hematuria.  ENDOCRINE: No polyuria, nocturia,  HEMATOLOGY: No anemia, easy bruising or bleeding SKIN: No rash or lesion. MUSCULOSKELETAL: right great toe joint pain or arthritis.   NEUROLOGIC: No tingling, numbness, had generalized weakness.  PSYCHIATRY: No anxiety or depression.   ROS  DRUG ALLERGIES:  No Known Allergies  VITALS:  Blood pressure 101/74, pulse 77, temperature 97.4 F (36.3 C), temperature source Oral, resp. rate 19, height  (1.676 m), weight 80.468 kg (177 lb 6.4 oz), SpO2 98 %.  PHYSICAL EXAMINATION:   GENERAL: 81 y.o.-year-old patient lying in the bed with no acute distress.  EYES: Pupils equal, round, reactive to light and accommodation. No scleral icterus. Extraocular muscles intact.  HEENT: Head atraumatic, normocephalic. Oropharynx and nasopharynx clear.  NECK: Supple, no jugular venous distention. No thyroid enlargement, no tenderness.  LUNGS: Coarse rhonchi heard bilaterally over both lungs, Moving air bilaterally.  mild crackles heard. No use of accessory muscles of respiration.   CARDIOVASCULAR: S1, S2 normal. No rubs, or gallops. 3/6 systolic murmur present ABDOMEN: Soft, nontender, appears distended. Bowel sounds present. No organomegaly or mass.  EXTREMITIES: No cyanosis, or clubbing. No pedal edema noted on the left foot. Right foot had swelling around first toe- is much better now. NEUROLOGIC: Cranial nerves II through XII are intact. Muscle strength 4/5 in all extremities. Sensation intact. Gait not checked. No focal neurological deficits PSYCHIATRIC: The patient is alert and oriented x 3.  SKIN: No obvious rash, lesion, or ulcer.   Physical Exam LABORATORY PANEL:   CBC  Recent Labs Lab 10/29/14 0418  WBC 16.1*  HGB 11.2*  HCT 33.7*  PLT 123*   ------------------------------------------------------------------------------------------------------------------  Chemistries   Recent Labs Lab 10/29/14 0418  NA 139  K 4.8  CL 101  CO2 28  GLUCOSE 161*  BUN 49*  CREATININE 1.79*  CALCIUM 8.6*   ------------------------------------------------------------------------------------------------------------------  Cardiac Enzymes No results for input(s): TROPONINI in the last 168 hours. ------------------------------------------------------------------------------------------------------------------  RADIOLOGY:  Dg Esophagus  10/29/2014   CLINICAL DATA:  81 year old male with difficulty swallowing for the past 2 months. Initial encounter.  EXAM: ESOPHOGRAM/BARIUM SWALLOW  TECHNIQUE: Single contrast examination was performed using  thin barium.  FLUOROSCOPY TIME:  Radiation Exposure Index (as provided by the fluoroscopic device): 2365.74 micro Gy cm2  COMPARISON:  None.  FINDINGS: No laryngeal penetration or aspiration.  Poor primary esophageal stripping wave.  No esophageal obstructing lesion.  Very small sliding-type hilar hernia with mild smooth narrowing just above this region.  A barium tablet was not ingested secondary to the fact that the  patient was in the supine position and may have had difficulty swallowing  the pill.  IMPRESSION: No laryngeal penetration or aspiration.  Poor primary esophageal stripping wave.  Very small sliding-type hilar hernia with mild smooth narrowing just above this region.   Electronically Signed   By: Lacy Duverney M.D.   On: 10/29/2014 13:28    ASSESSMENT AND PLAN:   [name] is a 81 y.o. male with a known history of history of coronary reactive disease status post bypass graft surgery and PCI, hypertension, AAA repair, chronic bronchitis not on home oxygen, congestive heart failure with systolic dysfunction, last known ejection fraction of 30% presents to the hospital secondary to worsening right foot big toe pain with redness, fevers, worsening diarrhea and cough.  # Non-ST segment elevation MI-patient is asymptomatic,  -Significant known cardiac history, monitor telemetry, cardiology consult appreciated  -IV heparin drip discontinued after 48 hours, recycle troponins stayed in range of 17 -Blood pressure is on the lower side, so not on beta blockers. Continue statin -Echocardiogram done= low EF. Known history of severe aortic stenosis - No aggressive work ups due to old age. - No ACE inhibitors due to renal failure.  # Acute gouty attack - resolved Stoped prednisone.  # Acute bronchitis -Chest x-ray with no significant infiltrate, but has respiratory symptoms with productive cough.   likely bronchitis- improving now. - On Doxycycline. Finished prednisone 5 days. Stopped. Blood cx negative.  # Acute renal failure on CKD-likely prerenal and ATN -Renal ultrasound without significant findings. -Now on HD  # Subacute diarrhea-going on for almost a month. -No recent use of antibiotics. Stool cultures and stool for C. difficile ordered- negative -Gastroenterology consult appreciated-suggest to avoid milk product. - improved.  # Hypertension-hypotensive in the hospital. Not on any blood  pressure medications. -Monitor at this time  # chronic systolic CHF-not fluid overloaded. On HD  # Dysphagia - odynophagia Checked barium swallow Improving. OP GI f/u  # Urinary retention  Was unable to pass urine once foley removed on 10/24/14   Reinserted foley. He was already on Proscar   Added flomax.  All the records are reviewed and case discussed with Care Management/Social Workerr. Management plans discussed with the patient, family and they are in agreement.  CODE STATUS: Yes for CPR- No intubation or ventilator.  TOTAL TIME TAKING CARE OF THIS PATIENT: 35 minutes.    Milagros Loll R M.D on 10/30/2014   Between 7am to 6pm - Pager - [phone]  After 6pm go to [email or web address] - password [email or web address] [facility]  [facility]  Office  [phone]  CC: Primary care physician; Brayton El, MD

## 2014-10-30 NOTE — Progress Notes (Signed)
Physical Therapy Treatment Patient Details Name: [name] MRN: [id number] DOB: [birth year] Today's Date: 10/30/2014    History of Present Illness Pt admitted to hospital after c/o fever, chills, diarrhea, and a cough. Testing revealed that pt had an NSTEMI. Troponin levels also found to be elevated (peak at 19.3). Plan is to not pursue any aggressive cardiac intervention at this point due to advanced age.     PT Comments    Attempted treatment initially and pt with ST; returned later and pt agreeable to bed exercise. Pt has temporary femoral catheter on R. Pt fatigued from a busy morning, but able to participate well. Continue PT as appropriate to progress strength and functional mobility as appropriate.   Follow Up Recommendations  Home health PT;Supervision - Intermittent     Equipment Recommendations  Rolling walker with 5" wheels    Recommendations for Other Services       Precautions / Restrictions Restrictions Weight Bearing Restrictions: No    Mobility  Bed Mobility               General bed mobility comments: Not tested; temporary femoral catheter  Transfers                 General transfer comment: Not tested; temporary femoral catheter  Ambulation/Gait                 Stairs            Wheelchair Mobility    Modified Rankin (Stroke Patients Only)       Balance                                    Cognition Arousal/Alertness: Awake/alert Behavior During Therapy: WFL for tasks assessed/performed Overall Cognitive Status: Within Functional Limits for tasks assessed                      Exercises Total Joint Exercises Ankle Circles/Pumps: AROM;Both;20 reps;Supine General Exercises - Lower Extremity Quad Sets: Strengthening;Both;20 reps;Supine Gluteal Sets: Strengthening;Both;20 reps;Supine Short Arc Quad: AROM;Both;20 reps;Supine Heel Slides: AAROM;20 reps;Supine;Left Hip ABduction/ADduction:  Strengthening;Both;Supine;10 reps    General Comments        Pertinent Vitals/Pain Pain Assessment: No/denies pain    Home Living                      Prior Function            PT Goals (current goals can now be found in the care plan section) Progress towards PT goals: Progressing toward goals    Frequency  Min 2X/week    PT Plan Current plan remains appropriate    Co-evaluation             End of Session Equipment Utilized During Treatment: Oxygen Activity Tolerance: Patient tolerated treatment well Patient left: in bed;with call bell/phone within reach;with bed alarm set;with family/visitor present     Time: 4098-1191 PT Time Calculation (min) (ACUTE ONLY): 23 min  Charges:  $Therapeutic Exercise: 23-37 mins                    G Codes:      Kristeen Miss 10/30/2014, 12:06 PM

## 2014-10-30 NOTE — Progress Notes (Signed)
Dr. Elpidio Anis notified - patient complaining of restlessness, agitation. Order for .  xanax daily PRN. Ok for patient to be to [facility] or chair per MD.

## 2014-10-30 NOTE — Progress Notes (Signed)
Speech Language Pathology Treatment:    Patient Details Name: [name] MRN: [id number] DOB: 04/16/1912 Today's Date: 10/30/2014 Time: 1030-1130 SLP Time Calculation (min) (ACUTE ONLY): 60 min  Assessment / Plan / Recommendation Clinical Impression  Pt presents with baseline congested cough, noted intermittently throughout ST visit (including after PO trials periodically). Pt consumed ~2 ounces of water by cup, taking small single sips with no immediate overt s/s of aspiration (aside from cough noted at baseline). Pt consumed ~7-10 tsps of apple sauce with no immediate overt s/s of aspiration. Pt had similar presentation with solid trials. With all trials, Pt presented with a clear vocal quality between trials and no decline of respiratory status throughout intake. Oral phase within functional limits on all trials given. Recommended upgraded diet to dysphagia 3 (mechanical soft) with thin liquids (no straw). Follow aspiration precautions. Education given to both patient and family member. Pt self fed, with set up.   HPI Other Pertinent Information: Pt is a 81y/o male with a hx of MI. pt with no hx dysphagia noted. pt reports having difficulty swallowing and having sensation of food and liquid getting "stuck" at base of throat.    Pertinent Vitals Pain Assessment: No/denies pain  SLP Plan       Recommendations Diet recommendations: Dysphagia 3 (mechanical soft);Thin liquid (no straw; small bites/sips) Liquids provided via: Cup;No straw Medication Administration: Whole meds with puree Supervision: Patient able to self feed (rec only one visitor with tray to discourage talking w/meals) Compensations: Follow solids with liquid;Slow rate;Small sips/bites;Minimize environmental distractions Postural Changes and/or Swallow Maneuvers: Seated upright 90 degrees              Oral Care Recommendations: Oral care BID;Patient independent with oral care;Staff/trained caregiver to provide oral care     GO     [name] 10/30/2014, 11:39 AM

## 2014-10-30 NOTE — Care Management (Signed)
BUN and Creatinine have decreased after several dialysis treatments.  Further dialysis treatments will be held to monitor whether renal function has recovered

## 2014-10-30 NOTE — Progress Notes (Signed)
Dr. Luberta Mutter at bedside. Updated on patient representation over the past couple hours and recent results. She wants to stop the bolus and place and order for PRN deep suction by RT. At this time patient is sleeping, but easily aroused. Alert and oriented. No pain. Gurgling has significantly improved and patient states he is feeling better. Will continue to monitor and update next shift RN.

## 2014-10-30 NOTE — Progress Notes (Signed)
Nutrition Follow-up     INTERVENTION:   Meals and Snacks: Cater to patient preferences, will send bedtime snack Medical Food Supplement Therapy: pt does not like Boost Breeze; will discontinue. Likes EnsureEnlive, prefers Chocolate. These products are Lactose Free but not milk product free; pt appears to be tolerating these at present without diarrhea. Continue as tolerated   NUTRITION DIAGNOSIS:   Inadequate oral intake related to poor appetite as evidenced by per patient/family report.  GOAL:   Patient will meet greater than or equal to 90% of their needs   MONITOR:    (Energy Intake, Anthropometrics, Electrolyte.Renal Profile, Glucose Profile, Digestive System)  REASON FOR ASSESSMENT:   Consult  (lactose free )  ASSESSMENT:    Pt received several dialysis sessions, further dialysis on hold at this time, pt with dysphagia/odynophagia, GI and SLP following  Diet Order:  DIET DYS 3 Room service appropriate?: Yes; Fluid consistency:: Thin; SLP following  Energy Intake: pt awaiting new meal tray on visit today after diet progression, did not like puree diet with thickened liquids, did not eat much breakfast this AM for this reason. Recorded po intake sips/bites. Family thinks that the reason pt has not been eating is he does not like the Puree foods, diet upgraded today. Pt does like the Ensure, drinking some. Pt reports therapy brought him an ice cream today and he tolerated this  Last BM:   no diarrhea, now constipated  Electrolyte and Renal Profile:  Recent Labs Lab 10/13/2014 1600 10/27/14 1010 10/28/14 0427 10/29/14 0418  BUN  --  89* 65* 49*  CREATININE  --  2.79* 2.25* 1.79*  NA  --  130* 135 139  K  --  4.8 5.2* 4.8  PHOS 4.0 3.6  --   --    Glucose Profile: No results for input(s): GLUCAP in the last 72 hours.  Meds: MVI  Height:   Ht Readings from Last 1 Encounters:  10/10/2014  (1.676 m)    Weight:   Wt Readings from Last 1 Encounters:   10/29/14 177 lb 6.4 oz (80.468 kg)   Filed Weights   10/29/14 1200 10/29/14 1558 10/29/14 1630  Weight: 182 lb 1.6 oz (82.6 kg) 179 lb 7.3 oz (81.4 kg) 177 lb 6.4 oz (80.468 kg)    BMI:  Body mass index is 28.65 kg/(m^2).  Estimated Nutritional Needs:   Kcal:  1610-9604 kcals (BEE 1310, 1.3 AF, 1.1-1.3 IF)  Protein:  91-114 g (1.2-1.5 gkg)   Fluid:  1000 mL plus UOP  HIGH Care Level  Romelle Starcher MS, RD, LDN [phone] Pager

## 2014-10-30 NOTE — Progress Notes (Signed)
Pt resting quietly, says he may feel a little bit better but not much. Still sounds very congested. MD notified of Vitals, ABG, CXR. MD does not want to try deep suctioning yet. Orders to give 250cc bolus now. Dr. Elpidio Anis to ask night shift MD to round and assess patient in the next hour or so. May possibly want bipap overnight, but no orders at this time. Will continue to monitor.

## 2014-10-30 NOTE — Progress Notes (Signed)
Patient can't seem to get comfortable and states he just really doesn't feel good and the 'gurgling' in his chest is worse. Xanax given earlier for restlessness helped slightly, but patient still does not feel well at all. Dr. Elpidio Anis notified. Orders to go ahead and give a breathing treatment, order a STAT ABG, and 2 view Chest Xray. MD to review results and make changes as needed.

## 2014-10-31 DIAGNOSIS — Z515 Encounter for palliative care: Secondary | ICD-10-CM

## 2014-10-31 DIAGNOSIS — J690 Pneumonitis due to inhalation of food and vomit: Secondary | ICD-10-CM

## 2014-10-31 DIAGNOSIS — R339 Retention of urine, unspecified: Secondary | ICD-10-CM

## 2014-10-31 LAB — BASIC METABOLIC PANEL
Anion gap: 14 (ref 5–15)
BUN: 64 mg/dL — ABNORMAL HIGH (ref 6–20)
CALCIUM: 8.4 mg/dL — AB (ref 8.9–10.3)
CO2: 28 mmol/L (ref 22–32)
CREATININE: 2.48 mg/dL — AB (ref 0.61–1.24)
Chloride: 96 mmol/L — ABNORMAL LOW (ref 101–111)
GFR calc non Af Amer: 20 mL/min — ABNORMAL LOW (ref >60)
GFR, EST AFRICAN AMERICAN: 23 mL/min — AB (ref >60)
Glucose, Bld: 153 mg/dL — ABNORMAL HIGH (ref 65–99)
Potassium: 5.3 mmol/L — ABNORMAL HIGH (ref 3.5–5.1)
SODIUM: 138 mmol/L (ref 135–145)

## 2014-10-31 LAB — CBC WITH DIFFERENTIAL/PLATELET
Basophils Absolute: 0.3 10*3/uL — ABNORMAL HIGH (ref 0–0.1)
EOS ABS: 0 10*3/uL (ref 0–0.7)
Eosinophils Relative: 0 %
HCT: 34.3 % — ABNORMAL LOW (ref 40.0–52.0)
HEMOGLOBIN: 10.9 g/dL — AB (ref 13.0–18.0)
Lymphocytes Relative: 8 %
Lymphs Abs: 1.4 10*3/uL (ref 1.0–3.6)
MCH: 30.6 pg (ref 26.0–34.0)
MCHC: 31.9 g/dL — AB (ref 32.0–36.0)
MCV: 96 fL (ref 80.0–100.0)
MONO ABS: 1 10*3/uL (ref 0.2–1.0)
Neutro Abs: 15.3 10*3/uL — ABNORMAL HIGH (ref 1.4–6.5)
PLATELETS: 97 10*3/uL — AB (ref 150–440)
RBC: 3.57 MIL/uL — ABNORMAL LOW (ref 4.40–5.90)
RDW: 16.1 % — AB (ref 11.5–14.5)
WBC: 17.9 10*3/uL — ABNORMAL HIGH (ref 3.8–10.6)

## 2014-10-31 LAB — UIFE/LIGHT CHAINS/TP QN, 24-HR UR
FREE KAPPA/LAMBDA RATIO: 24.83 — AB (ref 2.04–10.37)
Free Lambda Lt Chains,Ur: 14.9 mg/L — ABNORMAL HIGH (ref 0.24–6.66)
Free Lt Chn Excr Rate: 370 mg/L — ABNORMAL HIGH (ref 1.35–24.19)
TIME-UPE24: 400 h
Total Protein, Urine: 18.8 mg/dL

## 2014-10-31 MED ORDER — MORPHINE SULFATE (PF) 2 MG/ML IV SOLN
1.0000 mg | INTRAVENOUS | Status: DC | PRN
  Administered 2014-11-01 – 2014-11-02 (×2): 1 mg via INTRAVENOUS
  Filled 2014-10-31 (×3): qty 1

## 2014-10-31 MED ORDER — CETYLPYRIDINIUM CHLORIDE 0.05 % MT LIQD
7.0000 mL | Freq: Two times a day (BID) | OROMUCOSAL | Status: DC
  Administered 2014-10-31 – 2014-11-01 (×2): 7 mL via OROMUCOSAL

## 2014-10-31 MED ORDER — LORAZEPAM 2 MG/ML IJ SOLN: 0.5000 mg | Freq: Four times a day (QID) | INTRAMUSCULAR | Status: DC | PRN

## 2014-10-31 MED ORDER — SODIUM CHLORIDE 0.9 % IV SOLN
500.0000 mg | Freq: Two times a day (BID) | INTRAVENOUS | Status: DC
  Administered 2014-10-31 – 2014-11-01 (×3): 500 mg via INTRAVENOUS
  Filled 2014-10-31 (×5): qty 0.5

## 2014-10-31 NOTE — Progress Notes (Signed)
PT Cancellation Note  Patient Details Name: [name] MRN: [id number] DOB: [birth year]   Cancelled Treatment:    Reason Eval/Treat Not Completed: Medical issues which prohibited therapy (Pt with decline in respiratory status overnight due to possible aspiration pneumonia. Pt's RN states that he is not fit for PT at this time. )   Georgina Peer 10/31/2014, 10:50 AM

## 2014-10-31 NOTE — Care Management (Signed)
Patient has had a marked decline in status.  There is now concern that patient has an aspiration pneumonia.  He is now NPO and on IV antibiotics. Palliative care consult present.  The reason was for goals of treatment- patient may require dialysis at discharge.  Since that order was written attending has spoken with family and family will discuss as to whether to make patient DNR rather than current order for partial code.  Family has requested IV antibiotics for one more day to see if patient status improves.  It is clear that they do not want patient to suffer. Palliative care consult is pending.

## 2014-10-31 NOTE — Progress Notes (Addendum)
ANTIBIOTIC CONSULT NOTE - INITIAL  Pharmacy Consult for Meropenem Indication: pneumonia  No Known Allergies  Patient Measurements: Height:  (167.6 cm) Weight: 177 lb 6.4 oz (80.468 kg) IBW/kg (Calculated) : 63.8  Vital Signs: Temp: 98.3 F (36.8 C) (09/28 0453) Temp Source: Oral (09/28 0453) BP: 84/53 mmHg (09/28 0453) Pulse Rate: 76 (09/28 0453) Intake/Output from previous day: 09/27 0701 - 09/28 0700 In: 0  Out: 250 [Urine:250] Intake/Output from this shift:    Labs:  Recent Labs  10/29/14 0418 10/31/14 0424  WBC 16.1* 17.9*  HGB 11.2* 10.9*  PLT 123* 97*  CREATININE 1.79* 2.48*   Estimated Creatinine Clearance: 16.2 mL/min (by C-G formula based on Cr of 2.48). No results for input(s): VANCOTROUGH, VANCOPEAK, VANCORANDOM, GENTTROUGH, GENTPEAK, GENTRANDOM, TOBRATROUGH, TOBRAPEAK, TOBRARND, AMIKACINPEAK, AMIKACINTROU, AMIKACIN in the last 72 hours.   Microbiology: Recent Results (from the past 720 hour(s))  Blood Culture (routine x 2)     Status: None   Collection Time: 2014/11/12  7:21 PM  Result Value Ref Range Status   Specimen Description BLOOD LEFT ASSIST CONTROL  Final   Special Requests BOTTLES DRAWN AEROBIC AND ANAEROBIC 5CC  Final   Culture NO GROWTH 8 DAYS  Final   Report Status 10/29/2014 FINAL  Final  Blood Culture (routine x 2)     Status: None   Collection Time: Nov 12, 2014  7:21 PM  Result Value Ref Range Status   Specimen Description BLOOD LEFT ARM  Final   Special Requests BOTTLES DRAWN AEROBIC AND ANAEROBIC 4CC  Final   Culture NO GROWTH 8 DAYS  Final   Report Status 10/29/2014 FINAL  Final  Stool culture     Status: None   Collection Time: 12-Nov-2014 11:36 PM  Result Value Ref Range Status   Specimen Description STOOL  Final   Special Requests Normal  Final   Culture   Final    NO SALMONELLA OR SHIGELLA ISOLATED NO CAMPYLOBACTER DETECTED No Pathogenic E. coli detected    Report Status 10/24/2014 FINAL  Final  C difficile quick scan w  PCR reflex     Status: None   Collection Time: Nov 12, 2014 11:36 PM  Result Value Ref Range Status   C Diff antigen NEGATIVE NEGATIVE Final   C Diff toxin NEGATIVE NEGATIVE Final   C Diff interpretation Negative for C. difficile  Final  Urine culture     Status: None   Collection Time: 10/22/14  5:00 AM  Result Value Ref Range Status   Specimen Description URINE, RANDOM  Final   Special Requests NONE  Final   Culture NO GROWTH 1 DAY  Final   Report Status 10/23/2014 FINAL  Final  Culture, expectorated sputum-assessment     Status: None (Preliminary result)   Collection Time: 10/22/14  5:00 AM  Result Value Ref Range Status   Specimen Description SPUTUM  Final   Special Requests Normal  Final   Sputum evaluation   Final    Sputum specimen not acceptable for testing.  Please recollect.   CALLED LISA SCOTT AT [phone] 10/22/14.PMH   Report Status PENDING  Incomplete  Culture, expectorated sputum-assessment     Status: None   Collection Time: 10/22/14  6:55 PM  Result Value Ref Range Status   Specimen Description SPUTUM  Final   Special Requests Normal  Final   Sputum evaluation THIS SPECIMEN IS ACCEPTABLE FOR SPUTUM CULTURE  Final   Report Status 10/22/2014 FINAL  Final  Culture, respiratory (NON-Expectorated)  Status: None (Preliminary result)   Collection Time: 10/22/14  6:55 PM  Result Value Ref Range Status   Specimen Description SPUTUM  Final   Special Requests Normal Reflexed from [id number]  Final   Gram Stain   Final    GOOD SPECIMEN - 80-90% WBCS FEW WBC SEEN MANY GRAM POSITIVE COCCI IN CLUSTERS FEW GRAM POSITIVE RODS    Culture   Final    LIGHT GROWTH ACHROMOBACTER SPECIES MOLD SENT TO STATE FOR IDENTIFICATION    Report Status PENDING  Incomplete   Organism ID, Bacteria ACHROMOBACTER SPECIES  Final      Susceptibility   Achromobacter species - MIC*    AMPICILLIN Value in next row Resistant      RESISTANT>=32    CEFTAZIDIME Value in next row Sensitive      SENSITIVE4     CEFAZOLIN Value in next row Resistant      RESISTANT>=64    CEFTRIAXONE Value in next row Resistant      RESISTANT>=64    CIPROFLOXACIN Value in next row Resistant      RESISTANT>=4    GENTAMICIN Value in next row Resistant      RESISTANT>=16    IMIPENEM Value in next row Sensitive      SENSITIVE1    TRIMETH/SULFA Value in next row Sensitive      SENSITIVE<=20    PIP/TAZO Value in next row Sensitive      SENSITIVE<=4    * LIGHT GROWTH ACHROMOBACTER SPECIES    Medical History: Past Medical History  Diagnosis Date  . AAA (abdominal aortic aneurysm) 2005    Open AAA repair  . S/P CABG (coronary artery bypass graft) 2003    At [facility]; PCI 2004; no LHC since 2004  . Echocardiogram abnormal 11/10    Mild LVH, EF 55%, mild AI, diastolic dysfunction  . Hypertension     Unspecified  . Hyperlipidemia     Mixed  . History of orthostatic hypotension   . Carotid artery disease     Right basal ganglia CVA 11/10; CTA neck showed 90% RICA stenosis, 75-80% LICA stenosis  . Coronary artery disease     a. s/p CABG 1995 b. PCI in 2004  . Hx of bronchitis   . Pneumonia     HISTORY  . Gout   . History of AAA (abdominal aortic aneurysm) repair 07/09/2014    2008   . CKD (chronic kidney disease)     baseline creatinine of 1.8  . Gout     Medications:  Scheduled:  . ALPRAZolam  1 mg Oral Q2000  . amiodarone  200 mg Oral Weekly  . aspirin EC  81 mg Oral Daily  . atorvastatin  40 mg Oral q1800  . clopidogrel  75 mg Oral Daily  . docusate sodium  200 mg Oral BID  . feeding supplement (ENSURE ENLIVE)  237 mL Oral BID BM  . finasteride  5 mg Oral Daily  . heparin subcutaneous  5,000 Units Subcutaneous 3 times per day  . meropenem (MERREM) IV  500 mg Intravenous Q12H  . mometasone-formoterol  2 puff Inhalation BID  . multivitamin with minerals  1 tablet Oral Daily  . sodium chloride  250 mL Intravenous Once  . sodium chloride  3 mL Intravenous Q12H  . tamsulosin  0.4 mg Oral Daily  .  timolol  1 drop Both Eyes Daily   Assessment: 81 yo male who was admitted for acute NSTEMI who has been experiencing respiratory symptoms  and productive cough thought to be bronchitis.  Pt received vanc and zosyn from 9/19-9/21 and was also on doxycycline from 9/19-9/27. WBC remains elevated and is increasing.  Blood/sputum/urine cultures negative.  Respiratory cultures grew achromobacter. Pharmacy consulted to start meropenem.  Goal of Therapy:  Resolution of infection  Plan:  Will start meropenem  IV Q12H based on renal function of crcl 16.2 ml/min. Expected duration 7 days with resolution of temperature and/or normalization of WBC  Karis Juba Clinical Pharmacist 10/31/2014,7:49 AM

## 2014-10-31 NOTE — Consult Note (Signed)
Palliative Medicine Inpatient Consult Note   Name: [name] Date: 10/31/2014 MRN: [id number]  DOB: [birth year]  Referring Physician: Milagros Loll, MD  Palliative Care consult requested for this 81 y.o. male for goals of medical therapy in patient with multiple end stage conditions.    IMPRESSION: 1.  Aspiration Pneumonia 2. Non STEMI  3. Acute gouty attack --resolved 4. Acute renal failure on CKD due to ATN and prerenal causes 5.  Diarrhea (for a month)  C Diff neg 6.  Chronic Systolic CHF w/o fluid overload --on HD 7.  Dysphagia and odynophagia 8.  Urinary Retention 9.  Anxiety/ restlessness 10. Inadequate oral intake  PLAN:  I plan to talk with family tomorrow.   I have initiated this consult, but will need time to have counselling sessions w/ pt/ family.  Pt reportedly is still aware of what his situation is and can provide input into his choices.  Just not at this moment. I  Have talked with Dr Elpidio Anis about pts medical history and have reviewed the chart. He will likely benefit from being offered comfort care only measures.      REVIEW OF SYSTEMS:  Patient is not able to provide ROS due to being asleep   SPIRITUAL SUPPORT SYSTEM: Yes --family.  SOCIAL HISTORY:  reports that he has never smoked. He has never used smokeless tobacco. He reports that he does not drink alcohol or use illicit drugs.  LEGAL DOCUMENTS:  None  CODE STATUS: Limited code (CPR Ok but no vent or intubation)  PAST MEDICAL HISTORY: Past Medical History  Diagnosis Date  . AAA (abdominal aortic aneurysm) 2005    Open AAA repair  . S/P CABG (coronary artery bypass graft) 2003    At [facility]; PCI 2004; no LHC since 2004  . Echocardiogram abnormal 11/10    Mild LVH, EF 55%, mild AI, diastolic dysfunction  . Hypertension     Unspecified  . Hyperlipidemia     Mixed  . History of orthostatic hypotension   . Carotid artery disease     Right basal ganglia CVA 11/10; CTA neck showed 90% RICA  stenosis, 75-80% LICA stenosis  . Coronary artery disease     a. s/p CABG 1995 b. PCI in 2004  . Hx of bronchitis   . Pneumonia     HISTORY  . Gout   . History of AAA (abdominal aortic aneurysm) repair 07/09/2014    2008   . CKD (chronic kidney disease)     baseline creatinine of 1.8  . Gout     PAST SURGICAL HISTORY:  Past Surgical History  Procedure Laterality Date  . Coronary artery bypass graft  2003  . Abdominal aortic aneurysm repair  2005  . Peripheral vascular catheterization Right 11/22/2014    Procedure: Dialysis/Perma Catheter Repair;  Surgeon: Annice Needy, MD;  Location: [facility] INVASIVE CV LAB;  Service: Cardiovascular;  Laterality: Right;    ALLERGIES:  has No Known Allergies.  MEDICATIONS:  Current Facility-Administered Medications  Medication Dose Route Frequency Provider Last Rate Last Dose  . 0.9 %  sodium chloride infusion  100 mL Intravenous PRN Munsoor Lateef, MD      . 0.9 %  sodium chloride infusion  100 mL Intravenous PRN Munsoor Lateef, MD      . acetaminophen (TYLENOL) tablet 650 mg  650 mg Oral Q6H PRN Enid Baas, MD   650 mg at 10/27/14 0042   Or  . acetaminophen (TYLENOL) suppository 650 mg  650 mg  Rectal Q6H PRN Enid Baas, MD      . albuterol (PROVENTIL) (2.5 MG/3ML) 0.083% nebulizer solution 3 mL  3 mL Inhalation Q6H PRN Enid Baas, MD   3 mL at 10/30/14 1533  . ALPRAZolam Prudy Feeler) tablet 0.5 mg  0.5 mg Oral Daily PRN Milagros Loll, MD   0.5 mg at 10/30/14 1427  . ALPRAZolam Prudy Feeler) tablet 1 mg  1 mg Oral Q2000 Milagros Loll, MD   1 mg at 10/30/14 2121  . alteplase (CATHFLO ACTIVASE) injection 2 mg  2 mg Intracatheter Once PRN Munsoor Lateef, MD      . alum & mag hydroxide-simeth (MAALOX/MYLANTA) 200-200-20 MG/5ML suspension 30 mL  30 mL Oral Q6H PRN Altamese Dilling, MD   30 mL at 10/25/14 1446  . amiodarone (PACERONE) tablet 200 mg  200 mg Oral Weekly Enid Baas, MD   200 mg at 10/29/14 1704  . aspirin EC tablet 81 mg   81 mg Oral Daily Enid Baas, MD   81 mg at 10/30/14 1007  . atorvastatin (LIPITOR) tablet 40 mg  40 mg Oral q1800 Altamese Dilling, MD   40 mg at 10/30/14 2120  . clopidogrel (PLAVIX) tablet 75 mg  75 mg Oral Daily Enid Baas, MD   75 mg at 10/30/14 1007  . docusate sodium (COLACE) capsule 200 mg  200 mg Oral BID Milagros Loll, MD   200 mg at 10/30/14 2124  . feeding supplement (ENSURE ENLIVE) (ENSURE ENLIVE) liquid 237 mL  237 mL Oral BID BM Srikar Sudini, MD   237 mL at 10/30/14 1500  . finasteride (PROSCAR) tablet 5 mg  5 mg Oral Daily Enid Baas, MD   5 mg at 10/30/14 1007  . guaiFENesin (ROBITUSSIN) 100 MG/5ML solution 100 mg  5 mL Oral Q4H PRN Altamese Dilling, MD   100 mg at 10/31/14 0509  . heparin injection 1,000 Units  1,000 Units Dialysis PRN Munsoor Lateef, MD   3,600 Units at 10/28/14 1955  . heparin injection 5,000 Units  5,000 Units Subcutaneous 3 times per day Altamese Dilling, MD   5,000 Units at 10/31/14 0509  . HYDROcodone-homatropine (HYCODAN) 5-1.5 MG/5ML syrup 5 mL  5 mL Oral Q6H PRN Enid Baas, MD   5 mL at 10/27/14 0210  . lidocaine (PF) (XYLOCAINE) 1 % injection 5 mL  5 mL Intradermal PRN Munsoor Lateef, MD      . lidocaine-prilocaine (EMLA) cream 1 application  1 application Topical PRN Munsoor Lateef, MD      . meropenem (MERREM) 500 mg in sodium chloride 0.9 % 50 mL IVPB  500 mg Intravenous Q12H Srikar Sudini, MD      . mometasone-formoterol (DULERA) 100-5 MCG/ACT inhaler 2 puff  2 puff Inhalation BID Enid Baas, MD   2 puff at 10/30/14 2000  . multivitamin with minerals tablet 1 tablet  1 tablet Oral Daily Enid Baas, MD   1 tablet at 10/30/14 1007  . ondansetron (ZOFRAN) injection 4 mg  4 mg Intravenous Q6H PRN Milagros Loll, MD   4 mg at 10/30/14 1541  . pentafluoroprop-tetrafluoroeth (GEBAUERS) aerosol 1 application  1 application Topical PRN Munsoor Lateef, MD      . phenol-menthol (CEPASTAT) lozenge 1 lozenge   1 lozenge Buccal PRN Altamese Dilling, MD      . polyethylene glycol (MIRALAX / GLYCOLAX) packet 17 g  17 g Oral Daily PRN Milagros Loll, MD   17 g at 10/29/14 1641  . sodium chloride 0.9 % bolus 250 mL  250  mL Intravenous Once Milagros Loll, MD   250 mL at 10/30/14 1743  . sodium chloride 0.9 % injection 3 mL  3 mL Intravenous Q12H Enid Baas, MD   3 mL at 10/30/14 2124  . tamsulosin (FLOMAX) capsule 0.4 mg  0.4 mg Oral Daily Altamese Dilling, MD   0.4 mg at 10/30/14 1007  . timolol (TIMOPTIC) 0.5 % ophthalmic solution 1 drop  1 drop Both Eyes Daily Enid Baas, MD   1 drop at 10/30/14 1007    Vital Signs: BP 84/53 mmHg  Pulse 76  Temp(Src) 98.3 F (36.8 C) (Oral)  Resp 20  Ht  (1.676 m)  Wt 80.468 kg (177 lb 6.4 oz)  BMI 28.65 kg/m2  SpO2 98% Filed Weights   10/29/14 1200 10/29/14 1558 10/29/14 1630  Weight: 82.6 kg (182 lb 1.6 oz) 81.4 kg (179 lb 7.3 oz) 80.468 kg (177 lb 6.4 oz)    Estimated body mass index is 28.65 kg/(m^2) as calculated from the following:   Height as of this encounter:  (1.676 m).   Weight as of this encounter: 80.468 kg (177 lb 6.4 oz).  PERFORMANCE STATUS (ECOG) : 4 - Bedbound  PHYSICAL EXAM: Lying in bed --sleeping  Eyes closed  Temporal wasting Hrt rrr no m Lungs cta ant Skin pale   LABS: CBC:    Component Value Date/Time   WBC 17.9* 10/31/2014 0424   WBC 13.2* 05/20/2014 0417   HGB 10.9* 10/31/2014 0424   HGB 11.6* 05/20/2014 0417   HCT 34.3* 10/31/2014 0424   HCT 34.8* 05/20/2014 0417   PLT 97* 10/31/2014 0424   PLT 114* 05/20/2014 0417   MCV 96.0 10/31/2014 0424   MCV 94 05/20/2014 0417   NEUTROABS 15.3* 10/31/2014 0424   NEUTROABS 9.8* 05/20/2014 0417   LYMPHSABS 1.4 10/31/2014 0424   LYMPHSABS 2.4 05/20/2014 0417   MONOABS 1.0 10/31/2014 0424   MONOABS 0.9 05/20/2014 0417   EOSABS 0.0 10/31/2014 0424   EOSABS 0.1 05/20/2014 0417   BASOSABS 0.3* 10/31/2014 0424   BASOSABS 0.0 05/20/2014 0417    Comprehensive Metabolic Panel:    Component Value Date/Time   NA 138 10/31/2014 0424   NA 133* 05/20/2014 0417   NA 142 04/06/2013 0935   K 5.3* 10/31/2014 0424   K 4.2 05/20/2014 0417   CL 96* 10/31/2014 0424   CL 101 05/20/2014 0417   CO2 28 10/31/2014 0424   CO2 27 05/20/2014 0417   BUN 64* 10/31/2014 0424   BUN 39* 05/20/2014 0417   BUN 51* 04/06/2013 0935   CREATININE 2.48* 10/31/2014 0424   CREATININE 1.56* 05/20/2014 0417   GLUCOSE 153* 10/31/2014 0424   GLUCOSE 125* 05/20/2014 0417   GLUCOSE 67 04/06/2013 0935   CALCIUM 8.4* 10/31/2014 0424   CALCIUM 8.4* 05/20/2014 0417   AST 81* 10/24/2014 1921   AST 32 10/16/2012 1801   ALT 14* 10/29/2014 1921   ALT 18 10/16/2012 1801   ALKPHOS 84 10/30/2014 1921   ALKPHOS 87 10/16/2012 1801   BILITOT 1.3* 10/19/2014 1921   BILITOT 0.8 10/16/2012 1801   PROT 7.5 10/16/2014 1921   PROT 8.1 10/16/2012 1801   PROT 6.9 03/10/2012 1511   ALBUMIN 3.2* 10/27/2014 1010   ALBUMIN 3.8 10/16/2012 1801     More than 50% of the visit was spent in counseling/coordination of care: Yes  Time Spent: 50 minutes

## 2014-10-31 NOTE — Progress Notes (Signed)
Subjective:  Pt has had several HD sessions. Overall doing poorly Reports coughing Chest x-ray shows right basilar consolidation suggestive of aspiration pneumonia.  Serum creatinine is worse today to 2.48 Potassium slightly elevated at 5.3  Objective:  Vital signs in last 24 hours:  Temp:  [97.4 F (36.3 C)-99 F (37.2 C)] 98.3 F (36.8 C) (09/28 0453) Pulse Rate:  [65-83] 76 (09/28 0453) Resp:  [19-26] 20 (09/28 0453) BP: (83-101)/(53-74) 84/53 mmHg (09/28 0453) SpO2:  [93 %-98 %] 98 % (09/28 0453) FiO2 (%):  [28 %] 28 % (09/27 1820)  Weight change:  Filed Weights   10/29/14 1200 10/29/14 1558 10/29/14 1630  Weight: 82.6 kg (182 lb 1.6 oz) 81.4 kg (179 lb 7.3 oz) 80.468 kg (177 lb 6.4 oz)    Intake/Output: I/O last 3 completed shifts: In: 0  Out: 320 [Urine:320]   Intake/Output this shift:     Physical Exam: General: NAD, laying in bed  Head: Normocephalic, atraumatic. Moist oral mucosal membranes  Eyes: Anicteric  Neck: Supple, trachea midline  Lungs:  Bilateral rhonchi, normal effort  Heart: Regular rate and rhythm  Abdomen:  Soft, nontender, BS present  Extremities:  1+ peripheral edema.  Neurologic: Nonfocal, moving all four extremities  Skin: No lesions  Access: Right femoral temp dialysis catheter    Basic Metabolic Panel:  Recent Labs Lab 10/04/2014 0638 10/15/2014 1600 10/27/14 1010 10/28/14 0427 10/29/14 0418 10/31/14 0424  NA 128*  --  130* 135 139 138  K 4.2  --  4.8 5.2* 4.8 5.3*  CL 98*  --  98* 101 101 96*  CO2 22  --  21* 24 28 28   GLUCOSE 157*  --  156* 193* 161* 153*  BUN 91*  --  89* 65* 49* 64*  CREATININE 3.03*  --  2.79* 2.25* 1.79* 2.48*  CALCIUM 8.2*  --  8.7* 8.7* 8.6* 8.4*  PHOS  --  4.0 3.6  --   --   --     Liver Function Tests:  Recent Labs Lab 10/27/14 1010  ALBUMIN 3.2*   No results for input(s): LIPASE, AMYLASE in the last 168 hours. No results for input(s): AMMONIA in the last 168 hours.  CBC:  Recent  Labs Lab 10/25/14 0420 10/18/2014 0638 10/27/14 1010 10/29/14 0418 10/31/14 0424  WBC 10.0 10.5 16.2* 16.1* 17.9*  NEUTROABS  --   --   --  12.9* 15.3*  HGB 11.0* 11.2* 11.3* 11.2* 10.9*  HCT 32.8* 33.3* 34.4* 33.7* 34.3*  MCV 92.9 93.2 93.9 94.2 96.0  PLT 160 151 157 123* 97*    Cardiac Enzymes: No results for input(s): CKTOTAL, CKMB, CKMBINDEX, TROPONINI in the last 168 hours.  BNP: Invalid input(s): POCBNP  CBG:  Recent Labs Lab 10/24/14 1158 10/24/14 1624  GLUCAP 181* 167*    Microbiology: Results for orders placed or performed during the hospital encounter of 10/30/2014  Blood Culture (routine x 2)     Status: None   Collection Time: 10/19/2014  7:21 PM  Result Value Ref Range Status   Specimen Description BLOOD LEFT ASSIST CONTROL  Final   Special Requests BOTTLES DRAWN AEROBIC AND ANAEROBIC 5CC  Final   Culture NO GROWTH 8 DAYS  Final   Report Status 10/29/2014 FINAL  Final  Blood Culture (routine x 2)     Status: None   Collection Time: 10/23/2014  7:21 PM  Result Value Ref Range Status   Specimen Description BLOOD LEFT ARM  Final   Special  Requests BOTTLES DRAWN AEROBIC AND ANAEROBIC 4CC  Final   Culture NO GROWTH 8 DAYS  Final   Report Status 10/29/2014 FINAL  Final  Stool culture     Status: None   Collection Time: 10/24/2014 11:36 PM  Result Value Ref Range Status   Specimen Description STOOL  Final   Special Requests Normal  Final   Culture   Final    NO SALMONELLA OR SHIGELLA ISOLATED NO CAMPYLOBACTER DETECTED No Pathogenic E. coli detected    Report Status 10/24/2014 FINAL  Final  C difficile quick scan w PCR reflex     Status: None   Collection Time: 10/11/2014 11:36 PM  Result Value Ref Range Status   C Diff antigen NEGATIVE NEGATIVE Final   C Diff toxin NEGATIVE NEGATIVE Final   C Diff interpretation Negative for C. difficile  Final  Urine culture     Status: None   Collection Time: 10/22/14  5:00 AM  Result Value Ref Range Status   Specimen  Description URINE, RANDOM  Final   Special Requests NONE  Final   Culture NO GROWTH 1 DAY  Final   Report Status 10/23/2014 FINAL  Final  Culture, expectorated sputum-assessment     Status: None (Preliminary result)   Collection Time: 10/22/14  5:00 AM  Result Value Ref Range Status   Specimen Description SPUTUM  Final   Special Requests Normal  Final   Sputum evaluation   Final    Sputum specimen not acceptable for testing.  Please recollect.   CALLED LISA SCOTT AT [phone] 10/22/14.PMH   Report Status PENDING  Incomplete  Culture, expectorated sputum-assessment     Status: None   Collection Time: 10/22/14  6:55 PM  Result Value Ref Range Status   Specimen Description SPUTUM  Final   Special Requests Normal  Final   Sputum evaluation THIS SPECIMEN IS ACCEPTABLE FOR SPUTUM CULTURE  Final   Report Status 10/22/2014 FINAL  Final  Culture, respiratory (NON-Expectorated)     Status: None (Preliminary result)   Collection Time: 10/22/14  6:55 PM  Result Value Ref Range Status   Specimen Description SPUTUM  Final   Special Requests Normal Reflexed from [id number]  Final   Gram Stain   Final    GOOD SPECIMEN - 80-90% WBCS FEW WBC SEEN MANY GRAM POSITIVE COCCI IN CLUSTERS FEW GRAM POSITIVE RODS    Culture   Final    LIGHT GROWTH ACHROMOBACTER SPECIES MOLD SENT TO STATE FOR IDENTIFICATION    Report Status PENDING  Incomplete   Organism ID, Bacteria ACHROMOBACTER SPECIES  Final      Susceptibility   Achromobacter species - MIC*    AMPICILLIN Value in next row Resistant      RESISTANT>=32    CEFTAZIDIME Value in next row Sensitive      SENSITIVE4    CEFAZOLIN Value in next row Resistant      RESISTANT>=64    CEFTRIAXONE Value in next row Resistant      RESISTANT>=64    CIPROFLOXACIN Value in next row Resistant      RESISTANT>=4    GENTAMICIN Value in next row Resistant      RESISTANT>=16    IMIPENEM Value in next row Sensitive      SENSITIVE1    TRIMETH/SULFA Value in next row  Sensitive      SENSITIVE<=20    PIP/TAZO Value in next row Sensitive      SENSITIVE<=4    * LIGHT GROWTH ACHROMOBACTER SPECIES  Coagulation Studies: No results for input(s): LABPROT, INR in the last 72 hours.  Urinalysis: No results for input(s): COLORURINE, LABSPEC, PHURINE, GLUCOSEU, HGBUR, BILIRUBINUR, KETONESUR, PROTEINUR, UROBILINOGEN, NITRITE, LEUKOCYTESUR in the last 72 hours.  Invalid input(s): APPERANCEUR    Imaging: Dg Chest 2 View  10/30/2014   CLINICAL DATA:  Sudden onset shortness of breath and cough  EXAM: CHEST  2 VIEW  COMPARISON:  October 28, 2014  FINDINGS: There is focal infiltrate in the posterior left base. The lungs elsewhere clear. Heart is mildly enlarged with pulmonary vascularity within normal limits. There is atherosclerotic change in aorta. Patient is status post coronary artery bypass grafting. There is degenerative change in the thoracic spine.  IMPRESSION: Airspace consolidation posterior right base. Lungs elsewhere clear. There is cardiomegaly. Followup PA and lateral chest radiographs recommended in 3-4 weeks following trial of antibiotic therapy to ensure resolution and exclude underlying malignancy.   Electronically Signed   By: Lowella Grip III M.D.   On: 10/30/2014 16:30   Dg Esophagus  10/29/2014   CLINICAL DATA:  81 year old male with difficulty swallowing for the past 2 months. Initial encounter.  EXAM: ESOPHOGRAM/BARIUM SWALLOW  TECHNIQUE: Single contrast examination was performed using  thin barium.  FLUOROSCOPY TIME:  Radiation Exposure Index (as provided by the fluoroscopic device): 2365.74 micro Gy cm2  COMPARISON:  None.  FINDINGS: No laryngeal penetration or aspiration.  Poor primary esophageal stripping wave.  No esophageal obstructing lesion.  Very small sliding-type hilar hernia with mild smooth narrowing just above this region.  A barium tablet was not ingested secondary to the fact that the patient was in the supine position and may  have had difficulty swallowing the pill.  IMPRESSION: No laryngeal penetration or aspiration.  Poor primary esophageal stripping wave.  Very small sliding-type hilar hernia with mild smooth narrowing just above this region.   Electronically Signed   By: Genia Del M.D.   On: 10/29/2014 13:28     Medications:     . ALPRAZolam  1 mg Oral Q2000  . amiodarone  200 mg Oral Weekly  . aspirin EC  81 mg Oral Daily  . atorvastatin  40 mg Oral q1800  . clopidogrel  75 mg Oral Daily  . docusate sodium  200 mg Oral BID  . feeding supplement (ENSURE ENLIVE)  237 mL Oral BID BM  . finasteride  5 mg Oral Daily  . heparin subcutaneous  5,000 Units Subcutaneous 3 times per day  . meropenem (MERREM) IV  500 mg Intravenous Q12H  . mometasone-formoterol  2 puff Inhalation BID  . multivitamin with minerals  1 tablet Oral Daily  . sodium chloride  250 mL Intravenous Once  . sodium chloride  3 mL Intravenous Q12H  . tamsulosin  0.4 mg Oral Daily  . timolol  1 drop Both Eyes Daily   sodium chloride, sodium chloride, acetaminophen **OR** acetaminophen, albuterol, ALPRAZolam, alteplase, alum & mag hydroxide-simeth, guaiFENesin, heparin, HYDROcodone-homatropine, lidocaine (PF), lidocaine-prilocaine, LORazepam, morphine injection, ondansetron (ZOFRAN) IV, pentafluoroprop-tetrafluoroeth, phenol-menthol, polyethylene glycol  Assessment/ Plan:  81 y.o. male with a PMHx of abdominal aortic aneurysm status post open repair 2005, status post CABG with PCI in 1950, chronic systolic heart failure ejection fraction 20-25%, hypertension, hyperlipidemia, right carotid artery disease, gout, chronic kidney disease stage III Baseline creatinine 1.5 with an EGFR 36 who presented to [facility] with fevers, chills, cough.   1. Acute renal failure/chronic kidney disease stage III baseline Cr 1.5/egfr 36: Renal ultrasound was unremarkable  for hydronephrosis. Urinalysis unremarkable. Renal failure likely  multifactorial with contributions from nausea, vomiting, diarrhea, underlying heart failure, and pneumonia. - Hold further dialysis and see if renal function has recovered - Clinically, he appears worse today - Family leaning towards comfort care. We'll continue to monitor  2. Anemia of CKD: hgb 10.9 at last check, would continue to periodically monitor CBC.  No acute indication for procrit at present.   3. Mildly complicated right renal cyst/Acquired renal cyst.Could consider following as outpt, but not a significant acute issue at the moment.  4. Other issues- NSTEMI, Acute gout, dysphagia (Dr Gustavo Lah), aspiration pneumonia   LOS: 10 SINGH,HARMEET 9/28/201610:02 AM

## 2014-10-31 NOTE — Progress Notes (Signed)
Patient very lethargic.  No complaints of pain.  Oral suctioning done throughout the day.  Palliative care consult called to Dr. Orvan Falconer but she has not seen patient yet.    Humidification put on 02 to help with patient getting "dried out".    Currently weaned to [facility] per family request.  They would prefer if patient didn't have to wear oxygen because they believe he is uncomfortable.

## 2014-10-31 NOTE — Progress Notes (Signed)
Speech Therapy Note: reviewed chart notes noting pt has had decline in status since yesterday afternoon; noted pt's c/o Nausea and agitation for which he was given meds. There is concern he has aspirated; noted GI Barium study results as well. ST will f/u w/ pt's status tomorrow and toleration of po's(least restrictive diet consistency); pt may need an objective swallow study if it is felt pt has oropharyngeal phase dysphagia as well as the Esophageal phase dysphagia.

## 2014-10-31 NOTE — Progress Notes (Signed)
[facility] - [place] at [facility]   PATIENT NAME: [name]    MR#:  [id number]  DATE OF BIRTH:  09/15/1912  SUBJECTIVE:  CHIEF COMPLAINT:   Chief Complaint  Patient presents with  . Fever  . Cough  . Diarrhea    Admitted for NSTEMI. Had ARF and now on HD. Also had dysphagia. Had urinary retention 10/24/14- so re-inserted foley. Poor UOP. Has deteriorated overnight with breathing and is drowzy. Family at bedside. Likely aspirated. CXR showed RLL infiltrate. Worsening and looks critically ill  REVIEW OF SYSTEMS:    Review of Systems  Unable to perform ROS: mental acuity    DRUG ALLERGIES:  No Known Allergies  VITALS:  Blood pressure 84/53, pulse 76, temperature 98.3 F (36.8 C), temperature source Oral, resp. rate 20, height  (1.676 m), weight 80.468 kg (177 lb 6.4 oz), SpO2 98 %.  PHYSICAL EXAMINATION:   GENERAL: 81 y.o.-year-old patient lying in the bed with no acute distress. Looks critically ill EYES: Pupils equal, round, reactive to light and accommodation. No scleral icterus. Extraocular muscles intact.  HEENT: Head atraumatic, normocephalic. Oropharynx and nasopharynx clear.  NECK: Supple, no jugular venous distention. No thyroid enlargement, no tenderness.  LUNGS: coarse breath sounds bilateral. Poor air entry CARDIOVASCULAR: S1, S2 normal. No rubs, or gallops. 3/6 systolic murmur present ABDOMEN: Soft, nontender, appears distended. Bowel sounds present. No organomegaly or mass.  EXTREMITIES: No cyanosis, or clubbing. No pedal edema noted on the left foot. Right foot had swelling around first toe- is much better now. NEUROLOGIC: Cranial nerves II through XII are intact. Muscle strength 4/5 in all extremities. Sensation intact. Gait not checked. No focal neurological deficits PSYCHIATRIC: The patient is drowzy SKIN: No obvious rash, lesion, or ulcer.   Physical Exam LABORATORY PANEL:   CBC  Recent Labs Lab  10/31/14 0424  WBC 17.9*  HGB 10.9*  HCT 34.3*  PLT 97*   ------------------------------------------------------------------------------------------------------------------  Chemistries   Recent Labs Lab 10/31/14 0424  NA 138  K 5.3*  CL 96*  CO2 28  GLUCOSE 153*  BUN 64*  CREATININE 2.48*  CALCIUM 8.4*   ------------------------------------------------------------------------------------------------------------------  Cardiac Enzymes No results for input(s): TROPONINI in the last 168 hours. ------------------------------------------------------------------------------------------------------------------  RADIOLOGY:  Dg Chest 2 View  10/30/2014   CLINICAL DATA:  Sudden onset shortness of breath and cough  EXAM: CHEST  2 VIEW  COMPARISON:  October 28, 2014  FINDINGS: There is focal infiltrate in the posterior left base. The lungs elsewhere clear. Heart is mildly enlarged with pulmonary vascularity within normal limits. There is atherosclerotic change in aorta. Patient is status post coronary artery bypass grafting. There is degenerative change in the thoracic spine.  IMPRESSION: Airspace consolidation posterior right base. Lungs elsewhere clear. There is cardiomegaly. Followup PA and lateral chest radiographs recommended in 3-4 weeks following trial of antibiotic therapy to ensure resolution and exclude underlying malignancy.   Electronically Signed   By: Bretta Bang III M.D.   On: 10/30/2014 16:30   Dg Esophagus  10/29/2014   CLINICAL DATA:  81 year old male with difficulty swallowing for the past 2 months. Initial encounter.  EXAM: ESOPHOGRAM/BARIUM SWALLOW  TECHNIQUE: Single contrast examination was performed using  thin barium.  FLUOROSCOPY TIME:  Radiation Exposure Index (as provided by the fluoroscopic device): 2365.74 micro Gy cm2  COMPARISON:  None.  FINDINGS: No laryngeal penetration or aspiration.  Poor primary esophageal stripping wave.  No esophageal obstructing  lesion.  Very small sliding-type hilar  hernia with mild smooth narrowing just above this region.  A barium tablet was not ingested secondary to the fact that the patient was in the supine position and may have had difficulty swallowing the pill.  IMPRESSION: No laryngeal penetration or aspiration.  Poor primary esophageal stripping wave.  Very small sliding-type hilar hernia with mild smooth narrowing just above this region.   Electronically Signed   By: Lacy Duverney M.D.   On: 10/29/2014 13:28    ASSESSMENT AND PLAN:   [name] is a 81 y.o. male with a known history of history of coronary reactive disease status post bypass graft surgery and PCI, hypertension, AAA repair, chronic bronchitis not on home oxygen, congestive heart failure with systolic dysfunction, last known ejection fraction of 30% presents to the hospital secondary to worsening right foot big toe pain with redness, fevers, worsening diarrhea and cough.  # Aspiration pneumonia Start Meropenem. Sputum cx has Achromobater. Acute respiratory failure due to pneumonia. Poor prognosis. Drowzy. Poor cough. Partial code. Discussed with family. Would like to discuss with more family and decide regarding keeping him partial code or make him DNR. They want abx for 1 more day. But do not want him to suffer. Changed to NPO for now.  # Non-ST segment elevation MI-patient is asymptomatic,  -Significant known cardiac history, monitor telemetry, cardiology consult appreciated  -IV heparin drip discontinued after 48 hours, recycle troponins stayed in range of 17 -Blood pressure is on the lower side, so not on beta blockers. Continue statin -Echocardiogram done= low EF. Known history of severe aortic stenosis - No aggressive work ups due to old age. - No ACE inhibitors due to renal failure.  # Acute gouty attack - resolved Stopped prednisone.  # Acute renal failure on CKD-likely prerenal and ATN -Renal ultrasound without significant  findings. -Now on HD. Hold off today.  # Subacute diarrhea-going on for almost a month. -No recent use of antibiotics. Stool cultures and stool for C. difficile ordered- negative -Gastroenterology consult appreciated-suggest to avoid milk product. - improved.  # Hypertension-hypotensive in the hospital. Not on any blood pressure medications. -Monitor at this time  # chronic systolic CHF-not fluid overloaded. On HD  # Dysphagia - odynophagia Checked barium swallow   # Urinary retention  Was unable to pass urine once foley removed on 10/24/14   Reinserted foley. He was already on Proscar   Added flomax.  All the records are reviewed and case discussed with Care Management/Social Workerr. Management plans discussed with the patient, family and they are in agreement.  CODE STATUS: Yes for CPR- No intubation or ventilator.  TOTAL CRITICAL CARE TIME TAKING CARE OF THIS PATIENT: 40 minutes.    Milagros Loll R M.D on 10/31/2014   Between 7am to 6pm - Pager - [phone]  After 6pm go to [email or web address] - password [email or web address] [facility]  [facility]  Office  [phone]  CC: Primary care physician; Brayton El, MD

## 2014-11-01 ENCOUNTER — Encounter: Payer: Self-pay | Admitting: *Deleted

## 2014-11-01 MED ORDER — OXYCODONE HCL 20 MG/ML PO CONC
5.0000 mg | ORAL | Status: DC | PRN
  Administered 2014-11-02: 5 mg via ORAL
  Filled 2014-11-01: qty 1

## 2014-11-01 MED ORDER — POLYVINYL ALCOHOL 1.4 % OP SOLN
1.0000 [drp] | Freq: Four times a day (QID) | OPHTHALMIC | Status: DC | PRN
  Filled 2014-11-01: qty 15

## 2014-11-01 MED ORDER — OXYCODONE HCL 20 MG/ML PO CONC: 5.0000 mg | ORAL | Status: DC | PRN

## 2014-11-01 MED ORDER — SODIUM CHLORIDE 0.9 % IJ SOLN: 3.0000 mL | INTRAMUSCULAR | Status: DC | PRN

## 2014-11-01 MED ORDER — LORAZEPAM 1 MG PO TABS: 1.0000 mg | ORAL_TABLET | ORAL | Status: DC | PRN

## 2014-11-01 MED ORDER — ATROPINE SULFATE 1 % OP SOLN
4.0000 [drp] | OPHTHALMIC | Status: DC | PRN
  Administered 2014-11-02: 4 [drp] via SUBLINGUAL
  Filled 2014-11-01: qty 2

## 2014-11-01 MED ORDER — LORAZEPAM 2 MG/ML IJ SOLN: 1.0000 mg | INTRAMUSCULAR | Status: DC | PRN

## 2014-11-01 MED ORDER — LORAZEPAM 2 MG/ML PO CONC: 1.0000 mg | ORAL | Status: DC | PRN

## 2014-11-01 MED ORDER — FLEET ENEMA 7-19 GM/118ML RE ENEM
1.0000 | ENEMA | Freq: Once | RECTAL | Status: AC
  Administered 2014-11-01: 1 via RECTAL

## 2014-11-01 MED ORDER — FLEET ENEMA 7-19 GM/118ML RE ENEM: 1.0000 | ENEMA | Freq: Every day | RECTAL | Status: DC | PRN

## 2014-11-01 MED ORDER — SODIUM CHLORIDE 0.9 % IJ SOLN
3.0000 mL | Freq: Two times a day (BID) | INTRAMUSCULAR | Status: DC
  Administered 2014-11-01: 3 mL via INTRAVENOUS

## 2014-11-01 MED ORDER — SODIUM CHLORIDE 0.9 % IV SOLN: 250.0000 mL | INTRAVENOUS | Status: DC | PRN

## 2014-11-01 NOTE — Progress Notes (Signed)
Nutrition Brief Note  Chart reviewed. Pt now transitioning to comfort care. Pt on Regular Diet.  No further nutrition interventions warranted at this time.  Please re-consult as needed.   Romelle Starcher MS, RD, LDN [phone] Pager

## 2014-11-01 NOTE — Progress Notes (Signed)
Due to decline in patient's condition and being placed on comfort measures, will cancel his initial appointment at [facility] on November 08, 2014.

## 2014-11-01 NOTE — Care Management (Signed)
Palliative care consult still pending.  There is an incomplete consult note by palliative care but the assessment and plan information is pending

## 2014-11-01 NOTE — Progress Notes (Signed)
New referral for [facility] services after discharge received from [facility]. [name] is a 81 year old man brought to the [facility] ED for evaluation of diarrhea, fever and right toe pain and cough. PMH includes CAD,CABG, AAA w/repain, Gout, HTN and CKD.  In the ED he was found to have elevated troponin and acute on chronic renal failure. Temperature in the ED 101. He was started on IV heparin. Echocardiogram results show EF of 20-25%. EKG -left bundle branch block. He has received IV antibiotics for treatment of sepsis as well as inpatient dialysis. Despite these interventions he has continued to decline. Patient and family would like for him to return to his home with hospice services. Writer met with patient's daughter [name]. Education initiated regarding hospice services, philosophy and team approach to care with good understating voiced by [name]. DME needs include and fully electric hospital bed and oxygen. Patient is currently using 1 liter, family would like DME to be delivered tomorrow morning after room has been prepared. Hospice information and contact numbers left with [facility]. Patient information faxed to referral intake. Plan is for discharge tomorrow via EMS with portable DNR in place after DME has been delivered. CMRN Nann and Attending physician Dr. Darvin Neighbours made aware. Thank you for the opportunity to serve [name] and his family.  Flo Shanks RN, BSN, [place] [facility] [place], [facility] [phone] c

## 2014-11-01 NOTE — Care Management (Signed)
Patient is now comfort care and for discharge home 9/30 under the care of [facility].  Hospital bed to be delivered to the home 9/30.  Patient will travel by

## 2014-11-01 NOTE — Plan of Care (Signed)
Problem: Phase II Progression Outcomes Goal: Other Phase II Outcomes/Goals Outcome: Progressing Pt is alert and oriented x 4, family at bedside, c/o hip pain improved with morphine, enema given which produced bm, bedbound, foley in place, temp dialysis cath removed, pt is likely to d/c to [facility] with hospice on 9/30. Comfort measures addressed. Uneventful shift.

## 2014-11-01 NOTE — Progress Notes (Signed)
[facility] Physicians - [place] at [facility]   PATIENT NAME: [name]    MR#:  [id number]  DATE OF BIRTH:  11/26/1912  SUBJECTIVE:  CHIEF COMPLAINT:   Chief Complaint  Patient presents with  . Fever  . Cough  . Diarrhea    Admitted for NSTEMI. Had ARF and now on HD. Also had dysphagia. Had urinary retention 10/24/14- so re-inserted foley. Poor UOP. Now drowzy. Family at bedside. They say patient has said he is tired and was asking if he is worsening. They feel "he is ready to go"  REVIEW OF SYSTEMS:    Review of Systems  Unable to perform ROS: mental acuity    DRUG ALLERGIES:  No Known Allergies  VITALS:  Blood pressure 104/68, pulse 81, temperature 98 F (36.7 C), temperature source Oral, resp. rate 22, height  (1.676 m), weight 80.468 kg (177 lb 6.4 oz), SpO2 96 %.  PHYSICAL EXAMINATION:   Critically ill appearing. Drowzy. Coarse breath sounds Foley No edema  Physical Exam LABORATORY PANEL:   CBC  Recent Labs Lab 10/31/14 0424  WBC 17.9*  HGB 10.9*  HCT 34.3*  PLT 97*   ------------------------------------------------------------------------------------------------------------------  Chemistries   Recent Labs Lab 10/31/14 0424  NA 138  K 5.3*  CL 96*  CO2 28  GLUCOSE 153*  BUN 64*  CREATININE 2.48*  CALCIUM 8.4*   ------------------------------------------------------------------------------------------------------------------  Cardiac Enzymes No results for input(s): TROPONINI in the last 168 hours. ------------------------------------------------------------------------------------------------------------------  RADIOLOGY:  Dg Chest 2 View  10/30/2014   CLINICAL DATA:  Sudden onset shortness of breath and cough  EXAM: CHEST  2 VIEW  COMPARISON:  October 28, 2014  FINDINGS: There is focal infiltrate in the posterior left base. The lungs elsewhere clear. Heart is mildly enlarged with pulmonary vascularity  within normal limits. There is atherosclerotic change in aorta. Patient is status post coronary artery bypass grafting. There is degenerative change in the thoracic spine.  IMPRESSION: Airspace consolidation posterior right base. Lungs elsewhere clear. There is cardiomegaly. Followup PA and lateral chest radiographs recommended in 3-4 weeks following trial of antibiotic therapy to ensure resolution and exclude underlying malignancy.   Electronically Signed   By: Bretta Bang III M.D.   On: 10/30/2014 16:30    ASSESSMENT AND PLAN:   [name] is a 81 y.o. male with a known history of history of coronary reactive disease status post bypass graft surgery and PCI, hypertension, AAA repair, chronic bronchitis not on home oxygen, congestive heart failure with systolic dysfunction, last known ejection fraction of 30% presents to the hospital secondary to worsening right foot big toe pain with redness, fevers, worsening diarrhea and cough.  # Aspiration pneumonia # Non-ST segment elevation MI # Acute gouty attack - resolved # Acute renal failure on CKD-likely prerenal and ATN # Constipation # Hypertension # chronic systolic CHF # Dysphagia - odynophagia # Urinary retention   Discussed with Daughters at bedside. They feel patient is worsening and is ready to go. Discussed and will place on comfort measures. Diet as tolerated and family understands risk of aspiration but this will be pleasure feeds. Continue foley. D/C HD catheter. Enema once. DNR  Hospice screen  All the records are reviewed and case discussed with Care Management/Social Workerr. Management plans discussed with the patient, family and they are in agreement.  CODE STATUS: DNR  TOTAL TIME TAKING CARE OF THIS PATIENT: 40 minutes. >50% time in co-ordinating care   Milagros Loll R M.D on 11/01/2014  Between 7am to 6pm - Pager - [phone]  After 6pm go to [email or web address] - password [email or web address] [facility]  [place]  Hospitalists  Office  [phone]  CC: Primary care physician; Brayton El, MD

## 2014-11-01 NOTE — Progress Notes (Signed)
Palliative Medicine Inpatient Consult Follow Up Note   Name: [name] Date: 11/01/2014 MRN: [id number]  DOB: [birth year]  Referring Physician: Milagros Loll, MD  Palliative Care consult requested for this 81 y.o. male for goals of medical therapy in patient with end stage illnesses.  IMPRESSION: 1. Aspiration Pneumonia 2. Non STEMI  3. Acute gouty attack --resolved 4. Acute renal failure on CKD due to ATN and prerenal causes 5. Diarrhea (for a month) C Diff neg 6. Chronic Systolic CHF w/o fluid overload --on HD 7. Dysphagia and odynophagia 8. Urinary Retention 9. Anxiety/ restlessness 10. Inadequate oral intake and moderate malnutrition  TODAY'S DISCUSSIONS, DECISIONS, AND PLANS: 1.  Pt is now a full DNR status.  I have completed a Portable DNR form for the paper chart.  2.  I discussed Hospice with pt's daughter, Consuela Mimes. She is the decision maker though there are 3 sons of the patient (one is in a facility, one lives at a distance in [place] and is not involved in making decisions, and another [name]) has deferred decisions to [place].  She felt we should ask the pt what he would want and he stated he wants to be in his own home with Hospice instead of at [facility].  3. Choice was presented to the family by the Care Mgr listing various Hospices.  Family selected [facility].  4. The Hospice Liaison was then asked to speak with family about planning for pt to go home with Hospice care in his home tomorrow.  They need to line up a hospital bed in the interim.    5.  I will adjust some of his orders to be comfort oriented and tomorrow, will adjust DC meds if needed.    6.  Dr Elpidio Anis, care mgmt, nursing, all updated.    7.  Pt will need enemas until results due to discomfort from constipation (already underway).    8.  Bereavement tray is requested as pts' sister hasn't been eating for two days or more.    REVIEW OF SYSTEMS:  Patient is  not able to provide ROS  --too weak.  CODE STATUS: DNR  --Dr sudini obtained a DNR that is a full DNR this am   PAST MEDICAL HISTORY: Past Medical History  Diagnosis Date  . AAA (abdominal aortic aneurysm) 2005    Open AAA repair  . S/P CABG (coronary artery bypass graft) 2003    At [facility]; PCI 2004; no LHC since 2004  . Echocardiogram abnormal 11/10    Mild LVH, EF 55%, mild AI, diastolic dysfunction  . Hypertension     Unspecified  . Hyperlipidemia     Mixed  . History of orthostatic hypotension   . Carotid artery disease     Right basal ganglia CVA 11/10; CTA neck showed 90% RICA stenosis, 75-80% LICA stenosis  . Coronary artery disease     a. s/p CABG 1995 b. PCI in 2004  . Hx of bronchitis   . Pneumonia     HISTORY  . Gout   . History of AAA (abdominal aortic aneurysm) repair 07/09/2014    2008   . CKD (chronic kidney disease)     baseline creatinine of 1.8    PAST SURGICAL HISTORY:  Past Surgical History  Procedure Laterality Date  . Coronary artery bypass graft  2003  . Abdominal aortic aneurysm repair  2005  . Peripheral vascular catheterization Right 10/24/2014    Procedure: Dialysis/Perma Catheter Repair;  Surgeon:  Annice Needy, MD;  Location: [facility] INVASIVE CV LAB;  Service: Cardiovascular;  Laterality: Right;    Vital Signs: BP 104/68 mmHg  Pulse 81  Temp(Src) 98 F (36.7 C) (Oral)  Resp 22  Ht  (1.676 m)  Wt 80.468 kg (177 lb 6.4 oz)  BMI 28.65 kg/m2  SpO2 96% Filed Weights   10/29/14 1200 10/29/14 1558 10/29/14 1630  Weight: 82.6 kg (182 lb 1.6 oz) 81.4 kg (179 lb 7.3 oz) 80.468 kg (177 lb 6.4 oz)    Estimated body mass index is 28.65 kg/(m^2) as calculated from the following:   Height as of this encounter:  (1.676 m).   Weight as of this encounter: 80.468 kg (177 lb 6.4 oz).  PHYSICAL EXAM: Lying in bed  Appearing weak Wakens when stimulated and can answer questions --note that pt stated he wants to have Hospice in his own home instead  of going to Hospice Home Neck wo JVD or Tm Hrt rrr no m Lungs cta anteriorly with decreased BS in bases Abd soft and NT Skin is pale and dry no mottling  LABS: CBC:    Component Value Date/Time   WBC 17.9* 10/31/2014 0424   WBC 13.2* 05/20/2014 0417   HGB 10.9* 10/31/2014 0424   HGB 11.6* 05/20/2014 0417   HCT 34.3* 10/31/2014 0424   HCT 34.8* 05/20/2014 0417   PLT 97* 10/31/2014 0424   PLT 114* 05/20/2014 0417   MCV 96.0 10/31/2014 0424   MCV 94 05/20/2014 0417   NEUTROABS 15.3* 10/31/2014 0424   NEUTROABS 9.8* 05/20/2014 0417   LYMPHSABS 1.4 10/31/2014 0424   LYMPHSABS 2.4 05/20/2014 0417   MONOABS 1.0 10/31/2014 0424   MONOABS 0.9 05/20/2014 0417   EOSABS 0.0 10/31/2014 0424   EOSABS 0.1 05/20/2014 0417   BASOSABS 0.3* 10/31/2014 0424   BASOSABS 0.0 05/20/2014 0417   Comprehensive Metabolic Panel:    Component Value Date/Time   NA 138 10/31/2014 0424   NA 133* 05/20/2014 0417   NA 142 04/06/2013 0935   K 5.3* 10/31/2014 0424   K 4.2 05/20/2014 0417   CL 96* 10/31/2014 0424   CL 101 05/20/2014 0417   CO2 28 10/31/2014 0424   CO2 27 05/20/2014 0417   BUN 64* 10/31/2014 0424   BUN 39* 05/20/2014 0417   BUN 51* 04/06/2013 0935   CREATININE 2.48* 10/31/2014 0424   CREATININE 1.56* 05/20/2014 0417   GLUCOSE 153* 10/31/2014 0424   GLUCOSE 125* 05/20/2014 0417   GLUCOSE 67 04/06/2013 0935   CALCIUM 8.4* 10/31/2014 0424   CALCIUM 8.4* 05/20/2014 0417   AST 81* 10/18/2014 1921   AST 32 10/16/2012 1801   ALT 14* 10/17/2014 1921   ALT 18 10/16/2012 1801   ALKPHOS 84 10/30/2014 1921   ALKPHOS 87 10/16/2012 1801   BILITOT 1.3* 11/01/2014 1921   BILITOT 0.8 10/16/2012 1801   PROT 7.5 10/31/2014 1921   PROT 8.1 10/16/2012 1801   PROT 6.9 03/10/2012 1511   ALBUMIN 3.2* 10/27/2014 1010   ALBUMIN 3.8 10/16/2012 1801    REFERRALS TO BE ORDERED:  Hospice in the Home   More than 50% of the visit was spent in counseling/coordination of care: YES  Time Spent:  60  min

## 2014-11-02 ENCOUNTER — Telehealth: Payer: Self-pay | Admitting: Family Medicine

## 2014-11-02 MED ORDER — BISACODYL 10 MG RE SUPP: 10.0000 mg | Freq: Every day | RECTAL | Status: DC | PRN

## 2014-11-02 MED ORDER — BISACODYL 10 MG RE SUPP: 10.0000 mg | RECTAL | Status: AC | PRN

## 2014-11-02 MED ORDER — SENNA 8.6 MG PO TABS: 2.0000 | ORAL_TABLET | Freq: Every day | ORAL | Status: AC | PRN

## 2014-11-02 MED ORDER — SCOPOLAMINE 1 MG/3DAYS TD PT72: 1.0000 | MEDICATED_PATCH | TRANSDERMAL | Status: AC

## 2014-11-02 MED ORDER — MORPHINE SULFATE (CONCENTRATE) 10 MG/0.5ML PO SOLN
5.0000 mg | ORAL | Status: DC
  Administered 2014-11-02: 5 mg via ORAL
  Filled 2014-11-02: qty 1

## 2014-11-02 MED ORDER — FLEET ENEMA 7-19 GM/118ML RE ENEM: 1.0000 | ENEMA | Freq: Every day | RECTAL | Status: AC | PRN

## 2014-11-02 MED ORDER — PROCHLORPERAZINE MALEATE 10 MG PO TABS: 10.0000 mg | ORAL_TABLET | Freq: Four times a day (QID) | ORAL | Status: AC | PRN

## 2014-11-02 MED ORDER — MORPHINE SULFATE (CONCENTRATE) 10 MG /0.5 ML PO SOLN: 5.0000 mg | ORAL | Status: AC | PRN

## 2014-11-02 MED ORDER — ACETAMINOPHEN 325 MG PO TABS: 650.0000 mg | ORAL_TABLET | Freq: Four times a day (QID) | ORAL | Status: AC | PRN

## 2014-11-02 MED ORDER — POLYVINYL ALCOHOL 1.4 % OP SOLN: 1.0000 [drp] | Freq: Four times a day (QID) | OPHTHALMIC | Status: AC | PRN

## 2014-11-02 MED ORDER — MORPHINE SULFATE (PF) 2 MG/ML IV SOLN: 1.0000 mg | INTRAVENOUS | Status: DC | PRN

## 2014-11-02 MED ORDER — GLYCOPYRROLATE 0.2 MG/ML IJ SOLN
0.4000 mg | Freq: Four times a day (QID) | INTRAMUSCULAR | Status: DC
  Filled 2014-11-02 (×3): qty 2

## 2014-11-02 MED ORDER — LORAZEPAM 1 MG PO TABS: 0.5000 mg | ORAL_TABLET | ORAL | Status: AC | PRN

## 2014-11-03 ENCOUNTER — Other Ambulatory Visit: Payer: Self-pay | Admitting: Family Medicine

## 2014-11-03 NOTE — Progress Notes (Signed)
Pt comfort care throughout shift, on room air, resting quietly, roxanol given for SOB, son and daughter at bedside, pt expired at 13:27, Fernanda Drum and nursing supervisor notified, not a candidate for donor services. Uneventful shift.

## 2014-11-03 NOTE — Progress Notes (Signed)
Follow up visit made on new referral for hospice services at home. Patient seen lying in bed, daughter Kathie Rhodes at bedside. He appears to be actively dying. Audible secretions noted, skin pale, feet and knees mottled. Apnea of 45 seconds. Kathie Rhodes had noted changes. Writer offered to still have patient transfer home, Kathie Rhodes at this time feels that it would be better for patient not to be moved. She called family to alert them of changes. Staff RN Merry Proud notified of need for secretion management, atropine given as ordered. Dr. Orvan Falconer and Dr. Elpidio Anis, [facility] Nann and staff RN Merry Proud all made aware that patient will not transfer from the hospital. Emotional support given, chaplain offered.Kathie Rhodes stated that their pastor was on the way. Triage and referral notified that patient will not be going home,. DME delivery cancelled.  Dayna Barker RN, BSN, [facility] [place], [facility] liaison [phone] c

## 2014-11-03 NOTE — Progress Notes (Signed)
[facility] Physicians - [place] at [facility]   PATIENT NAME: [name]    MR#:  [id number]  DATE OF BIRTH:  1912-03-12  SUBJECTIVE:  CHIEF COMPLAINT:   Chief Complaint  Patient presents with  . Fever  . Cough  . Diarrhea    Patient made comfort care 11/01/2014. Family at bedside. Pt non verbal. Periods of apnea.  REVIEW OF SYSTEMS:    Review of Systems  Unable to perform ROS: mental acuity    DRUG ALLERGIES:  No Known Allergies  VITALS:  Blood pressure 101/73, pulse 79, temperature 98 F (36.7 C), temperature source Oral, resp. rate 20, height  (1.676 m), weight 80.468 kg (177 lb 6.4 oz), SpO2 98 %.  PHYSICAL EXAMINATION:   Critically ill appearing. Drowzy. Coarse breath sounds Foley No edema  Physical Exam LABORATORY PANEL:   CBC  Recent Labs Lab 10/31/14 0424  WBC 17.9*  HGB 10.9*  HCT 34.3*  PLT 97*   ------------------------------------------------------------------------------------------------------------------  Chemistries   Recent Labs Lab 10/31/14 0424  NA 138  K 5.3*  CL 96*  CO2 28  GLUCOSE 153*  BUN 64*  CREATININE 2.48*  CALCIUM 8.4*   ------------------------------------------------------------------------------------------------------------------  Cardiac Enzymes No results for input(s): TROPONINI in the last 168 hours. ------------------------------------------------------------------------------------------------------------------  RADIOLOGY:  No results found.  ASSESSMENT AND PLAN:   [name] is a 81 y.o. male with a known history of history of coronary reactive disease status post bypass graft surgery and PCI, hypertension, AAA repair, chronic bronchitis not on home oxygen, congestive heart failure with systolic dysfunction, last known ejection fraction of 30% presents to the hospital secondary to worsening right foot big toe pain with redness, fevers, worsening diarrhea and cough.  #  Aspiration pneumonia # Non-ST segment elevation MI # Acute gouty attack - resolved # Acute renal failure on CKD-likely prerenal and ATN # Constipation # Hypertension # chronic systolic CHF # Dysphagia - odynophagia # Urinary retention   On comfort measures. Patient will likely not survive the day. Family aware and are  Ready.   CODE STATUS: DNR  TOTAL TIME TAKING CARE OF THIS PATIENT: 25 minutes. >50% time spent discussing with family .   Milagros Loll R M.D on 10/23/2014   Between 7am to 6pm - Pager - [phone]  After 6pm go to [email or web address] - password [email or web address] [facility]  [place] Hospitalists  Office  [phone]  CC: Primary care physician; Brayton El, MD

## 2014-11-03 NOTE — Progress Notes (Signed)
Patient is alert and oriented this shift. Noted some rattling which the son is concerned. RN educated the son is part of the process. Medicated with Morphine and Oxycodone for pain and dyspnea with relief. Son at bedside the whole of this shift. Will continue to monitor.

## 2014-11-03 NOTE — Progress Notes (Signed)
Notified Dr. Elpidio Anis, Dr. Orvan Falconer, and nursing supervisor Annice Pih that patient expired 13:27. Family at bedside.

## 2014-11-03 NOTE — Progress Notes (Signed)
Patient expired 10/31/2014

## 2014-11-03 NOTE — Progress Notes (Signed)
Palliative Medicine Inpatient Consult Follow Up Note   Name: [name] Date: 10/11/2014 MRN: [id number]  DOB: Dec 03, 1912  Referring Physician: Milagros Loll, MD  Palliative Care consult requested for this 81 y.o. male for goals of medical therapy in patient with multiple end stage conditions.  IMPRESSION: 1. Aspiration Pneumonia 2. Non STEMI  3. Acute gouty attack --resolved 4. Acute renal failure on CKD due to ATN and prerenal causes 5. Diarrhea (for a month) C Diff neg 6. Chronic Systolic CHF w/o fluid overload --on HD 7. Dysphagia and odynophagia 8. Urinary Retention 9. Anxiety/ restlessness 10. Inadequate oral intake   PLAN: Pt is actively dying.  I am afraid pt would die getting on a stretcher or in an ambulance. Transfer to [facility] is cancelled.  Medications are adjusted after talking with nursing staff and assessing pts needs.  Secretions need management and atropine was given but one dose did not help enough.  See orders. Will update Dr. Bernie Covey. Care Mgmt et al, aware.  Hospice Liaison nurse has talked at some length with family as have I.       REVIEW OF SYSTEMS:  Patient is not able to provide ROS due to being obtunded and dying  CODE STATUS: DNR   PAST MEDICAL HISTORY: Past Medical History  Diagnosis Date  . AAA (abdominal aortic aneurysm) 2005    Open AAA repair  . S/P CABG (coronary artery bypass graft) 2003    At [facility]; PCI 2004; no LHC since 2004  . Echocardiogram abnormal 11/10    Mild LVH, EF 55%, mild AI, diastolic dysfunction  . Hypertension     Unspecified  . Hyperlipidemia     Mixed  . History of orthostatic hypotension   . Carotid artery disease     Right basal ganglia CVA 11/10; CTA neck showed 90% RICA stenosis, 75-80% LICA stenosis  . Coronary artery disease     a. s/p CABG 1995 b. PCI in 2004  . Hx of bronchitis   . Pneumonia     HISTORY  . Gout   . History of AAA (abdominal aortic aneurysm) repair 07/09/2014    2008    . CKD (chronic kidney disease)     baseline creatinine of 1.8    PAST SURGICAL HISTORY:  Past Surgical History  Procedure Laterality Date  . Coronary artery bypass graft  2003  . Abdominal aortic aneurysm repair  2005  . Peripheral vascular catheterization Right Nov 07, 2014    Procedure: Dialysis/Perma Catheter Repair;  Surgeon: Annice Needy, MD;  Location: [facility] INVASIVE CV LAB;  Service: Cardiovascular;  Laterality: Right;    Vital Signs: BP 101/73 mmHg  Pulse 79  Temp(Src) 98 F (36.7 C) (Oral)  Resp 20  Ht  (1.676 m)  Wt 80.468 kg (177 lb 6.4 oz)  BMI 28.65 kg/m2  SpO2 98% Filed Weights   10/29/14 1200 10/29/14 1558 10/29/14 1630  Weight: 82.6 kg (182 lb 1.6 oz) 81.4 kg (179 lb 7.3 oz) 80.468 kg (177 lb 6.4 oz)    Estimated body mass index is 28.65 kg/(m^2) as calculated from the following:   Height as of this encounter:  (1.676 m).   Weight as of this encounter: 80.468 kg (177 lb 6.4 oz).  PHYSICAL EXAM: Pt is having periods of apnea (5 - 15 seconds) He has excessive secretions  He is obtunded He appears to be actively dying Eyes closed OP wet and gurgling Neck w/o JVD or TM Heart rrr no mgr --  distant HS Skin w/o mottling but he has very cool extremeties  LABS: CBC:    Component Value Date/Time   WBC 17.9* 10/31/2014 0424   WBC 13.2* 05/20/2014 0417   HGB 10.9* 10/31/2014 0424   HGB 11.6* 05/20/2014 0417   HCT 34.3* 10/31/2014 0424   HCT 34.8* 05/20/2014 0417   PLT 97* 10/31/2014 0424   PLT 114* 05/20/2014 0417   MCV 96.0 10/31/2014 0424   MCV 94 05/20/2014 0417   NEUTROABS 15.3* 10/31/2014 0424   NEUTROABS 9.8* 05/20/2014 0417   LYMPHSABS 1.4 10/31/2014 0424   LYMPHSABS 2.4 05/20/2014 0417   MONOABS 1.0 10/31/2014 0424   MONOABS 0.9 05/20/2014 0417   EOSABS 0.0 10/31/2014 0424   EOSABS 0.1 05/20/2014 0417   BASOSABS 0.3* 10/31/2014 0424   BASOSABS 0.0 05/20/2014 0417   Comprehensive Metabolic Panel:    Component Value Date/Time   NA  138 10/31/2014 0424   NA 133* 05/20/2014 0417   NA 142 04/06/2013 0935   K 5.3* 10/31/2014 0424   K 4.2 05/20/2014 0417   CL 96* 10/31/2014 0424   CL 101 05/20/2014 0417   CO2 28 10/31/2014 0424   CO2 27 05/20/2014 0417   BUN 64* 10/31/2014 0424   BUN 39* 05/20/2014 0417   BUN 51* 04/06/2013 0935   CREATININE 2.48* 10/31/2014 0424   CREATININE 1.56* 05/20/2014 0417   GLUCOSE 153* 10/31/2014 0424   GLUCOSE 125* 05/20/2014 0417   GLUCOSE 67 04/06/2013 0935   CALCIUM 8.4* 10/31/2014 0424   CALCIUM 8.4* 05/20/2014 0417   AST 81* 10/09/2014 1921   AST 32 10/16/2012 1801   ALT 14* 10/28/2014 1921   ALT 18 10/16/2012 1801   ALKPHOS 84 10/26/2014 1921   ALKPHOS 87 10/16/2012 1801   BILITOT 1.3* 11/01/2014 1921   BILITOT 0.8 10/16/2012 1801   PROT 7.5 10/28/2014 1921   PROT 8.1 10/16/2012 1801   PROT 6.9 03/10/2012 1511   ALBUMIN 3.2* 10/27/2014 1010   ALBUMIN 3.8 10/16/2012 1801      More than 50% of the visit was spent in counseling/coordination of care: YES  Time Spent:  65 min

## 2014-11-03 NOTE — Care Management Important Message (Signed)
Important Message  Patient Details  Name: [name] MRN: [id number] Date of Birth: 1912-08-20   Medicare Important Message Given:   (Patient expired November 22, 2014)    Eber Hong, RN 11/22/2014, 2:00 PM

## 2014-11-03 NOTE — Care Management (Signed)
Patient was to have discharged home today with home hospice but informed that patient is now actively dying and would not survive a transport home

## 2014-11-03 NOTE — Discharge Summary (Signed)
[facility] Physicians - [place] at [facility]    DEATH NOTE  PATIENT NAME: [name]    MR#:  [id number]  DATE OF BIRTH:  Aug 26, 1912  DATE OF ADMISSION:  11/01/2014  PRIMARY CARE PHYSICIAN: Brayton El, MD   Pronounced dead  on 2014/11/30  at 13:27           CAUSE OF DEATH - # Aspiration pneumonia # Non-ST segment elevation MI  Other  Diagnosis during hospital stay # Acute renal failure on CKD-likely prerenal and ATN # Constipation # Hypertension # chronic systolic CHF # Dysphagia - odynophagia # Urinary retention # Acute gouty attack - resolved  ADMISSION DIAGNOSIS:  Cough [R05] Diarrhea [R19.7] Elevated troponin [R79.89] Fever, unspecified fever cause [R50.9] Acute renal failure, unspecified acute renal failure type [N17.9]  HISTORY OF PRESENT ILLNESS ON ADMISSION   [name] is a 81 y.o. male with a known history of history of coronary reactive disease status post bypass graft surgery and PCI, hypertension, AAA repair, chronic bronchitis not on home oxygen, congestive heart failure with systolic dysfunction, last known ejection fraction of 30% presents to the hospital secondary to worsening right foot big toe pain with redness, fevers, worsening diarrhea and cough. Patient denies any chest pain at this time. He says his diarrhea has been going on for almost a month now and hasn't gotten any medical attention for that. His chills and fever started just this morning. Abdomen is very bloated for the last couple of days. No blood noted in the stools. No recent travel or exposure to sick contacts. He has been seeing Dr. Mayo Ao for his chronic bronchitis. His symptoms have been worsened in the last couple of days. He has cough, sputum production and chills. Denies any dysuria or frequency of urination. Extremely weak since last night. Usually stays at home by himself and has a cane and walker for ambulation. Couldn't get out of bed yesterday, so his son stayed  with him. All day today, he was having chills worsening cough and so presented to the ER. His noted to have a troponin of 17 here. Also acute on chronic renal failure. Temperature of 10 21F.  HOSPITAL COURSE:   patient was admitted onto telemetry floor.  Please refer to detailed progress notes during the hospital stay.  He was treated aggressively with IV heparin along with aspirin and statin for his and NSTEMI. Considering his advanced age and wishes not to go through any aggressive procedures he did not get cardiac catheterization.  with worsening acute renal failure patient was started on hemodialysis and was followed by nephrology.  Treated with IV antibiotics for pneumonia.  For his dysphagia and odynophagia he had a  Barium swallow.  Due to his trouble swallowing patient did have aspiration which caused aspiration pneumonia. Patient deteriorated in spite of aggressive IV antibiotics and on further discussing with family he was placed on comfort measures for end-of-life care. Plan was to discharge patient home with hospice services on comfort measures as per his wishes. But on Nov 30, 2014 at 1:27 PM patient will had to be pronounced dead.   Orie Fisherman M.D on 30-Nov-2014 at 3:02 PM  [facility] Physicians - [place] at [facility]    OFFICE [phone]

## 2014-11-03 NOTE — Telephone Encounter (Signed)
Denise from hospice faxed a form on yesterday 11-01-14 which is needing to be signed dated and faxed back. Patient is being discharged from the hospital today and in order for them to send a nurse out they have to have the order. (f) [phone] (p) [phone]

## 2014-11-03 NOTE — Telephone Encounter (Signed)
Spoke with Angelique Blonder from Hospice and she stated to disregard the form due to patient is actively dying and will not be discharged to go home.

## 2014-11-03 DEATH — deceased

## 2014-11-08 ENCOUNTER — Ambulatory Visit: Payer: Medicare Other | Admitting: Family

## 2014-12-12 LAB — CULTURE, RESPIRATORY

## 2014-12-12 LAB — CULTURE, RESPIRATORY W GRAM STAIN: Special Requests: NORMAL

## 2014-12-25 LAB — EXPECTORATED SPUTUM ASSESSMENT W REFEX TO RESP CULTURE

## 2014-12-25 LAB — EXPECTORATED SPUTUM ASSESSMENT W GRAM STAIN, RFLX TO RESP C: Special Requests: NORMAL

## 2015-01-10 ENCOUNTER — Ambulatory Visit: Payer: Medicare Other | Admitting: Family Medicine

## 2015-10-10 IMAGING — US US RENAL
1 series · 14 of 25 positions shown · non-contrast
Comparison: CT abdomen 12/27/2003

CLINICAL DATA: Acute renal failure

EXAM:
RENAL / URINARY TRACT ULTRASOUND COMPLETE

[Series 1: us renal · 0.24mm/px · 14 of 56 slices shown]
[im 1/56]
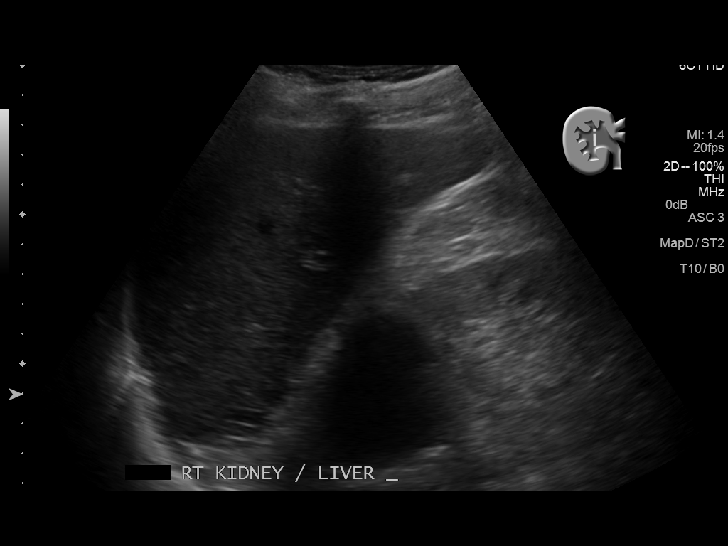
[im 5/56]
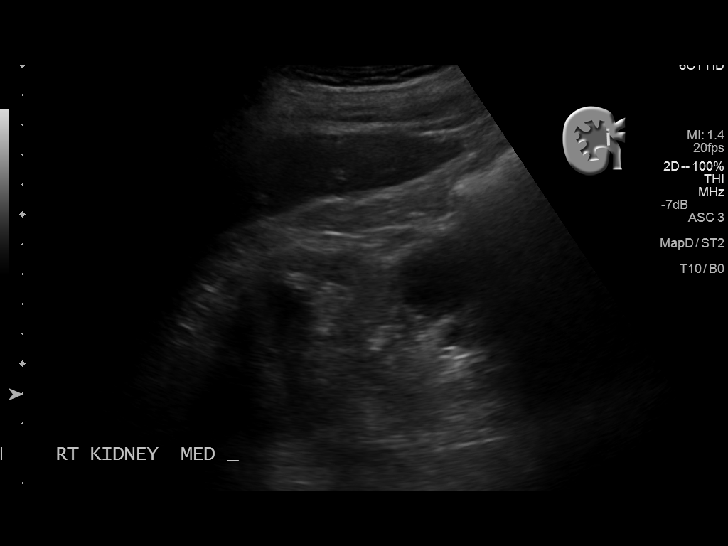
[im 10/56]
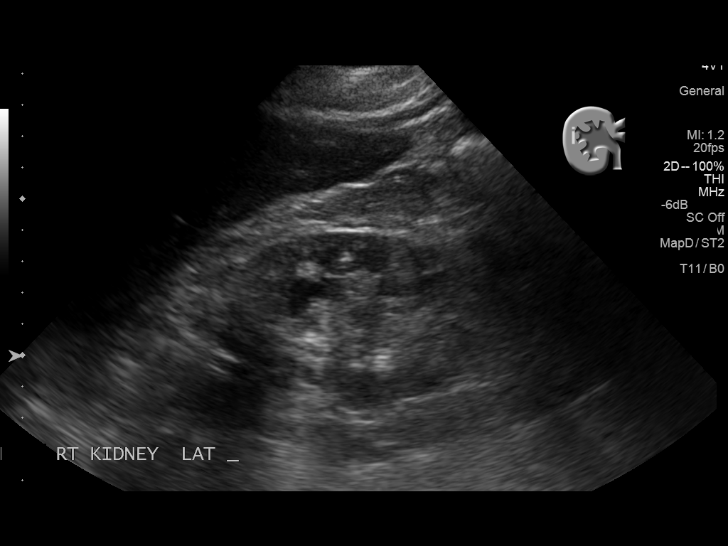
[im 14/56]
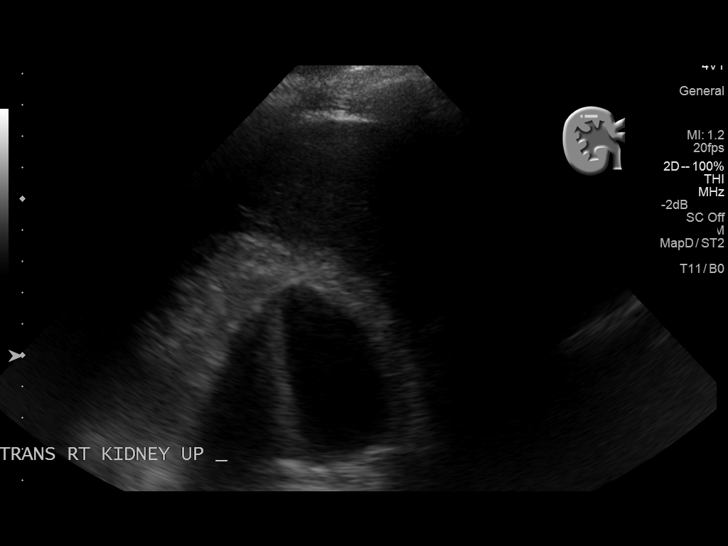
[im 19/56]
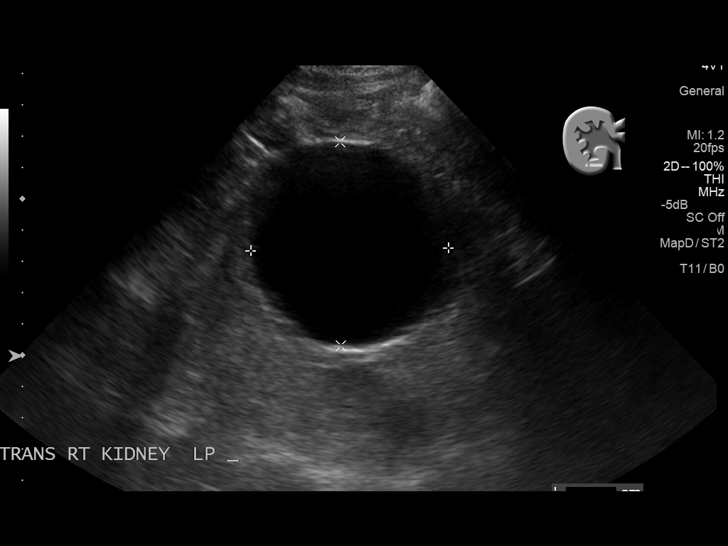
[im 21/56]
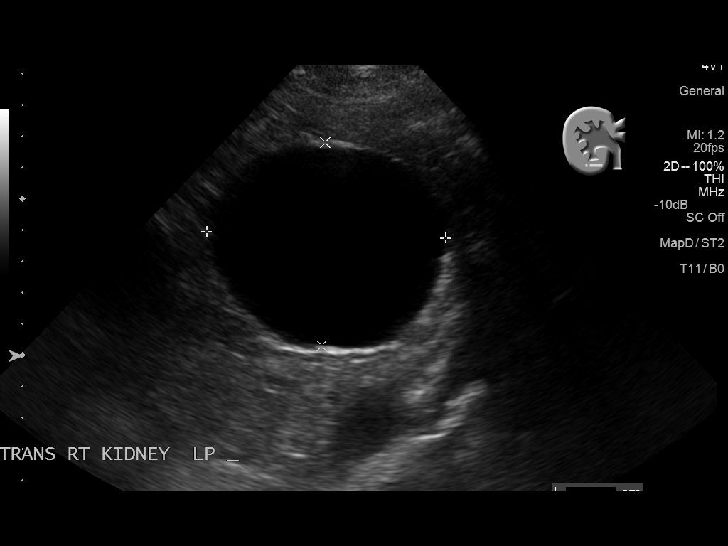
[im 26/56]
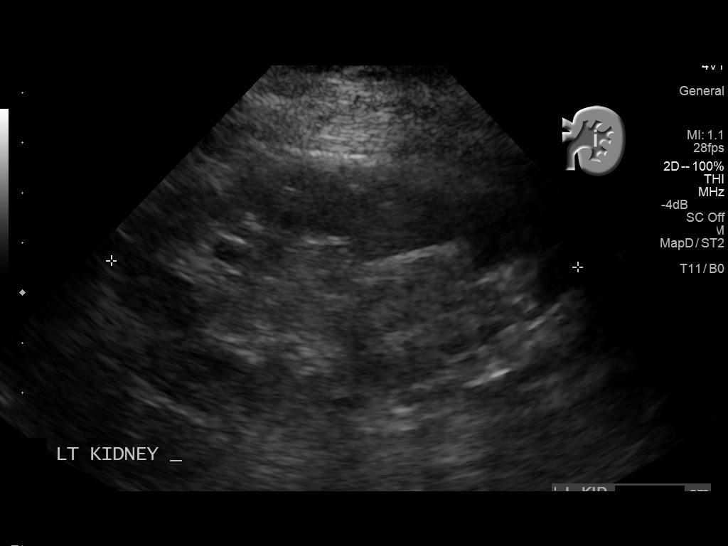
[im 30/56]
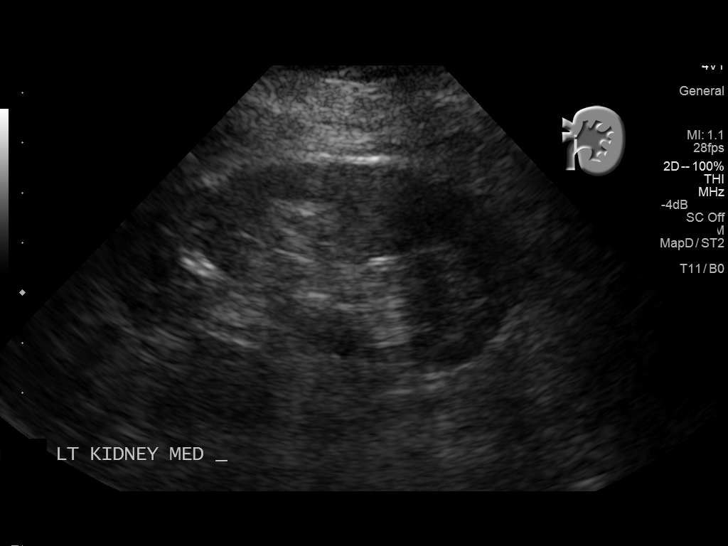
[im 35/56]
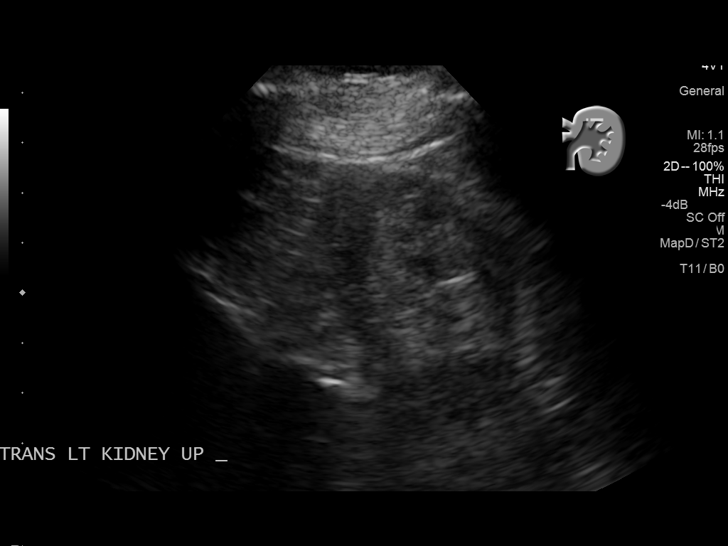
[im 37/56]
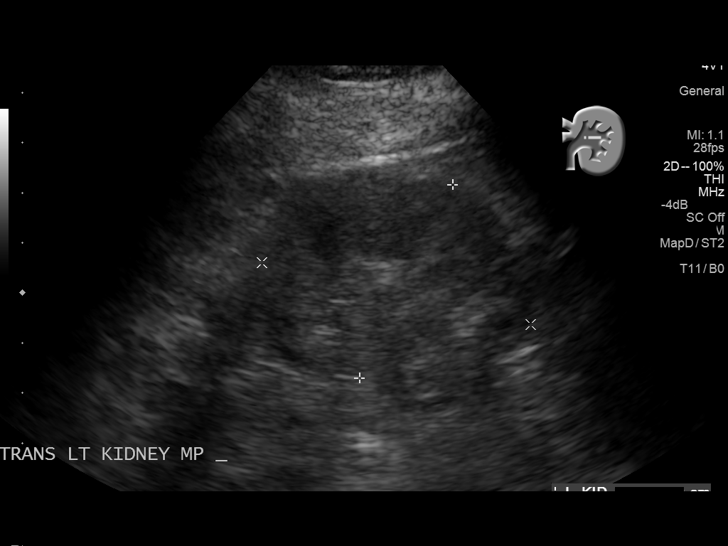
[im 42/56]
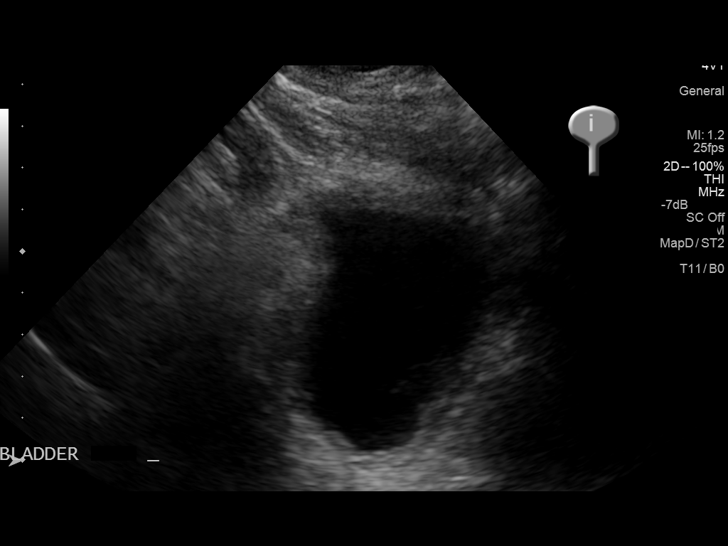
[im 46/56]
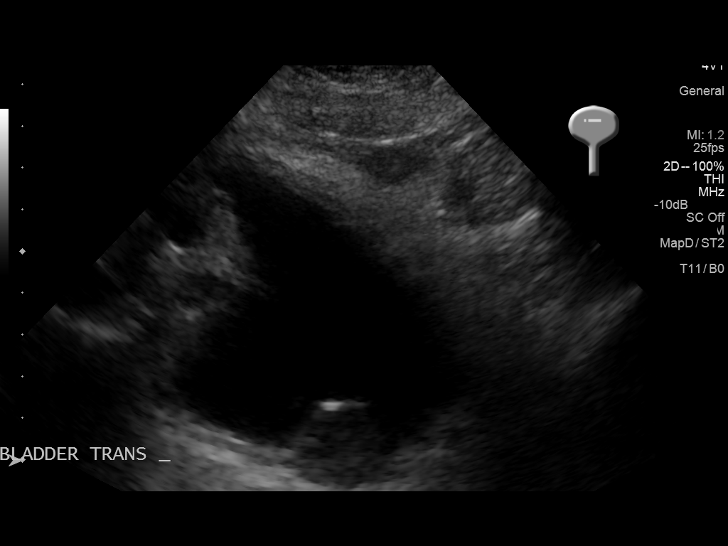
[im 51/56]
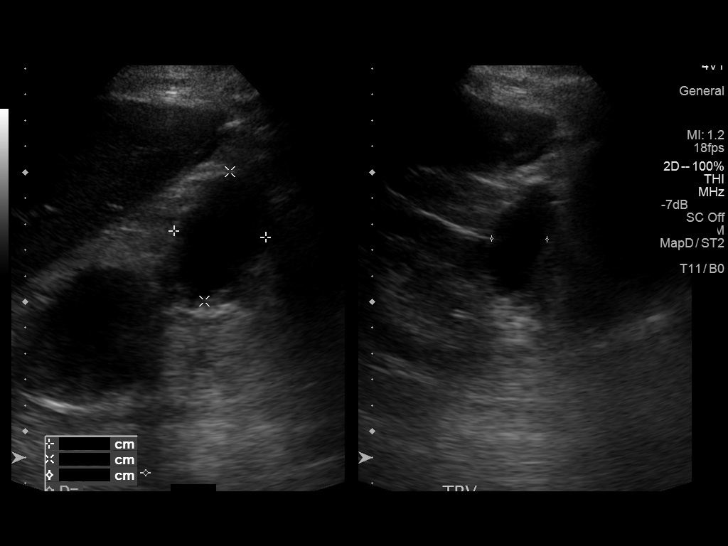
[im 56/56]
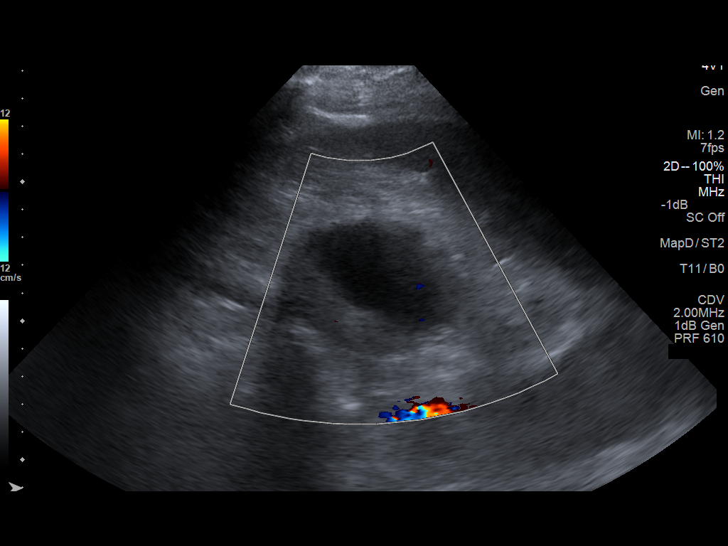

[14 of 25 positions shown; findings below may reference images not displayed]

FINDINGS: Right Kidney:

Length: 13.2 cm. Increased renal cortical echogenicity. 6.3 x 6.5 x
7.6 cm anechoic right lower pole renal mass with increased through
transmission most consistent with a cyst. 3.6 x 5.1 x 2.1 cm
anechoic right mid pole renal mass with increased through
transmission most consistent with a cyst. 3.9 x 5.4 x 5.5 cm
hypoechoic right upper pole renal mass with debris layering
dependently without internal Doppler flow most consistent with a
mildly complicated cyst. No solid mass or hydronephrosis visualized.

Left Kidney:

Length: 9.3 cm. Echogenicity within normal limits. No mass or
hydronephrosis visualized.

Bladder:

Appears normal for degree of bladder distention.
IMPRESSION: 1. No obstructive uropathy.
2. Mildly complicated cyst in the upper pole of the right kidney.
Follow-up renal ultrasound in 6 months recommended.
3. Multiple right renal simple cysts.

## 2016-11-30 IMAGING — CR DG CHEST 2V
2 series · 4 of 4 positions shown · non-contrast
Comparison: 10/21/2014

CLINICAL DATA: Fever, cough, diarrhea

EXAM:
CHEST  2 VIEW

[Series 2: chest lat · 0.14mm/px · 2 of 2 slices shown]
[im 1/2]
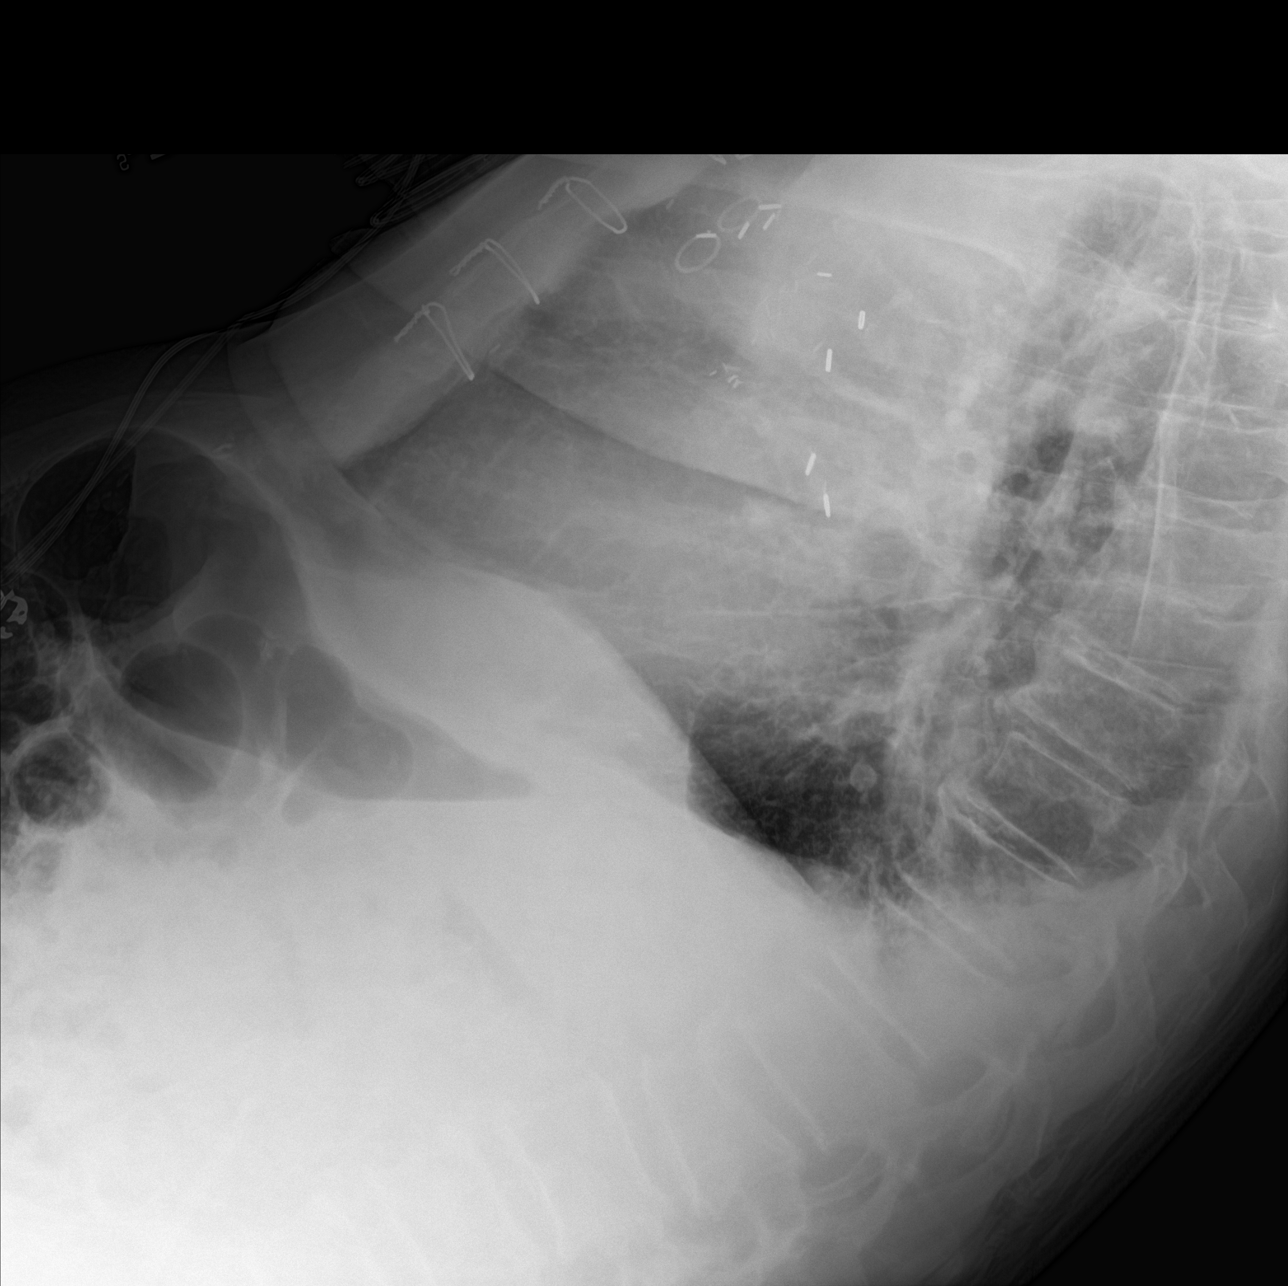
[im 2/2]
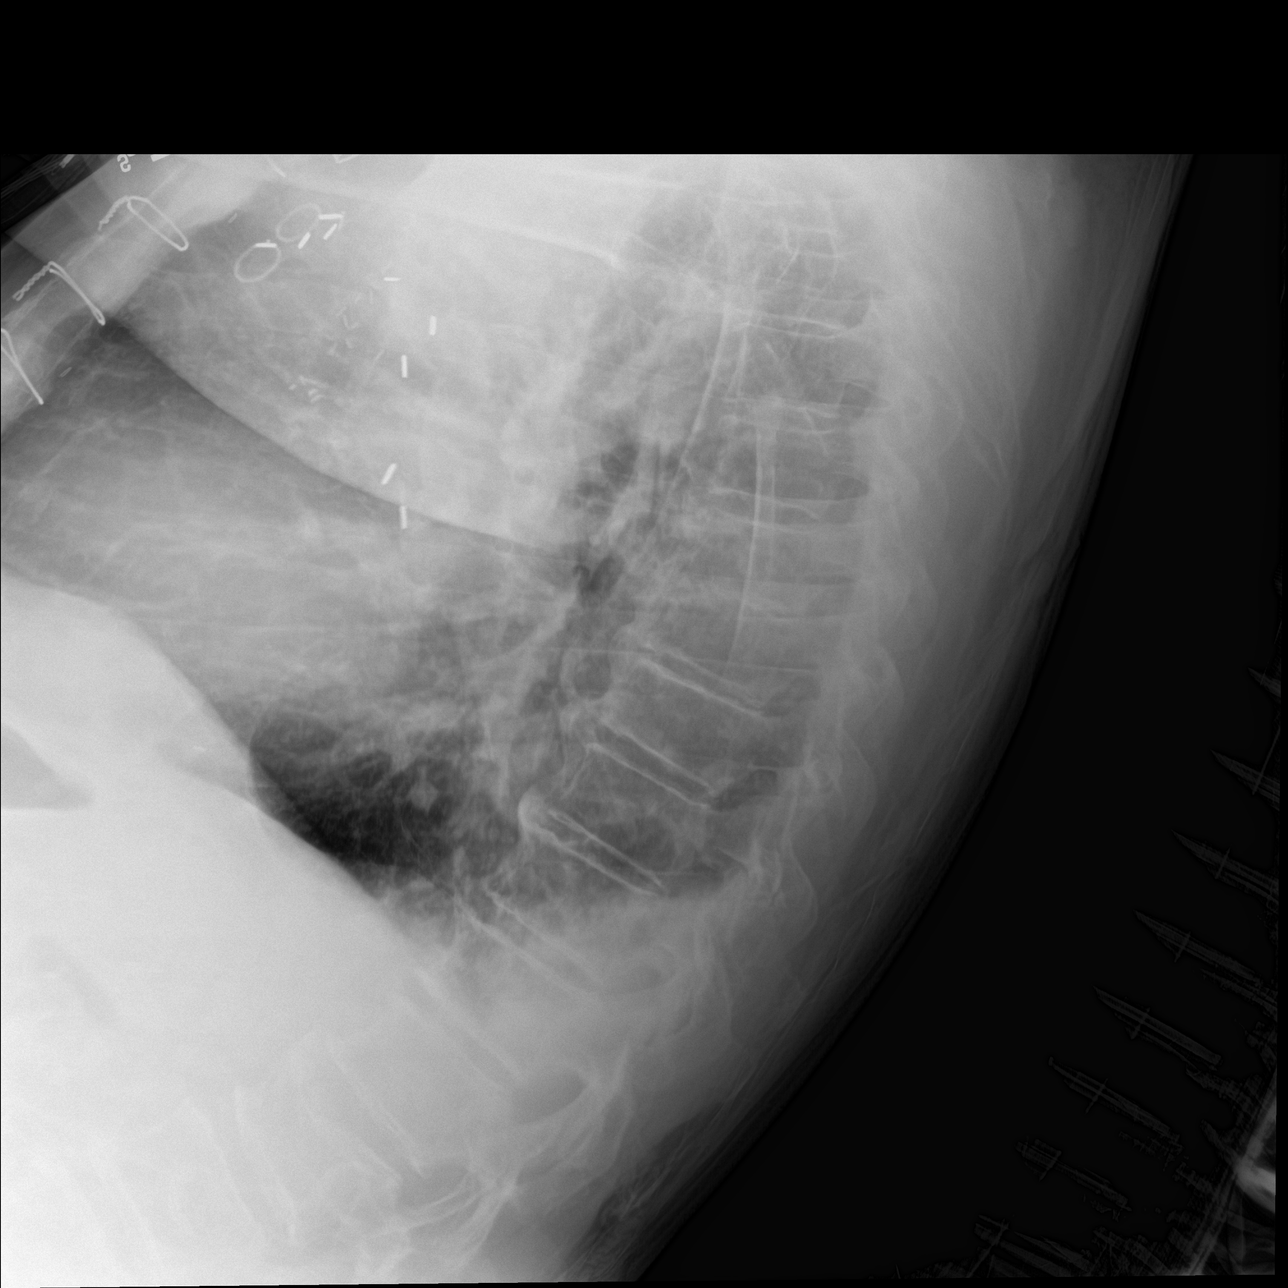

[Series 3: chest ap · 0.14mm/px · 2 of 2 slices shown]
[im 1/2]
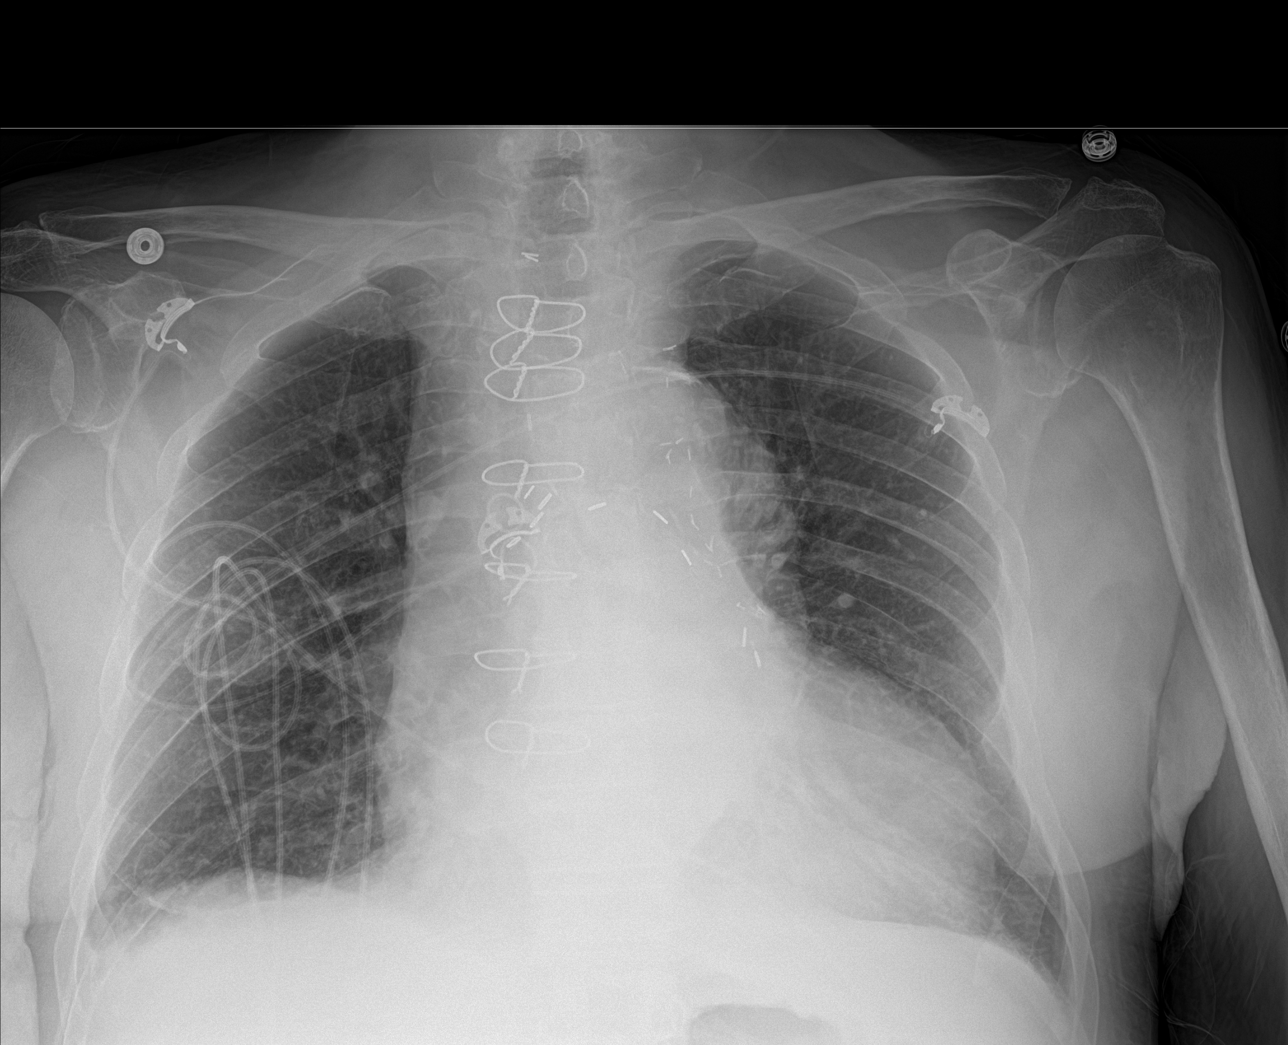
[im 2/2]
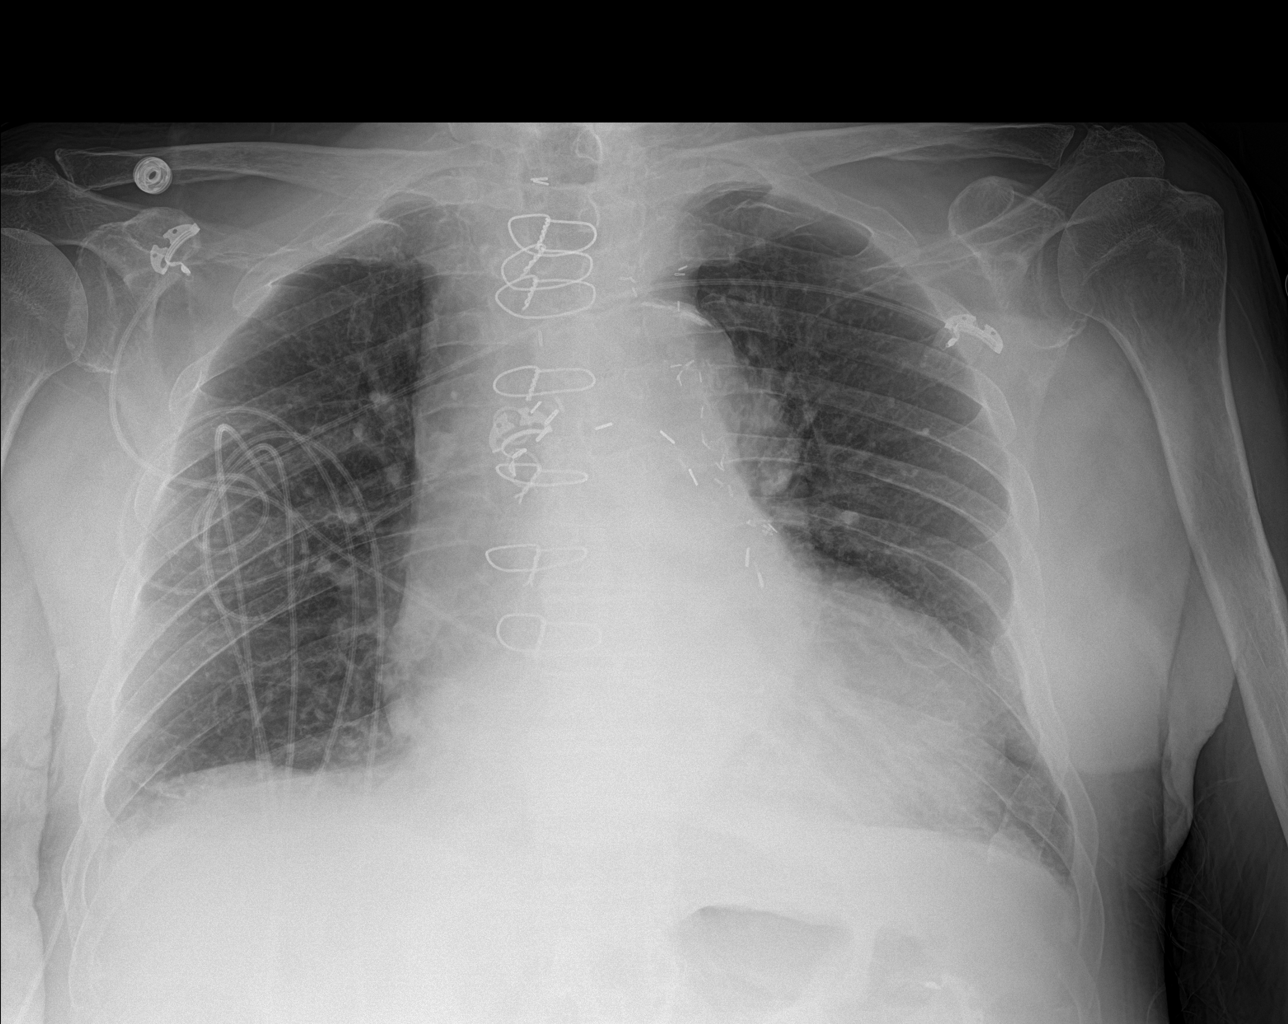

[4 of 4 positions shown; findings below may reference images not displayed]

FINDINGS: Lungs are clear.  Small right pleural effusion.  No pneumothorax.

Cardiomegaly.  Postsurgical changes related to prior CABG.
IMPRESSION: Small right pleural effusion.

## 2016-12-01 IMAGING — RF DG ESOPHAGUS
5 series · 14 of 14 positions shown · non-contrast
Comparison: None.

CLINICAL DATA: [AGE] male with difficulty swallowing for the
past 2 months. Initial encounter.

EXAM:
ESOPHOGRAM/BARIUM SWALLOW
TECHNIQUE: Single contrast examination was performed using  thin barium.
FLUOROSCOPY TIME:  Radiation Exposure Index (as provided by the
fluoroscopic device): 2365.74 micro Gy cm2

[Series 1: cp_standard · 0.27mm/px · 1 of 1 slices shown (1 of 2)]
[im 1/1]
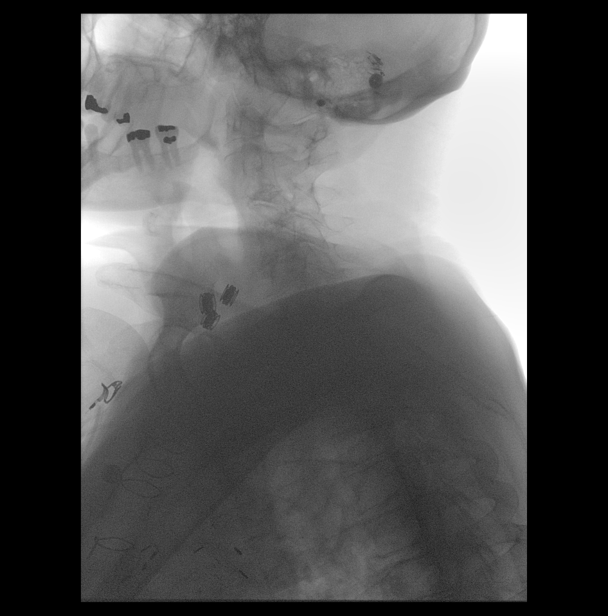

[Series 2: fluoro_barium 2fps_bw · 0.18mm/px · 4 of 20 frames shown (1 of 3)]
[frame 4/20]
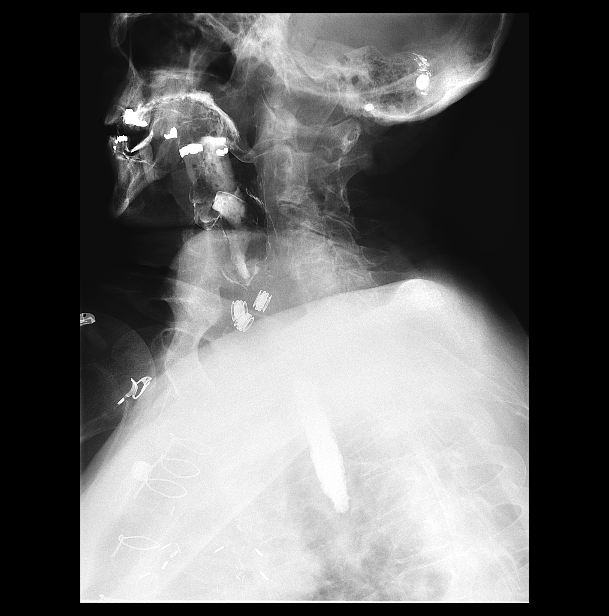
[frame 7/20]
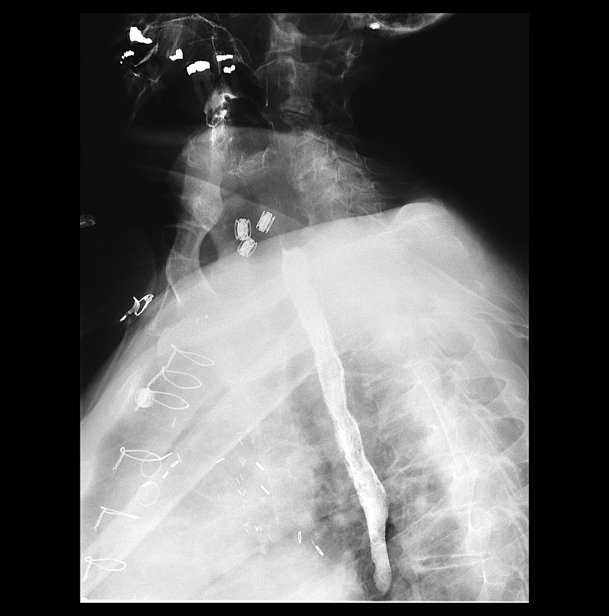
[frame 11/20]
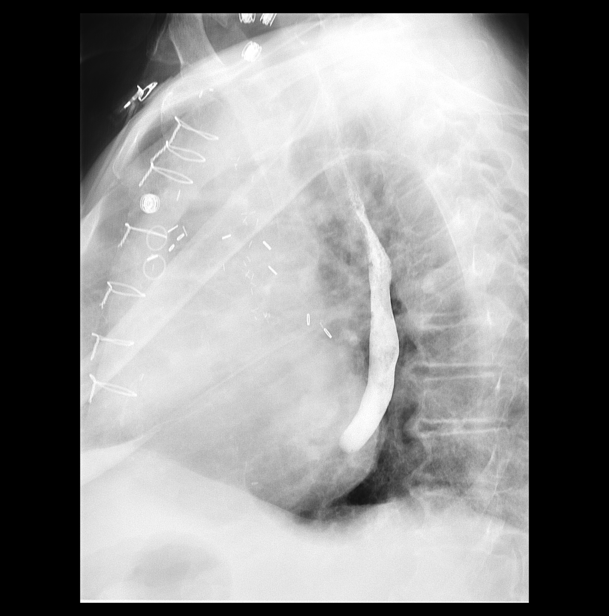
[frame 18/20]
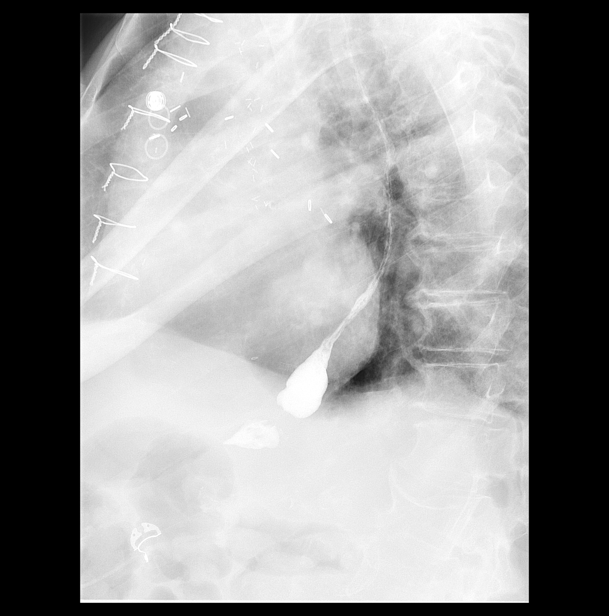

[Series 3: fluoro_barium 2fps_bw · 0.18mm/px · 4 of 20 frames shown (2 of 3)]
[frame 4/20]
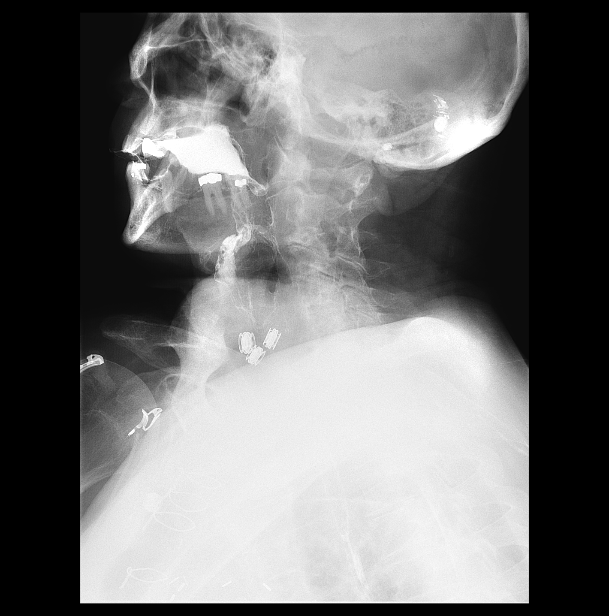
[frame 10/20]
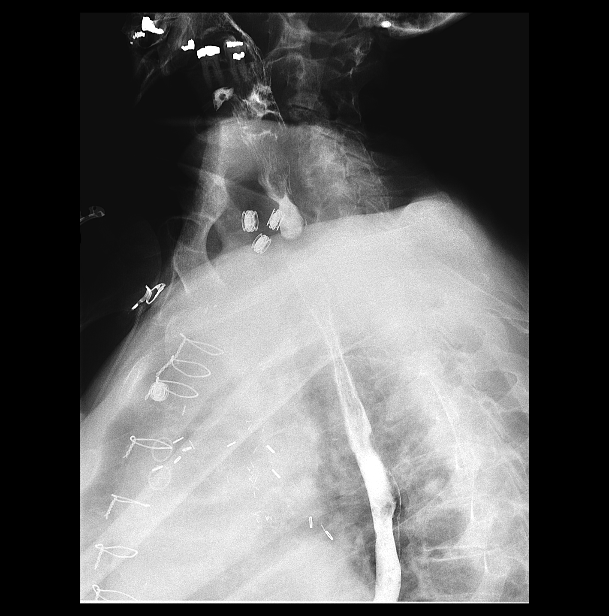
[frame 11/20]
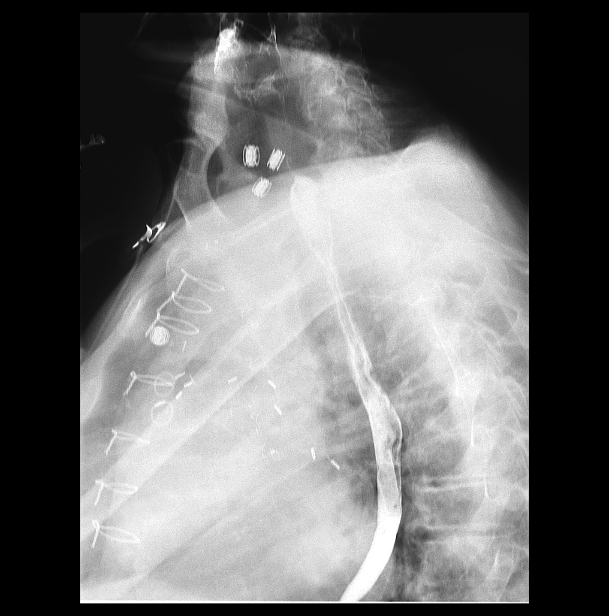
[frame 18/20]
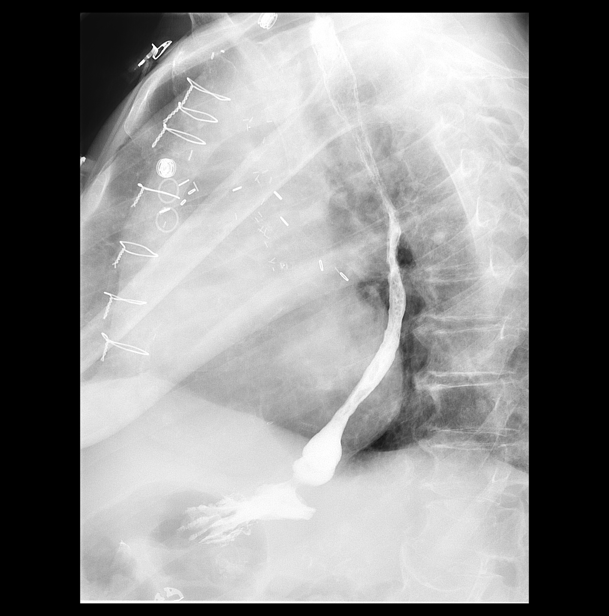

[Series 4: cp_standard · 0.27mm/px · 1 of 1 slices shown (2 of 2)]
[im 1/1]
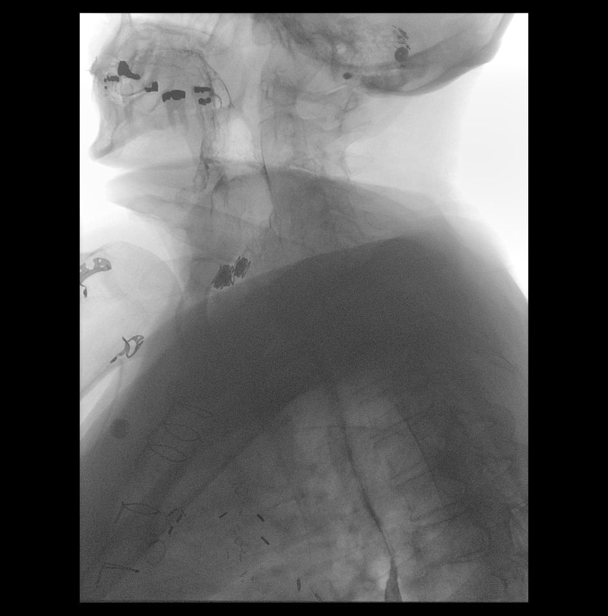

[Series 5: fluoro_barium 2fps_bw · 0.18mm/px · 4 of 16 frames shown (3 of 3)]
[frame 3/16]
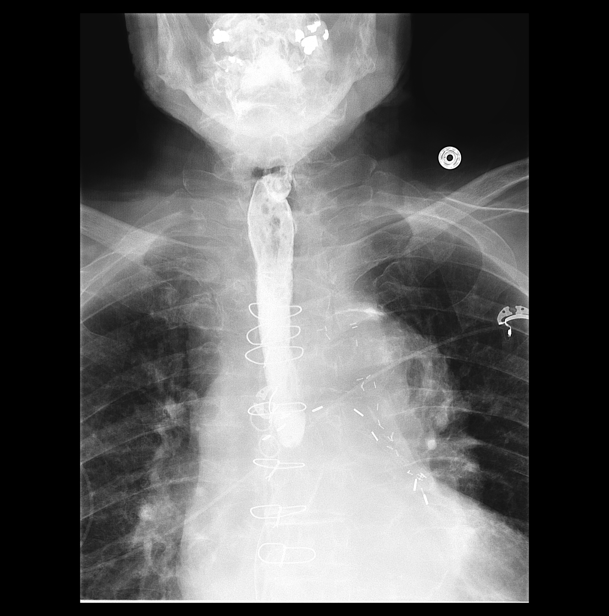
[frame 6/16]
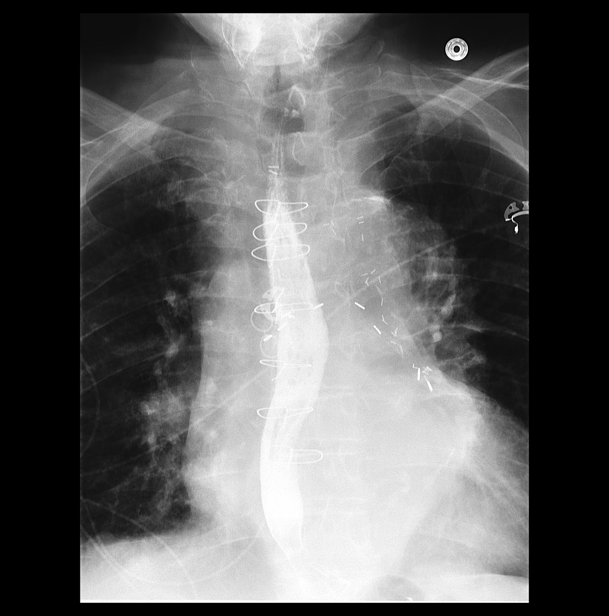
[frame 9/16]
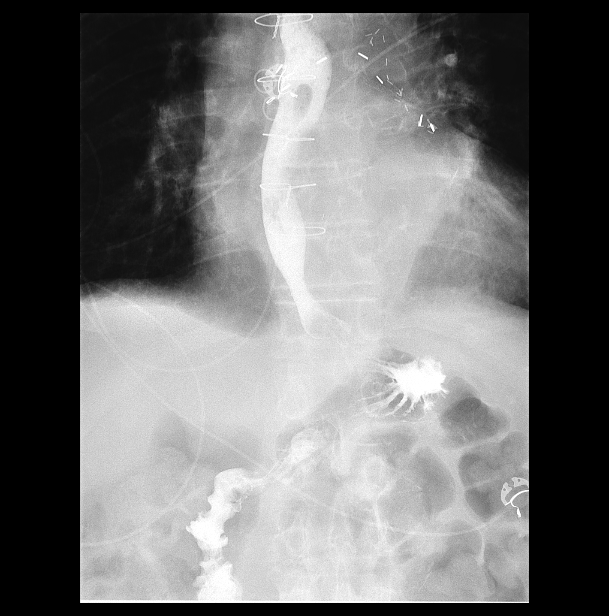
[frame 14/16]
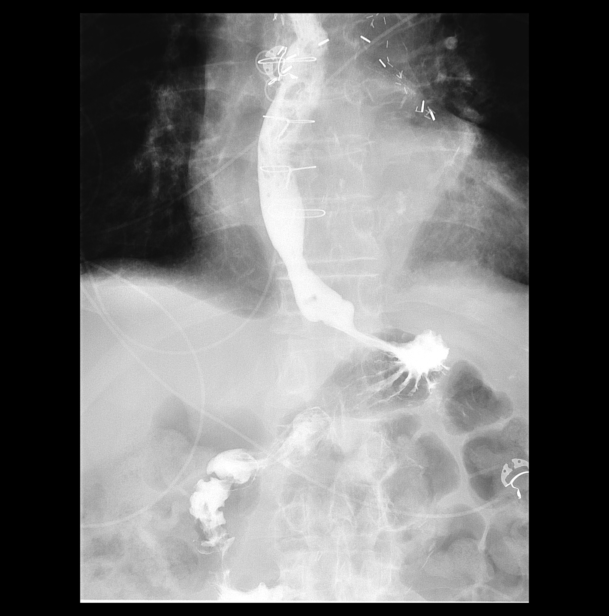

[14 of 14 positions shown; findings below may reference images not displayed]

FINDINGS: No laryngeal penetration or aspiration.

Poor primary esophageal stripping wave.

No esophageal obstructing lesion.

Very small sliding-type hilar hernia with mild smooth narrowing just
above this region.

A barium tablet was not ingested secondary to the fact that the
patient was in the supine position and may have had difficulty
swallowing the pill.
IMPRESSION: No laryngeal penetration or aspiration.

Poor primary esophageal stripping wave.

Very small sliding-type hilar hernia with mild smooth narrowing just
above this region.
